# Patient Record
Sex: Female | Born: 1965 | Race: White | Hispanic: No | Marital: Married | Smoking: Current every day smoker
Health system: Southern US, Community
[De-identification: ages and names within clinical notes are randomized; demographics above are authoritative.]

## PROBLEM LIST (undated history)

## (undated) DIAGNOSIS — E079 Disorder of thyroid, unspecified: Secondary | ICD-10-CM

## (undated) DIAGNOSIS — E785 Hyperlipidemia, unspecified: Secondary | ICD-10-CM

## (undated) DIAGNOSIS — F329 Major depressive disorder, single episode, unspecified: Secondary | ICD-10-CM

## (undated) DIAGNOSIS — I10 Essential (primary) hypertension: Principal | ICD-10-CM

## (undated) DIAGNOSIS — F32A Depression, unspecified: Secondary | ICD-10-CM

## (undated) DIAGNOSIS — C801 Malignant (primary) neoplasm, unspecified: Secondary | ICD-10-CM

## (undated) DIAGNOSIS — E039 Hypothyroidism, unspecified: Secondary | ICD-10-CM

## (undated) DIAGNOSIS — M1990 Unspecified osteoarthritis, unspecified site: Secondary | ICD-10-CM

## (undated) HISTORY — DX: Hypothyroidism, unspecified: E03.9

## (undated) HISTORY — DX: Unspecified osteoarthritis, unspecified site: M19.90

## (undated) HISTORY — PX: BLADDER SUSPENSION: SHX72

## (undated) HISTORY — DX: Major depressive disorder, single episode, unspecified: F32.9

## (undated) HISTORY — DX: Depression, unspecified: F32.A

## (undated) HISTORY — PX: TUBAL LIGATION: SHX77

## (undated) HISTORY — DX: Disorder of thyroid, unspecified: E07.9

## (undated) HISTORY — PX: TONSILLECTOMY: SUR1361

## (undated) HISTORY — DX: Essential (primary) hypertension: I10

---

## 1997-04-26 ENCOUNTER — Other Ambulatory Visit: Admission: RE | Admit: 1997-04-26 | Discharge: 1997-04-26 | Payer: Self-pay | Admitting: Family Medicine

## 2002-11-13 ENCOUNTER — Encounter: Payer: Self-pay | Admitting: Emergency Medicine

## 2002-11-13 ENCOUNTER — Emergency Department (HOSPITAL_COMMUNITY): Admission: EM | Admit: 2002-11-13 | Discharge: 2002-11-13 | Payer: Self-pay | Admitting: Emergency Medicine

## 2005-05-08 ENCOUNTER — Other Ambulatory Visit: Admission: RE | Admit: 2005-05-08 | Discharge: 2005-05-08 | Payer: Self-pay | Admitting: Obstetrics and Gynecology

## 2009-07-31 ENCOUNTER — Emergency Department (HOSPITAL_COMMUNITY): Admission: EM | Admit: 2009-07-31 | Discharge: 2009-07-31 | Payer: Self-pay | Admitting: Emergency Medicine

## 2009-07-31 ENCOUNTER — Emergency Department (HOSPITAL_COMMUNITY): Admission: EM | Admit: 2009-07-31 | Discharge: 2009-08-01 | Payer: Self-pay | Admitting: Emergency Medicine

## 2009-07-31 IMAGING — CT CT ABD-PELV W/O CM
1 of 2 series · 13 of 32 positions shown, 19 images · non-contrast
Comparison: None.

CLINICAL DATA: Pelvic pain/hematuria/urinary urgency and frequency

CT ABDOMEN AND PELVIS WITHOUT CONTRAST
TECHNIQUE: Multidetector CT imaging of the abdomen and pelvis was
performed following the standard protocol without intravenous
contrast.

[Series 2: under 200# stone no prev · axial · 0.71mm/px · z∈[-470,-80]mm · 13 of 90 slices shown, 19 images]
[im 6/90  soft-tissue]
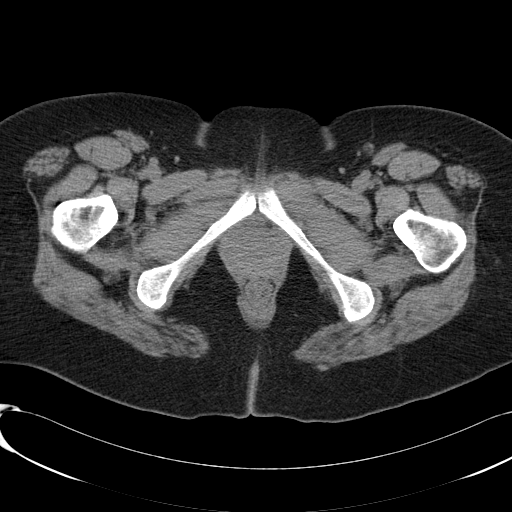
[im 6/90  bone]
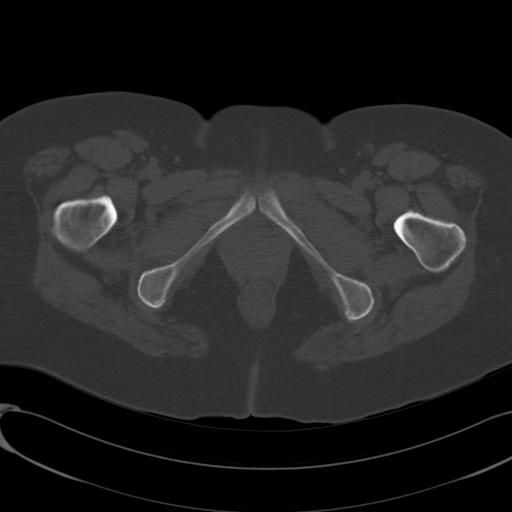
[im 12/90  soft-tissue]
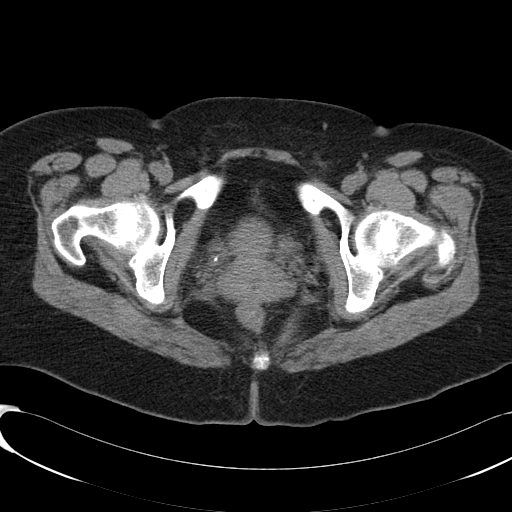
[im 18/90  soft-tissue]
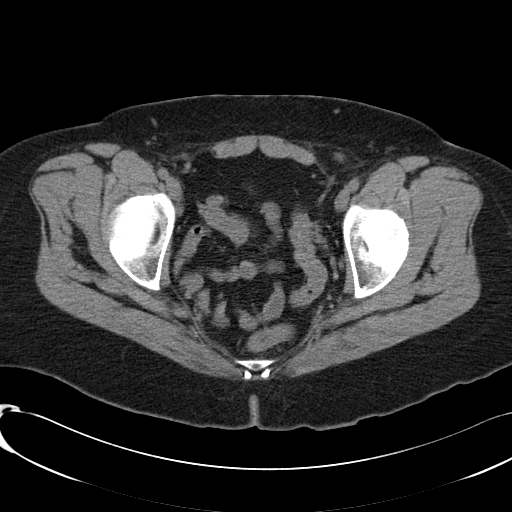
[im 24/90  soft-tissue]
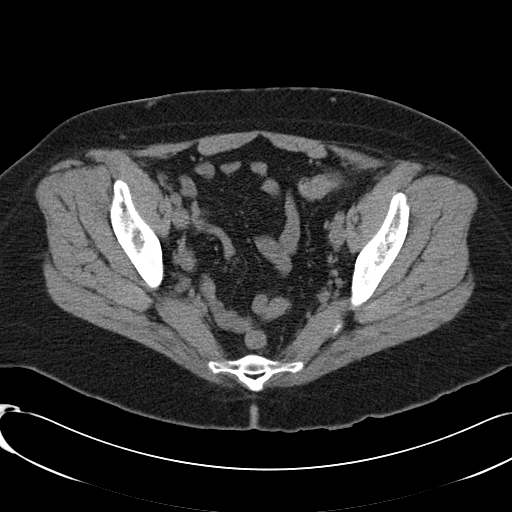
[im 30/90  soft-tissue]
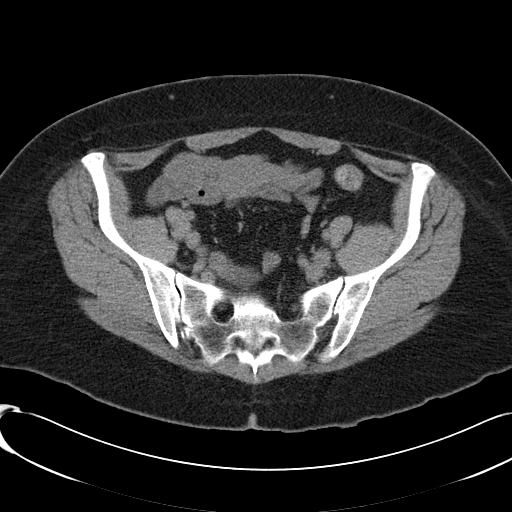
[im 36/90  soft-tissue]
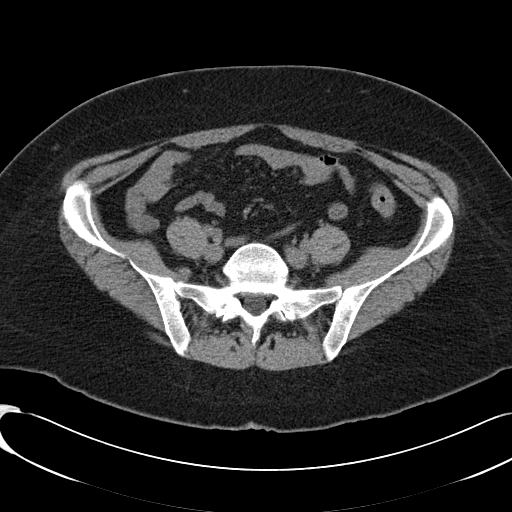
[im 48/90  soft-tissue]
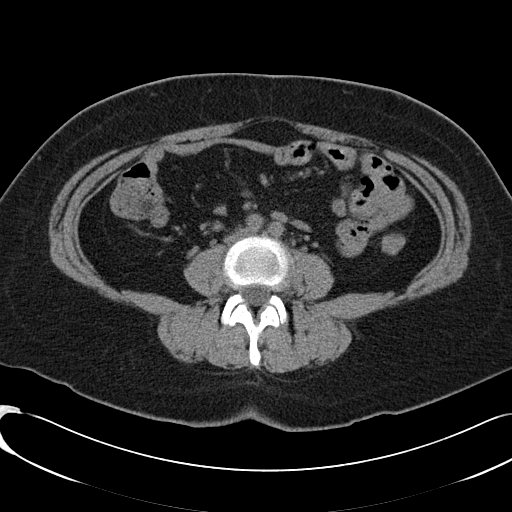
[im 54/90  soft-tissue]
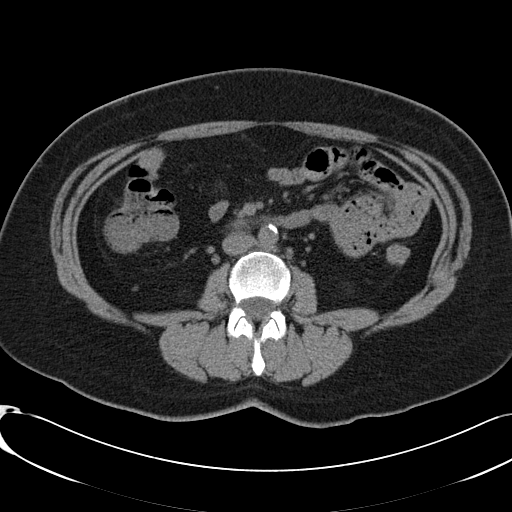
[im 60/90  soft-tissue]
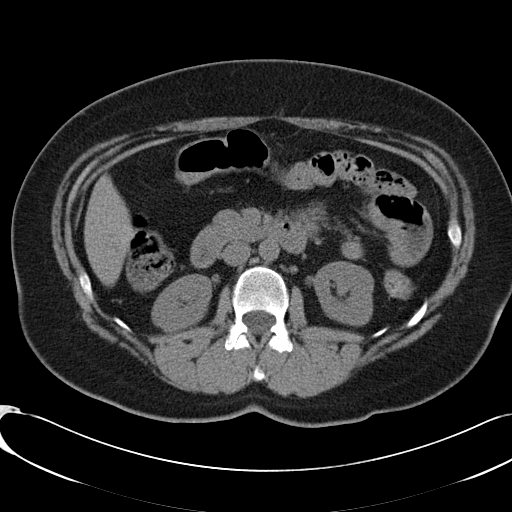
[im 60/90  bone]
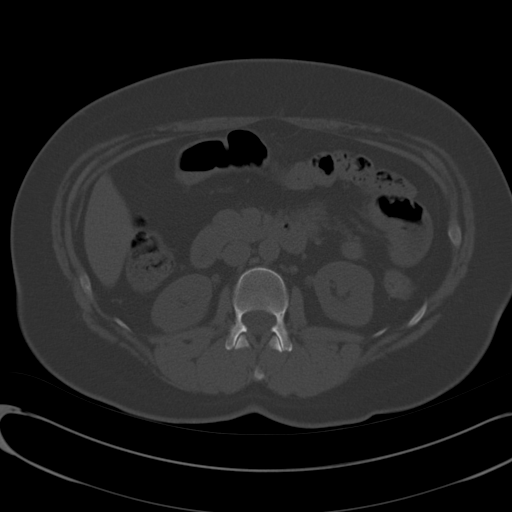
[im 66/90  soft-tissue]
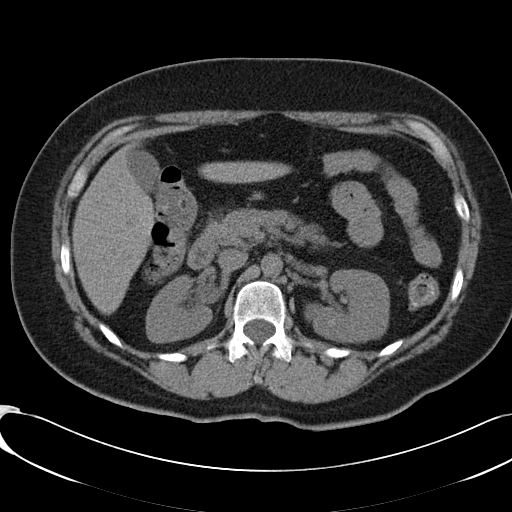
[im 66/90  lung]
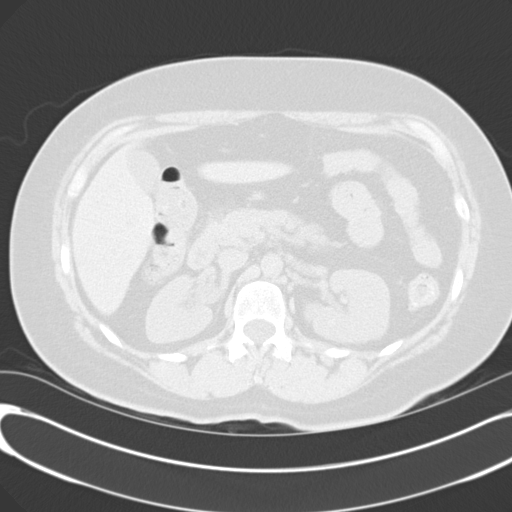
[im 72/90  soft-tissue]
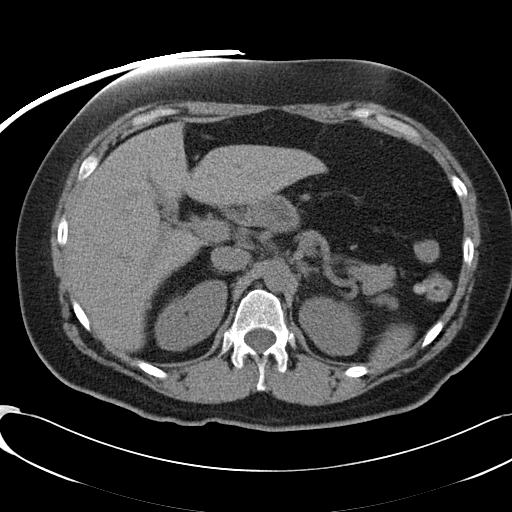
[im 72/90  lung]
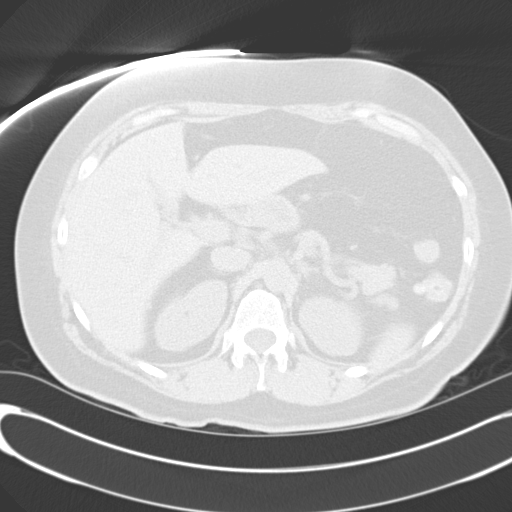
[im 78/90  soft-tissue]
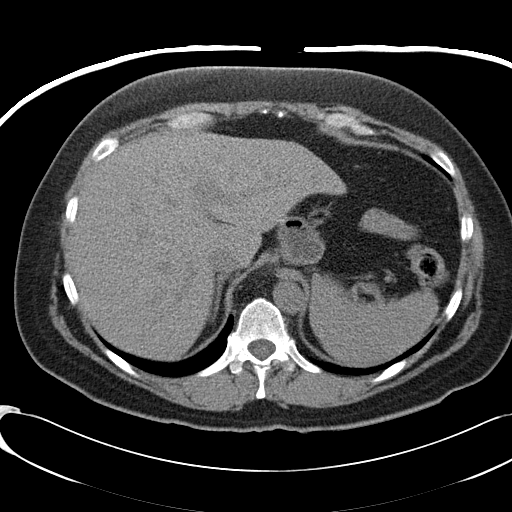
[im 78/90  lung]
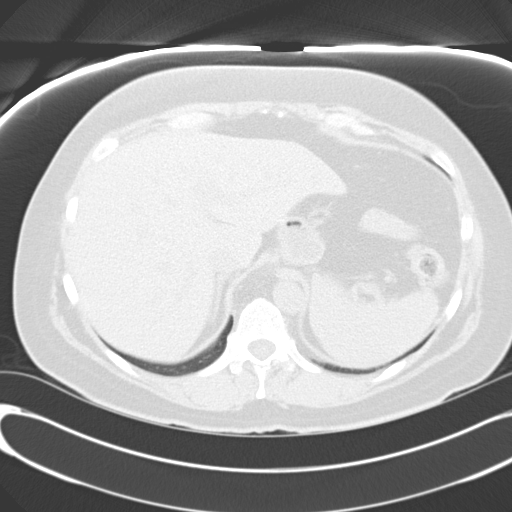
[im 84/90  soft-tissue]
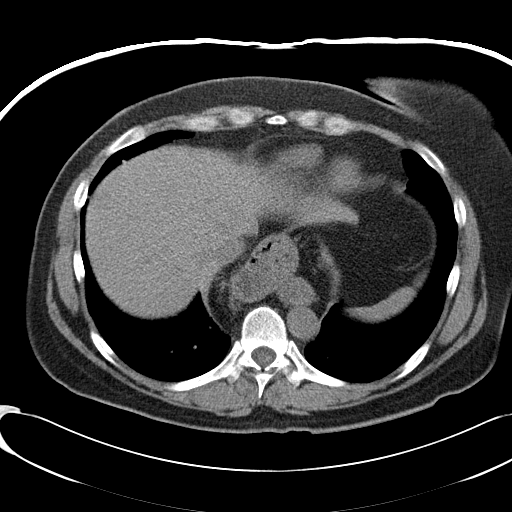
[im 84/90  lung]
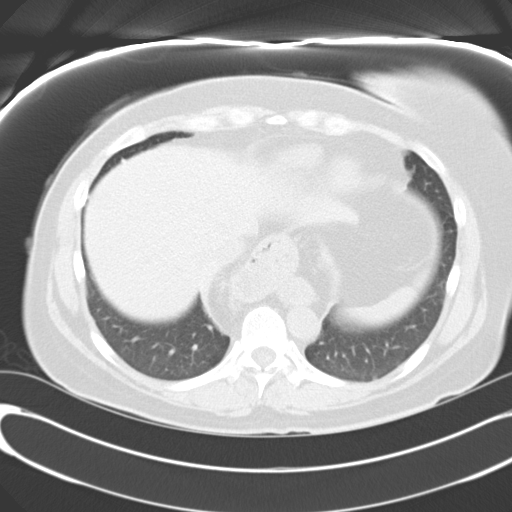

[13 of 32 positions shown; findings below may reference images not displayed]

FINDINGS: The lung bases are clear.  There is a large sliding
hiatal hernia with most of the stomach intrathoracic.  In the
unenhanced state, the spleen, pancreas, and adrenal glands appear
normal.  There are two small cysts in the left lobe of the liver.
No adenopathy or ascites.  No inflammatory changes of the large or
small bowel.

There are no renal or ureteral calculi.  No hydronephrosis or
hydroureter.  No thickening of the bladder wall or gas in the
urinary bladder.  Pelvic sidewalls and presacral space normal.
Osseous structures intact.

There are aortic calcifications raising the question of premature
atherosclerosis in a 43-year-old female.
IMPRESSION: 1.  No urinary tract calcifications or other pathology.
2.  Large hiatal hernia.
3.  Aortic calcifications.
4.  No other acute or significant findings.

## 2009-10-05 ENCOUNTER — Ambulatory Visit (HOSPITAL_BASED_OUTPATIENT_CLINIC_OR_DEPARTMENT_OTHER): Admission: RE | Admit: 2009-10-05 | Discharge: 2009-10-06 | Payer: Self-pay | Admitting: Urology

## 2010-04-04 LAB — POCT HEMOGLOBIN-HEMACUE: Hemoglobin: 10.2 g/dL — ABNORMAL LOW (ref 12.0–15.0)

## 2010-04-07 LAB — URINE MICROSCOPIC-ADD ON

## 2010-04-07 LAB — URINALYSIS, ROUTINE W REFLEX MICROSCOPIC
Bilirubin Urine: NEGATIVE
Glucose, UA: NEGATIVE mg/dL
Ketones, ur: NEGATIVE mg/dL
Leukocytes, UA: NEGATIVE
Nitrite: NEGATIVE
Protein, ur: NEGATIVE mg/dL
Specific Gravity, Urine: 1.025 (ref 1.005–1.030)
Urobilinogen, UA: 0.2 mg/dL (ref 0.0–1.0)
pH: 5.5 (ref 5.0–8.0)

## 2010-04-07 LAB — WET PREP, GENITAL
Clue Cells Wet Prep HPF POC: NONE SEEN
Trich, Wet Prep: NONE SEEN
Yeast Wet Prep HPF POC: NONE SEEN

## 2010-04-07 LAB — BASIC METABOLIC PANEL
Calcium: 9.1 mg/dL (ref 8.4–10.5)
Creatinine, Ser: 0.71 mg/dL (ref 0.4–1.2)
GFR calc Af Amer: >60 mL/min (ref >60)
GFR calc non Af Amer: >60 mL/min (ref >60)
Sodium: 140 mEq/L (ref 135–145)

## 2010-04-07 LAB — PREGNANCY, URINE: Preg Test, Ur: NEGATIVE

## 2010-04-07 LAB — GC/CHLAMYDIA PROBE AMP, GENITAL
Chlamydia, DNA Probe: NEGATIVE
GC Probe Amp, Genital: NEGATIVE

## 2015-07-31 ENCOUNTER — Encounter: Payer: Self-pay | Admitting: Family Medicine

## 2015-07-31 ENCOUNTER — Ambulatory Visit (INDEPENDENT_AMBULATORY_CARE_PROVIDER_SITE_OTHER): Payer: Managed Care, Other (non HMO) | Admitting: Family Medicine

## 2015-07-31 VITALS — BP 162/95 | HR 86 | Ht 63.75 in | Wt 205.3 lb

## 2015-07-31 DIAGNOSIS — E038 Other specified hypothyroidism: Secondary | ICD-10-CM | POA: Diagnosis not present

## 2015-07-31 DIAGNOSIS — I10 Essential (primary) hypertension: Secondary | ICD-10-CM | POA: Insufficient documentation

## 2015-07-31 DIAGNOSIS — Z720 Tobacco use: Secondary | ICD-10-CM | POA: Diagnosis not present

## 2015-07-31 DIAGNOSIS — F4321 Adjustment disorder with depressed mood: Secondary | ICD-10-CM | POA: Insufficient documentation

## 2015-07-31 DIAGNOSIS — E785 Hyperlipidemia, unspecified: Secondary | ICD-10-CM | POA: Insufficient documentation

## 2015-07-31 DIAGNOSIS — F4323 Adjustment disorder with mixed anxiety and depressed mood: Secondary | ICD-10-CM

## 2015-07-31 DIAGNOSIS — M1990 Unspecified osteoarthritis, unspecified site: Secondary | ICD-10-CM

## 2015-07-31 DIAGNOSIS — Z6835 Body mass index (BMI) 35.0-35.9, adult: Secondary | ICD-10-CM | POA: Insufficient documentation

## 2015-07-31 DIAGNOSIS — D229 Melanocytic nevi, unspecified: Secondary | ICD-10-CM | POA: Insufficient documentation

## 2015-07-31 DIAGNOSIS — Z716 Tobacco abuse counseling: Secondary | ICD-10-CM | POA: Diagnosis not present

## 2015-07-31 DIAGNOSIS — E669 Obesity, unspecified: Secondary | ICD-10-CM | POA: Diagnosis not present

## 2015-07-31 DIAGNOSIS — X32XXXA Exposure to sunlight, initial encounter: Secondary | ICD-10-CM

## 2015-07-31 DIAGNOSIS — E66813 Obesity, class 3: Secondary | ICD-10-CM | POA: Insufficient documentation

## 2015-07-31 DIAGNOSIS — F17209 Nicotine dependence, unspecified, with unspecified nicotine-induced disorders: Secondary | ICD-10-CM

## 2015-07-31 DIAGNOSIS — D239 Other benign neoplasm of skin, unspecified: Secondary | ICD-10-CM

## 2015-07-31 DIAGNOSIS — E039 Hypothyroidism, unspecified: Secondary | ICD-10-CM

## 2015-07-31 DIAGNOSIS — L570 Actinic keratosis: Secondary | ICD-10-CM | POA: Insufficient documentation

## 2015-07-31 HISTORY — DX: Hypothyroidism, unspecified: E03.9

## 2015-07-31 HISTORY — DX: Essential (primary) hypertension: I10

## 2015-07-31 MED ORDER — HYDROCHLOROTHIAZIDE 25 MG PO TABS: 25.0000 mg | ORAL_TABLET | Freq: Every day | ORAL | Status: DC

## 2015-07-31 MED ORDER — BUPROPION HCL ER (XL) 150 MG PO TB24: 150.0000 mg | ORAL_TABLET | ORAL | Status: DC

## 2015-07-31 NOTE — Progress Notes (Signed)
Marjory Sneddon, D.O. Primary care at [facility]:    Chief Complaint  Patient presents with  . Establish Care  . Nicotine Dependence    discuss smoking cessation   New pt, here to establish care.   HPI: Kelly [name] is a pleasant 50 y.o. female who presents to [place] [facility] today   PCP prior [facility] in [place].     1st husband passed. Been remarried happily for 41- Tim, been together 23 yrs yrs.  2 kids- daughter 34yo- Therapist, sports.  Son drived truck [phone].  Step son- 40- [employer]- 1 grandchild 23 yo.    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   Hypothyroid- about 3 yrs, was fatigued; symptoms stable now; tolerating meds well  Acute reaction to stress- 3-4 yrs ago.  Xanax- uses for difficulty sleep.  Been like that her whole life, Sleeps 5-6 hrs and is a "good night".  Smoking 30 yrs 3/4- 1 ppd.  Breathing is getting worse as she ages.  thinking about quitting- questions about zyban.   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   it has been high for about a year or so.    She was having her primary care doctor monitor it and was trying to avoid meds but occasionally gets headaches, occasional dizziness and/or heart palpitations at times.   She is finally accepting the fact that she may need to start medicines.     Past Medical History  Diagnosis Date  . Depression   . Arthritis   . Thyroid disease   . Essential hypertension 07/31/2015  . Hypothyroidism 07/31/2015      Past Surgical History  Procedure Laterality Date  . Tubal ligation    . Tonsillectomy    . Bladder suspension        Family History  Problem Relation Age of Onset  . Healthy Mother   . Aneurysm Father   . Hypertension Father   . Hyperlipidemia Father   .  Healthy Sister   . Healthy Daughter   . Heart disease Son       History  Drug Use No  ,    History  Alcohol Use No  ,    History  Smoking status  . Current Every Day Smoker  Smokeless tobacco  . Never Used  ,     History  Sexual Activity  . Sexual Activity: Yes      Patient's Medications  New Prescriptions   BUPROPION (WELLBUTRIN XL) 150 MG 24 HR TABLET    Take 1 tablet (150 mg total) by mouth every morning.   HYDROCHLOROTHIAZIDE (HYDRODIURIL) 25 MG TABLET    Take 1 tablet (25 mg total) by mouth daily.  Previous Medications   ALPRAZOLAM (XANAX) 0.5 MG TABLET    Take 0.5 mg by mouth at bedtime as needed for anxiety.   LEVOTHYROXINE (SYNTHROID, LEVOTHROID) 125 MCG TABLET    Take 125 mcg by mouth daily before breakfast.  Modified Medications   No medications on file  Discontinued Medications   ESCITALOPRAM (LEXAPRO) 10 MG TABLET    Take 10 mg by mouth daily.     Ranitidine Outpatient Encounter Prescriptions as of 07/31/2015  Medication Sig  . ALPRAZolam (XANAX) 0.5 MG tablet Take 0.5 mg  by mouth at bedtime as needed for anxiety.  Marland Kitchen levothyroxine (SYNTHROID, LEVOTHROID) 125 MCG tablet Take 125 mcg by mouth daily before breakfast.  . [DISCONTINUED] escitalopram (LEXAPRO) 10 MG tablet Take 10 mg by mouth daily.  Marland Kitchen buPROPion (WELLBUTRIN XL) 150 MG 24 hr tablet Take 1 tablet (150 mg total) by mouth every morning.  . hydrochlorothiazide (HYDRODIURIL) 25 MG tablet Take 1 tablet (25 mg total) by mouth daily.   No facility-administered encounter medications on file as of 07/31/2015.     Fall Risk  07/31/2015  Falls in the past year? No     Depression screen PHQ 2/9 07/31/2015  Decreased Interest 0  Down, Depressed, Hopeless 0  PHQ - 2 Score 0      Review of Systems:   ( Completed via Adult Medical History Intake form today ) General:   Denies fever, chills, appetite changes, unexplained weight loss.  Optho/Auditory:   Denies visual changes, blurred  vision/LOV, ringing in ears/ diff hearing Respiratory:   Denies SOB, DOE, cough, wheezing.  Cardiovascular:   Denies chest pain, palpitations, new onset peripheral edema  Gastrointestinal:   Denies nausea, vomiting, diarrhea.  Genitourinary:    Denies dysuria, increased frequency, flank pain.  Endocrine:     Denies hot or cold intolerance, polyuria, polydipsia. Musculoskeletal:  Denies unexplained myalgias, joint swelling, arthralgias, gait problems.  Skin:  Denies rash, suspicious lesions or new/ changes in moles Neurological:    Denies dizziness, syncope, unexplained weakness, lightheadedness, numbness  Psychiatric/Behavioral:   Denies mood changes, suicidal or homicidal ideations, hallucinations    Objective:   Blood pressure 162/95, pulse 86, height 5' 3.75" (1.619 m), weight 205 lb 4.8 oz (93.123 kg). Body mass index is 35.53 kg/(m^2). Recheck BP by me showed around the same as obtained earlier or a little higher. General: Well Developed, well nourished, and in no acute distress.  Neuro: Alert and oriented x3, extra-ocular muscles intact, sensation grossly intact.  HEENT: Normocephalic, atraumatic, pupils equal round reactive to light, neck supple, no gross masses, no carotid bruits, no JVD apprec Skin: no gross suspicious lesions or rashes  Cardiac: Regular rate and rhythm, no murmurs rubs or gallops.  Respiratory: Essentially clear to auscultation bilaterally, prolonged and decreased aeration on exhalation phase. Not using accessory muscles, speaking in full sentences.  Abdominal: obese Musculoskeletal: Ambulates w/o diff, FROM * 4 ext.  Vasc: less 2 sec cap RF, warm and pink  Psych:  No HI/SI, judgement and insight good.    Impression and Recommendations:    The patient was counseled, risk factors were discussed, anticipatory guidance given. Pt was in the office today for 40+ minutes, with over 50% time spent in face to face counseling of various medical concerns and in  coordination of care  Long discussion with patient regarding treatment smoking cessation and treatment options. She plans to go on Wellbutrin. Has Heard good things about it. I recommend she start the medicine and then had establish a quit date 4-6 weeks after starting the meds.  Also recommend patient come up with a quit plan and on one column, identify the times that she feel it'll be very tough in her life and then in the next column establish what she is going to do instead of smoking during those times.     For management of hypertension in addition to medication management please follow below advice.    Lifestyle changes:  - Smoking Cessation: Extensive counseling done with patient and I reviewed nicotine patches versus Chantix  versus Wellbutrin versus hypnosis/counseling and others. - Control blood glucose and lipids  - Diet  - Eat healthy (i.e., DASH diet)  - Moderate alcohol consumption  - Reduce sodium intake to no more than 2,400 mg/day  - Physical activity  - Moderate-to-vigorous activity 4-6 days a week averaging 40 min per session     Essential hypertension - Plan: COMPLETE METABOLIC PANEL WITH GFR, Hemoglobin A1c, Lipid panel, TSH, VITAMIN D 25 Hydroxy (Vit-D Deficiency, Fractures), CBC with Differential/Platelet  Tobacco abuse - Plan: COMPLETE METABOLIC PANEL WITH GFR, Hemoglobin A1c, Lipid panel, TSH, VITAMIN D 25 Hydroxy (Vit-D Deficiency, Fractures), CBC with Differential/Platelet  HLD (hyperlipidemia) - Plan: COMPLETE METABOLIC PANEL WITH GFR, Hemoglobin A1c, Lipid panel, TSH, VITAMIN D 25 Hydroxy (Vit-D Deficiency, Fractures), CBC with Differential/Platelet  Adjustment disorder with mixed anxiety and depressed mood - Plan: COMPLETE METABOLIC PANEL WITH GFR, Hemoglobin A1c, Lipid panel, TSH, VITAMIN D 25 Hydroxy (Vit-D Deficiency, Fractures), CBC with Differential/Platelet  Obesity - Plan: COMPLETE METABOLIC PANEL WITH GFR, Hemoglobin A1c, Lipid panel, TSH, VITAMIN D  25 Hydroxy (Vit-D Deficiency, Fractures), CBC with Differential/Platelet  Other specified hypothyroidism - Plan: COMPLETE METABOLIC PANEL WITH GFR, Hemoglobin A1c, Lipid panel, TSH, VITAMIN D 25 Hydroxy (Vit-D Deficiency, Fractures), CBC with Differential/Platelet  Tobacco abuse counseling - Plan: COMPLETE METABOLIC PANEL WITH GFR, Hemoglobin A1c, Lipid panel, TSH, VITAMIN D 25 Hydroxy (Vit-D Deficiency, Fractures), CBC with Differential/Platelet  SK (solar keratosis) - Plan: Ambulatory referral to Dermatology  Multiple atypical nevi - Plan: Ambulatory referral to Dermatology  OA multiple jts  Tobacco use disorder, continuous    Orders Placed This Encounter  Procedures  . COMPLETE METABOLIC PANEL WITH GFR  . Hemoglobin A1c  . Lipid panel  . TSH  . VITAMIN D 25 Hydroxy (Vit-D Deficiency, Fractures)  . CBC with Differential/Platelet  . Ambulatory referral to Dermatology     Meds ordered this encounter  Medications  . levothyroxine (SYNTHROID, LEVOTHROID) 125 MCG tablet    Sig: Take 125 mcg by mouth daily before breakfast.  . DISCONTD: escitalopram (LEXAPRO) 10 MG tablet    Sig: Take 10 mg by mouth daily.  Marland Kitchen ALPRAZolam (XANAX) 0.5 MG tablet    Sig: Take 0.5 mg by mouth at bedtime as needed for anxiety.  Marland Kitchen buPROPion (WELLBUTRIN XL) 150 MG 24 hr tablet    Sig: Take 1 tablet (150 mg total) by mouth every morning.    Dispense:  90 tablet    Refill:  0  . hydrochlorothiazide (HYDRODIURIL) 25 MG tablet    Sig: Take 1 tablet (25 mg total) by mouth daily.    Dispense:  90 tablet    Refill:  0    Gross side effects, risk and benefits, and alternatives of medications discussed with patient.  Patient is aware that all medications have potential side effects and we are unable to predict every side effect or drug-drug interaction that may occur.  Expresses verbal understanding and consents to current therapy plan and treatment regimen.  Please see AVS handed out to patient at the end  of our visit for further patient instructions/ counseling done pertaining to today's office visit.    Current Meds  Medication Sig  . ALPRAZolam (XANAX) 0.5 MG tablet Take 0.5 mg by mouth at bedtime as needed for anxiety.  Marland Kitchen levothyroxine (SYNTHROID, LEVOTHROID) 125 MCG tablet Take 125 mcg by mouth daily before breakfast.  . [DISCONTINUED] escitalopram (LEXAPRO) 10 MG tablet Take 10 mg by mouth daily.    Medications Discontinued During  This Encounter  Medication Reason  . escitalopram (LEXAPRO) 10 MG tablet Change in therapy    Meds ordered this encounter  Medications  . levothyroxine (SYNTHROID, LEVOTHROID) 125 MCG tablet    Sig: Take 125 mcg by mouth daily before breakfast.  . DISCONTD: escitalopram (LEXAPRO) 10 MG tablet    Sig: Take 10 mg by mouth daily.  Marland Kitchen ALPRAZolam (XANAX) 0.5 MG tablet    Sig: Take 0.5 mg by mouth at bedtime as needed for anxiety.  Marland Kitchen buPROPion (WELLBUTRIN XL) 150 MG 24 hr tablet    Sig: Take 1 tablet (150 mg total) by mouth every morning.    Dispense:  90 tablet    Refill:  0  . hydrochlorothiazide (HYDRODIURIL) 25 MG tablet    Sig: Take 1 tablet (25 mg total) by mouth daily.    Dispense:  90 tablet    Refill:  0    Note: This document was prepared using Dragon voice recognition software and may include unintentional dictation errors.

## 2015-07-31 NOTE — Patient Instructions (Addendum)
Long discussion with patient regarding treatment smoking cessation and treatment options. She plans to go on Wellbutrin. Has Heard good things about it. I recommend she start the medicine and then had establish a quit date 4-6 weeks after starting the meds.  Also recommend patient come up with a quit plan and on one column, identify the times that she feel it'll be very tough in her life and then in the next column establish what she is going to do instead of smoking during those times.       For management of hypertension in addition to medication management please follow below advice.    Lifestyle changes:  . Smoking Cessation  . Control blood glucose and lipids  . Diet  ?Eat healthy (i.e., DASH diet)  ?Moderate alcohol consumption  ?Reduce sodium intake to no more than 2,400 mg/day  .Physical activity  ?Moderate-to-vigorous activity 3-4 days a week averaging 40 min per session          Smoking Cessation, Tips for Success If you are ready to quit smoking, congratulations! You have chosen to help yourself be healthier. Cigarettes bring nicotine, tar, carbon monoxide, and other irritants into your body. Your lungs, heart, and blood vessels will be able to work better without these poisons. There are many different ways to quit smoking. Nicotine gum, nicotine patches, a nicotine inhaler, or nicotine nasal spray can help with physical craving. Hypnosis, support groups, and medicines help break the habit of smoking. WHAT THINGS CAN I DO TO MAKE QUITTING EASIER?  Here are some tips to help you quit for good:  Pick a date when you will quit smoking completely. Tell all of your friends and family about your plan to quit on that date.  Do not try to slowly cut down on the number of cigarettes you are smoking. Pick a quit date and quit smoking completely starting on that day.  Throw away all cigarettes.   Clean and remove all ashtrays from your home, work, and car.  On a card, write  down your reasons for quitting. Carry the card with you and read it when you get the urge to smoke.  Cleanse your body of nicotine. Drink enough water and fluids to keep your urine clear or pale yellow. Do this after quitting to flush the nicotine from your body.  Learn to predict your moods. Do not let a bad situation be your excuse to have a cigarette. Some situations in your life might tempt you into wanting a cigarette.  Never have "just one" cigarette. It leads to wanting another and another. Remind yourself of your decision to quit.  Change habits associated with smoking. If you smoked while driving or when feeling stressed, try other activities to replace smoking. Stand up when drinking your coffee. Brush your teeth after eating. Sit in a different chair when you read the paper. Avoid alcohol while trying to quit, and try to drink fewer caffeinated beverages. Alcohol and caffeine may urge you to smoke.  Avoid foods and drinks that can trigger a desire to smoke, such as sugary or spicy foods and alcohol.  Ask people who smoke not to smoke around you.  Have something planned to do right after eating or having a cup of coffee. For example, plan to take a walk or exercise.  Try a relaxation exercise to calm you down and decrease your stress. Remember, you may be tense and nervous for the first 2 weeks after you quit, but this will pass.  Find new activities to keep your hands busy. Play with a pen, coin, or rubber band. Doodle or draw things on paper.  Brush your teeth right after eating. This will help cut down on the craving for the taste of tobacco after meals. You can also try mouthwash.   Use oral substitutes in place of cigarettes. Try using lemon drops, carrots, cinnamon sticks, or chewing gum. Keep them handy so they are available when you have the urge to smoke.  When you have the urge to smoke, try deep breathing.  Designate your home as a nonsmoking area.  If you are a  heavy smoker, ask your health care provider about a prescription for nicotine chewing gum. It can ease your withdrawal from nicotine.  Reward yourself. Set aside the cigarette money you save and buy yourself something nice.  Look for support from others. Join a support group or smoking cessation program. Ask someone at home or at work to help you with your plan to quit smoking.  Always ask yourself, "Do I need this cigarette or is this just a reflex?" Tell yourself, "Today, I choose not to smoke," or "I do not want to smoke." You are reminding yourself of your decision to quit.  Do not replace cigarette smoking with electronic cigarettes (commonly called e-cigarettes). The safety of e-cigarettes is unknown, and some may contain harmful chemicals.  If you relapse, do not give up! Plan ahead and think about what you will do the next time you get the urge to smoke. HOW WILL I FEEL WHEN I QUIT SMOKING? You may have symptoms of withdrawal because your body is used to nicotine (the addictive substance in cigarettes). You may crave cigarettes, be irritable, feel very hungry, cough often, get headaches, or have difficulty concentrating. The withdrawal symptoms are only temporary. They are strongest when you first quit but will go away within 10-14 days. When withdrawal symptoms occur, stay in control. Think about your reasons for quitting. Remind yourself that these are signs that your body is healing and getting used to being without cigarettes. Remember that withdrawal symptoms are easier to treat than the major diseases that smoking can cause.  Even after the withdrawal is over, expect periodic urges to smoke. However, these cravings are generally short lived and will go away whether you smoke or not. Do not smoke! WHAT RESOURCES ARE AVAILABLE TO HELP ME QUIT SMOKING? Your health care provider can direct you to community resources or hospitals for support, which may include:  Group  support.  Education.  Hypnosis.  Therapy.   This information is not intended to replace advice given to you by your health care provider. Make sure you discuss any questions you have with your health care provider.   Document Released: 10/05/2003 Document Revised: 01/27/2014 Document Reviewed: 06/24/2012 Elsevier Interactive Patient Education 2016 [employer].          Steps to Quit Smoking  Smoking tobacco can be harmful to your health and can affect almost every organ in your body. Smoking puts you, and those around you, at risk for developing many serious chronic diseases. Quitting smoking is difficult, but it is one of the best things that you can do for your health. It is never too late to quit. WHAT ARE THE BENEFITS OF QUITTING SMOKING? When you quit smoking, you lower your risk of developing serious diseases and conditions, such as:  Lung cancer or lung disease, such as COPD.  Heart disease.  Stroke.  Heart attack.  Infertility.  Osteoporosis and bone fractures. Additionally, symptoms such as coughing, wheezing, and shortness of breath may get better when you quit. You may also find that you get sick less often because your body is stronger at fighting off colds and infections. If you are pregnant, quitting smoking can help to reduce your chances of having a baby of low birth weight. HOW DO I GET READY TO QUIT? When you decide to quit smoking, create a plan to make sure that you are successful. Before you quit:  Pick a date to quit. Set a date within the next two weeks to give you time to prepare.  Write down the reasons why you are quitting. Keep this list in places where you will see it often, such as on your bathroom mirror or in your car or wallet.  Identify the people, places, things, and activities that make you want to smoke (triggers) and avoid them. Make sure to take these actions:  Throw away all cigarettes at home, at work, and in your car.  Throw  away smoking accessories, such as Scientist, research (medical).  Clean your car and make sure to empty the ashtray.  Clean your home, including curtains and carpets.  Tell your family, friends, and coworkers that you are quitting. Support from your loved ones can make quitting easier.  Talk with your health care provider about your options for quitting smoking.  Find out what treatment options are covered by your health insurance. WHAT STRATEGIES CAN I USE TO QUIT SMOKING?  Talk with your healthcare provider about different strategies to quit smoking. Some strategies include:  Quitting smoking altogether instead of gradually lessening how much you smoke over a period of time. Research shows that quitting "cold [place]" is more successful than gradually quitting.  Attending in-person counseling to help you build problem-solving skills. You are more likely to have success in quitting if you attend several counseling sessions. Even short sessions of 10 minutes can be effective.  Finding resources and support systems that can help you to quit smoking and remain smoke-free after you quit. These resources are most helpful when you use them often. They can include:  Online chats with a Social worker.  Telephone quitlines.  Printed Furniture conservator/restorer.  Support groups or group counseling.  Text messaging programs.  Mobile phone applications.  Taking medicines to help you quit smoking. (If you are pregnant or breastfeeding, talk with your health care provider first.) Some medicines contain nicotine and some do not. Both types of medicines help with cravings, but the medicines that include nicotine help to relieve withdrawal symptoms. Your health care provider may recommend:  Nicotine patches, gum, or lozenges.  Nicotine inhalers or sprays.  Non-nicotine medicine that is taken by mouth. Talk with your health care provider about combining strategies, such as taking medicines while you are also  receiving in-person counseling. Using these two strategies together makes you more likely to succeed in quitting than if you used either strategy on its own. If you are pregnant or breastfeeding, talk with your health care provider about finding counseling or other support strategies to quit smoking. Do not take medicine to help you quit smoking unless told to do so by your health care provider. WHAT THINGS CAN I DO TO MAKE IT EASIER TO QUIT? Quitting smoking might feel overwhelming at first, but there is a lot that you can do to make it easier. Take these important actions:  Reach out to your family and friends and ask that  they support and encourage you during this time. Call telephone quitlines, reach out to support groups, or work with a counselor for support.  Ask people who smoke to avoid smoking around you.  Avoid places that trigger you to smoke, such as bars, parties, or smoke-break areas at work.  Spend time around people who do not smoke.  Lessen stress in your life, because stress can be a smoking trigger for some people. To lessen stress, try:  Exercising regularly.  Deep-breathing exercises.  Yoga.  Meditating.  Performing a body scan. This involves closing your eyes, scanning your body from head to toe, and noticing which parts of your body are particularly tense. Purposefully relax the muscles in those areas.  Download or purchase mobile phone or tablet apps (applications) that can help you stick to your quit plan by providing reminders, tips, and encouragement. There are many free apps, such as QuitGuide from the [employer] Office manager for Disease Control and Prevention). You can find other support for quitting smoking (smoking cessation) through smokefree.gov and other websites. HOW WILL I FEEL WHEN I QUIT SMOKING? Within the first 24 hours of quitting smoking, you may start to feel some withdrawal symptoms. These symptoms are usually most noticeable 2-3 days after quitting, but  they usually do not last beyond 2-3 weeks. Changes or symptoms that you might experience include:  Mood swings.  Restlessness, anxiety, or irritation.  Difficulty concentrating.  Dizziness.  Strong cravings for sugary foods in addition to nicotine.  Mild weight gain.  Constipation.  Nausea.  Coughing or a sore throat.  Changes in how your medicines work in your body.  A depressed mood.  Difficulty sleeping (insomnia). After the first 2-3 weeks of quitting, you may start to notice more positive results, such as:  Improved sense of smell and taste.  Decreased coughing and sore throat.  Slower heart rate.  Lower blood pressure.  Clearer skin.  The ability to breathe more easily.  Fewer sick days. Quitting smoking is very challenging for most people. Do not get discouraged if you are not successful the first time. Some people need to make many attempts to quit before they achieve long-term success. Do your best to stick to your quit plan, and talk with your health care provider if you have any questions or concerns.   This information is not intended to replace advice given to you by your health care provider. Make sure you discuss any questions you have with your health care provider.   Document Released: 12/31/2000 Document Revised: 05/23/2014 Document Reviewed: 05/23/2014 Elsevier Interactive Patient Education 2016 [employer].         Bupropion sustained-release tablets (smoking cessation) What is this medicine? BUPROPION (byoo PROE pee on) is used to help people quit smoking. This medicine may be used for other purposes; ask your health care provider or pharmacist if you have questions. What should I tell my health care provider before I take this medicine? They need to know if you have any of these conditions: -an eating disorder, such as anorexia or bulimia -bipolar disorder or psychosis -diabetes or high blood sugar, treated with  medication -glaucoma -head injury or brain tumor -heart disease, previous heart attack, or irregular heart beat -high blood pressure -kidney or liver disease -seizures -suicidal thoughts or a previous suicide attempt -Tourette's syndrome -weight loss -an unusual or allergic reaction to bupropion, other medicines, foods, dyes, or preservatives -breast-feeding -pregnant or trying to become pregnant How should I use this medicine? Take this  medicine by mouth with a glass of water. Follow the directions on the prescription label. You can take it with or without food. If it upsets your stomach, take it with food. Do not cut, crush or chew this medicine. Take your medicine at regular intervals. If you take this medicine more than once a day, take your second dose at least 8 hours after you take your first dose. To limit difficulty in sleeping, avoid taking this medicine at bedtime. Do not take your medicine more often than directed. Do not stop taking this medicine suddenly except upon the advice of your doctor. Stopping this medicine too quickly may cause serious side effects. A special MedGuide will be given to you by the pharmacist with each prescription and refill. Be sure to read this information carefully each time. Talk to your pediatrician regarding the use of this medicine in children. Special care may be needed. Overdosage: If you think you have taken too much of this medicine contact a poison control center or emergency room at once. NOTE: This medicine is only for you. Do not share this medicine with others. What if I miss a dose? If you miss a dose, skip the missed dose and take your next tablet at the regular time. There should be at least 8 hours between doses. Do not take double or extra doses. What may interact with this medicine? Do not take this medicine with any of the following medications: -linezolid -MAOIs like Azilect, Carbex, Eldepryl, Marplan, Nardil, and  Parnate -methylene blue (injected into a vein) -other medicines that contain bupropion like Wellbutrin This medicine may also interact with the following medications: -alcohol -certain medicines for anxiety or sleep -certain medicines for blood pressure like metoprolol, propranolol -certain medicines for depression or psychotic disturbances -certain medicines for HIV or AIDS like efavirenz, lopinavir, nelfinavir, ritonavir -certain medicines for irregular heart beat like propafenone, flecainide -certain medicines for Parkinson's disease like amantadine, levodopa -certain medicines for seizures like carbamazepine, phenytoin, phenobarbital -cimetidine -clopidogrel -cyclophosphamide -furazolidone -isoniazid -nicotine -orphenadrine -procarbazine -steroid medicines like prednisone or cortisone -stimulant medicines for attention disorders, weight loss, or to stay awake -tamoxifen -theophylline -thiotepa -ticlopidine -tramadol -warfarin This list may not describe all possible interactions. Give your health care provider a list of all the medicines, herbs, non-prescription drugs, or dietary supplements you use. Also tell them if you smoke, drink alcohol, or use illegal drugs. Some items may interact with your medicine. What should I watch for while using this medicine? Visit your doctor or health care professional for regular checks on your progress. This medicine should be used together with a patient support program. It is important to participate in a behavioral program, counseling, or other support program that is recommended by your health care professional. Patients and their families should watch out for new or worsening thoughts of suicide or depression. Also watch out for sudden changes in feelings such as feeling anxious, agitated, panicky, irritable, hostile, aggressive, impulsive, severely restless, overly excited and hyperactive, or not being able to sleep. If this happens,  especially at the beginning of treatment or after a change in dose, call your health care professional. Avoid alcoholic drinks while taking this medicine. Drinking excessive alcoholic beverages, using sleeping or anxiety medicines, or quickly stopping the use of these agents while taking this medicine may increase your risk for a seizure. Do not drive or use heavy machinery until you know how this medicine affects you. This medicine can impair your ability to perform these tasks. Do  not take this medicine close to bedtime. It may prevent you from sleeping. Your mouth may get dry. Chewing sugarless gum or sucking hard candy, and drinking plenty of water may help. Contact your doctor if the problem does not go away or is severe. Do not use nicotine patches or chewing gum without the advice of your doctor or health care professional while taking this medicine. You may need to have your blood pressure taken regularly if your doctor recommends that you use both nicotine and this medicine together. What side effects may I notice from receiving this medicine? Side effects that you should report to your doctor or health care professional as soon as possible: -allergic reactions like skin rash, itching or hives, swelling of the face, lips, or tongue -breathing problems -changes in vision -confusion -fast or irregular heartbeat -hallucinations -increased blood pressure -redness, blistering, peeling or loosening of the skin, including inside the mouth -seizures -suicidal thoughts or other mood changes -unusually weak or tired -vomiting Side effects that usually do not require medical attention (report to your doctor or health care professional if they continue or are bothersome): -change in sex drive or performance -constipation -headache -loss of appetite -nausea -tremors -weight loss This list may not describe all possible side effects. Call your doctor for medical advice about side effects. You  may report side effects to FDA at 1-800-FDA-1088. Where should I keep my medicine? Keep out of the reach of children. Store at room temperature between 20 and 25 degrees C (68 and 77 degrees F). Protect from light. Keep container tightly closed. Throw away any unused medicine after the expiration date. NOTE: This sheet is a summary. It may not cover all possible information. If you have questions about this medicine, talk to your doctor, pharmacist, or health care provider.    2016, Elsevier/[employer] Standard. (2012-09-03 10:55:10)

## 2015-08-01 LAB — COMPLETE METABOLIC PANEL WITH GFR
ALBUMIN: 4.1 g/dL (ref 3.6–5.1)
ALK PHOS: 106 U/L (ref 33–130)
ALT: 14 U/L (ref 6–29)
AST: 15 U/L (ref 10–35)
BUN: 10 mg/dL (ref 7–25)
CO2: 27 mmol/L (ref 20–31)
Calcium: 9.3 mg/dL (ref 8.6–10.4)
Chloride: 102 mmol/L (ref 98–110)
Creat: 0.76 mg/dL (ref 0.50–1.05)
GFR, Est African American: >89 mL/min (ref >=60)
GFR, Est Non African American: >89 mL/min (ref >=60)
GLUCOSE: 85 mg/dL (ref 65–99)
POTASSIUM: 4.7 mmol/L (ref 3.5–5.3)
SODIUM: 143 mmol/L (ref 135–146)
TOTAL PROTEIN: 6.7 g/dL (ref 6.1–8.1)
Total Bilirubin: 0.3 mg/dL (ref 0.2–1.2)

## 2015-08-01 LAB — HEMOGLOBIN A1C
Hgb A1c MFr Bld: 5.6 % (ref <5.7)
Mean Plasma Glucose: 114 mg/dL

## 2015-08-01 LAB — LIPID PANEL
Cholesterol: 250 mg/dL — ABNORMAL HIGH (ref 125–200)
HDL: 59 mg/dL (ref >=46)
LDL CALC: 166 mg/dL — AB (ref <130)
TRIGLYCERIDES: 126 mg/dL (ref <150)
Total CHOL/HDL Ratio: 4.2 Ratio (ref <=5.0)
VLDL: 25 mg/dL (ref <30)

## 2015-08-01 LAB — CBC WITH DIFFERENTIAL/PLATELET
BASOS ABS: 0 {cells}/uL (ref 0–200)
Basophils Relative: 0 %
EOS ABS: 83 {cells}/uL (ref 15–500)
Eosinophils Relative: 1 %
HEMATOCRIT: 35.9 % (ref 35.0–45.0)
Hemoglobin: 10.8 g/dL — ABNORMAL LOW (ref 11.7–15.5)
LYMPHS PCT: 20 %
Lymphs Abs: 1660 cells/uL (ref 850–3900)
MCH: 23.7 pg — AB (ref 27.0–33.0)
MCHC: 30.1 g/dL — AB (ref 32.0–36.0)
MCV: 78.7 fL — AB (ref 80.0–100.0)
MONO ABS: 415 {cells}/uL (ref 200–950)
MPV: 9.3 fL (ref 7.5–12.5)
Monocytes Relative: 5 %
NEUTROS PCT: 74 %
Neutro Abs: 6142 cells/uL (ref 1500–7800)
Platelets: 430 10*3/uL — ABNORMAL HIGH (ref 140–400)
RBC: 4.56 MIL/uL (ref 3.80–5.10)
RDW: 18 % — AB (ref 11.0–15.0)
WBC: 8.3 10*3/uL (ref 3.8–10.8)

## 2015-08-01 LAB — TSH: TSH: 2.4 mIU/L

## 2015-08-01 LAB — VITAMIN D 25 HYDROXY (VIT D DEFICIENCY, FRACTURES): Vit D, 25-Hydroxy: 30 ng/mL (ref 30–100)

## 2015-08-03 NOTE — Progress Notes (Signed)
Quick Note:     Dear Ms. Hugg,    It was wonderful to see you in our office recently!   I reviewed your lab work and mostly everything looks within acceptable ranges. However, there are some abnormalities with your cholesterol, elevated LDL, that we can discuss further at your next office visit. UNtil then, please engage in a routine exercise regimen of moderate-intensity aerobic activity for 30 minutes, 5 or more days per week and please watch your Saturated and Transfat intake.   Take care of yourself.   My best,   Dr Raliegh Scarlet  ______

## 2015-08-05 ENCOUNTER — Encounter: Payer: Self-pay | Admitting: Family Medicine

## 2015-08-05 DIAGNOSIS — M1990 Unspecified osteoarthritis, unspecified site: Secondary | ICD-10-CM | POA: Insufficient documentation

## 2015-08-05 NOTE — Assessment & Plan Note (Signed)
Greater than 5 minutes of [place] counseling performed

## 2015-08-05 NOTE — Assessment & Plan Note (Signed)
Counseling done,  we'll obtain blood work in near future.

## 2015-08-05 NOTE — Assessment & Plan Note (Signed)
Health counseling done: Proper BMI counseling done

## 2015-08-05 NOTE — Assessment & Plan Note (Signed)
She understands were giving her the Wellbutrin for smoking cessation and also is used in mood disorders.  Rest benefits of medicines discussed and handouts provided.

## 2015-08-05 NOTE — Assessment & Plan Note (Addendum)
Patient will check blood pressure at home on her own over the next 2 weeks and return to clinic with a log of what it has been running.  Extensive counseling done; educational handouts provided. Blood work obtained

## 2015-08-05 NOTE — Assessment & Plan Note (Addendum)
Obtain blood work of current levels

## 2015-08-06 ENCOUNTER — Telehealth: Payer: Self-pay

## 2015-08-06 MED ORDER — LEVOTHYROXINE SODIUM 125 MCG PO TABS: 125.0000 ug | ORAL_TABLET | Freq: Every day | ORAL | Status: DC

## 2015-08-06 NOTE — Telephone Encounter (Signed)
Pt request refills of levothyroxine.  RX sent to pharmacy.  Charyl Bigger, CMA

## 2015-08-28 ENCOUNTER — Ambulatory Visit: Payer: Managed Care, Other (non HMO) | Admitting: Family Medicine

## 2015-08-30 ENCOUNTER — Encounter: Payer: Self-pay | Admitting: Family Medicine

## 2015-08-30 ENCOUNTER — Ambulatory Visit (INDEPENDENT_AMBULATORY_CARE_PROVIDER_SITE_OTHER): Payer: Managed Care, Other (non HMO) | Admitting: Family Medicine

## 2015-08-30 VITALS — BP 130/80 | HR 98 | Wt 203.0 lb

## 2015-08-30 DIAGNOSIS — E785 Hyperlipidemia, unspecified: Secondary | ICD-10-CM | POA: Diagnosis not present

## 2015-08-30 DIAGNOSIS — K5909 Other constipation: Secondary | ICD-10-CM | POA: Insufficient documentation

## 2015-08-30 DIAGNOSIS — K5900 Constipation, unspecified: Secondary | ICD-10-CM

## 2015-08-30 DIAGNOSIS — D509 Iron deficiency anemia, unspecified: Secondary | ICD-10-CM

## 2015-08-30 DIAGNOSIS — Z716 Tobacco abuse counseling: Secondary | ICD-10-CM

## 2015-08-30 DIAGNOSIS — Z720 Tobacco use: Secondary | ICD-10-CM | POA: Diagnosis not present

## 2015-08-30 DIAGNOSIS — I10 Essential (primary) hypertension: Secondary | ICD-10-CM | POA: Diagnosis not present

## 2015-08-30 DIAGNOSIS — E669 Obesity, unspecified: Secondary | ICD-10-CM

## 2015-08-30 DIAGNOSIS — E559 Vitamin D deficiency, unspecified: Secondary | ICD-10-CM | POA: Diagnosis not present

## 2015-08-30 MED ORDER — CALCIUM CARBONATE-VITAMIN D 600-400 MG-UNIT PO TABS: 2.0000 | ORAL_TABLET | Freq: Every day | ORAL | 11 refills | Status: DC

## 2015-08-30 MED ORDER — FERROUS SULFATE 325 (65 FE) MG PO TABS: 325.0000 mg | ORAL_TABLET | Freq: Two times a day (BID) | ORAL | Status: DC

## 2015-08-30 MED ORDER — BUPROPION HCL ER (XL) 150 MG PO TB24: ORAL_TABLET | ORAL | 0 refills | Status: DC

## 2015-08-30 MED ORDER — POLYETHYLENE GLYCOL 3350 17 G PO PACK: 17.0000 g | PACK | Freq: Two times a day (BID) | ORAL | 11 refills | Status: DC

## 2015-08-30 MED ORDER — LOSARTAN POTASSIUM 100 MG PO TABS: 50.0000 mg | ORAL_TABLET | Freq: Every day | ORAL | 0 refills | Status: DC

## 2015-08-30 MED ORDER — ATORVASTATIN CALCIUM 20 MG PO TABS: 20.0000 mg | ORAL_TABLET | Freq: Every day | ORAL | 3 refills | Status: DC

## 2015-08-30 NOTE — Patient Instructions (Addendum)
Please look at the med list to see all the changes we made today. This includes change to your blood pressure medicine- added one to the hydrochlorothiazide, increase in Wellbutrin, added a cholesterol medicine, added MiraLAX, added slow release iron, and added a calcium vitamin D supplement pill.  You can use nicotine gum or patches even with the Wellbutrin    Smoking Cessation, Tips for Success If you are ready to quit smoking, congratulations! You have chosen to help yourself be healthier. Cigarettes bring nicotine, tar, carbon monoxide, and other irritants into your body. Your lungs, heart, and blood vessels will be able to work better without these poisons. There are many different ways to quit smoking. Nicotine gum, nicotine patches, a nicotine inhaler, or nicotine nasal spray can help with physical craving. Hypnosis, support groups, and medicines help break the habit of smoking. WHAT THINGS CAN I DO TO MAKE QUITTING EASIER?  Here are some tips to help you quit for good:  Pick a date when you will quit smoking completely. Tell all of your friends and family about your plan to quit on that date.  Do not try to slowly cut down on the number of cigarettes you are smoking. Pick a quit date and quit smoking completely starting on that day.  Throw away all cigarettes.   Clean and remove all ashtrays from your home, work, and car.  On a card, write down your reasons for quitting. Carry the card with you and read it when you get the urge to smoke.  Cleanse your body of nicotine. Drink enough water and fluids to keep your urine clear or pale yellow. Do this after quitting to flush the nicotine from your body.  Learn to predict your moods. Do not let a bad situation be your excuse to have a cigarette. Some situations in your life might tempt you into wanting a cigarette.  Never have "just one" cigarette. It leads to wanting another and another. Remind yourself of your decision to  quit.  Change habits associated with smoking. If you smoked while driving or when feeling stressed, try other activities to replace smoking. Stand up when drinking your coffee. Brush your teeth after eating. Sit in a different chair when you read the paper. Avoid alcohol while trying to quit, and try to drink fewer caffeinated beverages. Alcohol and caffeine may urge you to smoke.  Avoid foods and drinks that can trigger a desire to smoke, such as sugary or spicy foods and alcohol.  Ask people who smoke not to smoke around you.  Have something planned to do right after eating or having a cup of coffee. For example, plan to take a walk or exercise.  Try a relaxation exercise to calm you down and decrease your stress. Remember, you may be tense and nervous for the first 2 weeks after you quit, but this will pass.  Find new activities to keep your hands busy. Play with a pen, coin, or rubber band. Doodle or draw things on paper.  Brush your teeth right after eating. This will help cut down on the craving for the taste of tobacco after meals. You can also try mouthwash.   Use oral substitutes in place of cigarettes. Try using lemon drops, carrots, cinnamon sticks, or chewing gum. Keep them handy so they are available when you have the urge to smoke.  When you have the urge to smoke, try deep breathing.  Designate your home as a nonsmoking area.  If you are a  heavy smoker, ask your health care provider about a prescription for nicotine chewing gum. It can ease your withdrawal from nicotine.  Reward yourself. Set aside the cigarette money you save and buy yourself something nice.  Look for support from others. Join a support group or smoking cessation program. Ask someone at home or at work to help you with your plan to quit smoking.  Always ask yourself, "Do I need this cigarette or is this just a reflex?" Tell yourself, "Today, I choose not to smoke," or "I do not want to smoke." You are  reminding yourself of your decision to quit.  Do not replace cigarette smoking with electronic cigarettes (commonly called e-cigarettes). The safety of e-cigarettes is unknown, and some may contain harmful chemicals.  If you relapse, do not give up! Plan ahead and think about what you will do the next time you get the urge to smoke. HOW WILL I FEEL WHEN I QUIT SMOKING? You may have symptoms of withdrawal because your body is used to nicotine (the addictive substance in cigarettes). You may crave cigarettes, be irritable, feel very hungry, cough often, get headaches, or have difficulty concentrating. The withdrawal symptoms are only temporary. They are strongest when you first quit but will go away within 10-14 days. When withdrawal symptoms occur, stay in control. Think about your reasons for quitting. Remind yourself that these are signs that your body is healing and getting used to being without cigarettes. Remember that withdrawal symptoms are easier to treat than the major diseases that smoking can cause.  Even after the withdrawal is over, expect periodic urges to smoke. However, these cravings are generally short lived and will go away whether you smoke or not. Do not smoke! WHAT RESOURCES ARE AVAILABLE TO HELP ME QUIT SMOKING? Your health care provider can direct you to community resources or hospitals for support, which may include:  Group support.  Education.  Hypnosis.  Therapy.   This information is not intended to replace advice given to you by your health care provider. Make sure you discuss any questions you have with your health care provider.   Document Released: 10/05/2003 Document Revised: 01/27/2014 Document Reviewed: 06/24/2012 Elsevier Interactive Patient Education [address]. Tobacco Use Disorder Tobacco use disorder (TUD) is a mental disorder. It is the long-term use of tobacco in spite of related health problems or difficulty with normal life activities.  Tobacco is most commonly smoked as cigarettes and less commonly as cigars or pipes. Smokeless chewing tobacco and snuff are also popular. People with TUD get a feeling of extreme pleasure (euphoria) from using tobacco and have a desire to use it again and again. Repeated use of tobacco can cause problems. The addictive effects of tobacco are due mainly tothe ingredient nicotine. Nicotine also causes a rush of adrenaline (epinephrine) in the body. This leads to increased blood pressure, heart rate, and breathing rate. These changes may cause problems for people with high blood pressure, weak hearts, or lung disease. High doses of nicotine in children and pets can lead to seizures and death.  Tobacco contains a number of other unsafe chemicals. These chemicals are especially harmful when inhaled as smoke and can damage almost every organ in the body. Smokers live shorter lives than nonsmokers and are at risk of dying from a number of diseases and cancers. Tobacco smoke can also cause health problems for nonsmokers (due to inhaling secondhand smoke). Smoking is also a fire hazard.  TUD usually starts in the late  teenage years and is most common in young adults between the ages of 72 and 62 years. People who start smoking earlier in life are more likely to continue smoking as adults. TUD is somewhat more common in men than women. People with TUD are at higher risk for using alcohol and other drugs of abuse. RISK FACTORS Risk factors for TUD include:   Having family members with the disorder.  Being around people who use tobacco.  Having an existing mental health issue such as schizophrenia, depression, bipolar disorder, ADHD, or posttraumatic stress disorder (PTSD). SIGNS AND SYMPTOMS  People with tobacco use disorder have two or more of the following signs and symptoms within 12 months:   Use of more tobacco over a longer period than intended.   Not able to cut down or control tobacco use.   A lot  of time spent obtaining or using tobacco.   Strong desire or urge to use tobacco (craving). Cravings may last for 6 months or longer after quitting.  Use of tobacco even when use leads to major problems at work, school, or home.   Use of tobacco even when use leads to relationship problems.   Giving up or cutting down on important life activities because of tobacco use.   Repeatedly using tobacco in situations where it puts you or others in physical danger, like smoking in bed.   Use of tobacco even when it is known that a physical or mental problem is likely related to tobacco use.   Physical problems are numerous and may include chronic bronchitis, emphysema, lung and other cancers, gum disease, high blood pressure, heart disease, and stroke.   Mental problems caused by tobacco may include difficulty sleeping and anxiety.  Need to use greater amounts of tobacco to get the same effect. This means you have developed a tolerance.   Withdrawal symptoms as a result of stopping or rapidly cutting back use. These symptoms may last a month or more after quitting and include the following:   Depressed, anxious, or irritable mood.   Difficulty concentrating.   Increased appetite.  Restlessness or trouble sleeping.   Use of tobacco to avoid withdrawal symptoms. DIAGNOSIS  Tobacco use disorder is diagnosed by your health care provider. A diagnosis may be made by:  Your health care provider asking questions about your tobacco use and any problems it may be causing.  A physical exam.  Lab tests.  You may be referred to a mental health professional or addiction specialist. The severity of tobacco use disorder depends on the number of signs and symptoms you have:   Mild--Two or three symptoms.  Moderate--Four or five symptoms.   Severe--Six or more symptoms.  TREATMENT  Many people with tobacco use disorder are unable to quit on their own and need help. Treatment  options include the following:  Nicotine replacement therapy (NRT). NRT provides nicotine without the other harmful chemicals in tobacco. NRT gradually lowers the dosage of nicotine in the body and reduces withdrawal symptoms. NRT is available in over-the-counter forms (gum, lozenges, and skin patches) as well as prescription forms (mouth inhaler and nasal spray).  Medicines.This may include:  Antidepressant medicine that may reduce nicotine cravings.  A medicine that acts on nicotine receptors in the brain to reduce cravings and withdrawal symptoms. It may also block the effects of tobacco in people with TUD who relapse.  Counseling or talk therapy. A form of talk therapy called behavioral therapy is commonly used to treat people  with TUD. Behavioral therapy looks at triggers for tobacco use, how to avoid them, and how to cope with cravings. It is most effective in person or by phone but is also available in self-help forms (books and Internet websites).  Support groups. These provide emotional support, advice, and guidance for quitting tobacco. The most effective treatment for TUD is usually a combination of medicine, talk therapy, and support groups. HOME CARE INSTRUCTIONS  Keep all follow-up visits as directed by your health care provider. This is important.  Take medicines only as directed by your health care provider.  Check with your health care provider before starting new prescription or over-the-counter medicines. SEEK MEDICAL CARE IF:  You are not able to take your medicines as prescribed.  Treatment is not helping your TUD and your symptoms get worse. SEEK IMMEDIATE MEDICAL CARE IF:  You have serious thoughts about hurting yourself or others.  You have trouble breathing, chest pain, sudden weakness, or sudden numbness in part of your body.   This information is not intended to replace advice given to you by your health care provider. Make sure you discuss any questions  you have with your health care provider.   Document Released: 09/12/2003 Document Revised: 01/27/2014 Document Reviewed: 03/04/2013 Elsevier Interactive Patient Education [employer].

## 2015-08-30 NOTE — Progress Notes (Signed)
Impression and Recommendations:    1. Essential hypertension   2. HLD (hyperlipidemia)   3. Iron (Fe) deficiency anemia   4. Vitamin D insufficiency   5. Obesity   6. Tobacco abuse   7. Tobacco abuse counseling   8. Chronic constipation      Add losartan to patient's blood pressure regimen. Continue in addition to Hydrocort thiazide. Low salt diet, weight loss discussed with patient.  Patient's 10 year cardiovascular risk was over 6%. Risks and benefits of medications discussed with patient. We will start relatively low-dose Lipitor at this time.. Encouraged low saturated and Transfats diet and increase exercise.  Vitamin D deficiency discussed with patient. I advised her to increase intake at least 1200-1600 vitamin D and 1200 of calcium daily. We will recheck this in 6 months  Patient admits to history of iron deficiency anemia in past. She will take iron supplements. At least 325 twice daily. Risks and benefits of this medicine discussed with patient including constipation.  She has long-standing history of constipation and only goes to the bathroom twice weekly. I advised her to take MiraLAX twice daily on a regular basis and increase her water intake. Also advised to exercise which would help with constipation.  Advise weight loss. Handouts provided.  The patient's tobacco abuse, we will increase her Wellbutrin from 150-300. Advise also she can use over-the-counter nicotine patches and/or comes for help. Handouts provided. Counseling performed of at least 5 minutes.   Pt was in the office today for 40+ minutes, with over 50% time spent in face to face counseling of various medical concerns and in coordination of care  Patient's Medications  New Prescriptions   ATORVASTATIN (LIPITOR) 20 MG TABLET    Take 1 tablet (20 mg total) by mouth daily.   CALCIUM CARBONATE-VITAMIN D 600-400 MG-UNIT TABLET    Take 2 tablets by mouth daily.   LOSARTAN (COZAAR) 100 MG TABLET    Take  0.5 tablets (50 mg total) by mouth daily.   POLYETHYLENE GLYCOL (MIRALAX / GLYCOLAX) PACKET    Take 17 g by mouth 2 (two) times daily. Until stooling regularly  Previous Medications   ALPRAZOLAM (XANAX) 0.5 MG TABLET    Take 0.5 mg by mouth at bedtime as needed for anxiety.   LEVOTHYROXINE (SYNTHROID, LEVOTHROID) 125 MCG TABLET    Take 1 tablet (125 mcg total) by mouth daily before breakfast.  Modified Medications   Modified Medication Previous Medication   BUPROPION (WELLBUTRIN XL) 150 MG 24 HR TABLET buPROPion (WELLBUTRIN XL) 150 MG 24 hr tablet      Inc to two tabs daily    Take 1 tablet (150 mg total) by mouth every morning.  Discontinued Medications   HYDROCHLOROTHIAZIDE (HYDRODIURIL) 25 MG TABLET    Take 1 tablet (25 mg total) by mouth daily.    Return in about 8 weeks (around 10/25/2015) for We started cholesterol med, LFTs will need to be rechecked, recheck of blood pressure and increase W.  The patient was counseled, risk factors were discussed, anticipatory guidance given.  Gross side effects, risk and benefits, and alternatives of medications discussed with patient.  Patient is aware that all medications have potential side effects and we are unable to predict every side effect or drug-drug interaction that may occur.  Expresses verbal understanding and consents to current therapy plan and treatment regimen.  Please see AVS handed out to patient at the end of our visit for further patient instructions/ counseling done  pertaining to today's office visit.    Note: This document was prepared using Dragon voice recognition software and may include unintentional dictation errors.   --------------------------------------------------------------------------------------------------------------------------------------------------------------------------------------------------------------------------------------------    Subjective:    CC:  Chief Complaint  Patient presents with  .  Nicotine Dependence    Sophie reports the Wellbutrin has not helped decrease her urge to smoke.  Marland Kitchen Hypertension    She did start the hydroclorothiazide. She did check her blood pressure at work and it was within normal limits. Denies chest pain, shortness of breath or dizziness. She has noticed a decrease in headaches.    HPI: Mildrid Myrick is a 50 y.o. female who presents to [place] [facility] today for issues as discussed below.   Here to review blood work also recheck blood pressure and discuss effects of Wellbutrin which she started last time for nicotine dependence.    --> Bp at work has been running anywhere from 170's/ 100's and mostly runs around [id number] systolic.Marland Kitchen Since adding on the hydrochlorothiazide last office visit  (tolerating new med well ) she says her incidence of headache has gone down tremendously and she is happy with this. She denies any chest pain, shortness of breath, difficulty breathing, swelling in her ankles.  smoking cessation: Wellbutrin- doesn't think it's strong enough.  Tolerating well.  Helpinga little, but needs stronger dose.   All labs are reviewed with patient. She had several abnormalities and several questions about these today.   Wt Readings from Last 3 Encounters:  08/30/15 203 lb (92.1 kg)  07/31/15 205 lb 4.8 oz (93.1 kg)   BP Readings from Last 3 Encounters:  08/30/15 130/80  07/31/15 (!) 162/95   Pulse Readings from Last 3 Encounters:  08/30/15 98  07/31/15 86     Patient Active Problem List   Diagnosis Date Noted  . Obesity 07/31/2015    Priority: High  . Tobacco abuse 07/31/2015    Priority: High  . Essential hypertension 07/31/2015    Priority: High  . HLD (hyperlipidemia) 07/31/2015    Priority: High  . Adjustment disorder with mixed anxiety and depressed mood 07/31/2015    Priority: Medium  . Iron (Fe) deficiency anemia 08/30/2015  . Vitamin D insufficiency 08/30/2015  . Chronic constipation  08/30/2015  . OA multiple jts 08/05/2015  . Hypothyroidism 07/31/2015  . Tobacco abuse counseling 07/31/2015  . SK (solar keratosis) 07/31/2015  . Multiple atypical nevi 07/31/2015    Past Medical history, Surgical history, Family history, Social history, Allergies and Medications have been entered into the medical record, reviewed and changed as needed.   Allergies:  Allergies  Allergen Reactions  . Ranitidine     Review of Systems: No fever/ chills, night sweats, no unintended weight loss, No chest pain, or increased shortness of breath. No N/V/D.  Pertinent positives and negatives noted in HPI above    Objective:   Blood pressure 130/80, pulse 98, weight 203 lb (92.1 kg), SpO2 97 %. Body mass index is 35.12 kg/m.  General: Well Developed, well nourished, appropriate for stated age.  Neuro: Alert and oriented x3, extra-ocular muscles intact, sensation grossly intact.  HEENT: Normocephalic, atraumatic, neck supple   Skin: Warm and dry, no gross rash. Cardiac: RRR, S1 S2,  no murmurs rubs or gallops.  Respiratory: ECTA B/L, Not using accessory muscles, speaking in full sentences-unlabored. Vascular:  No gross lower ext edema, cap RF less 2 sec. Psych: No SI/HI, Insight and judgement good

## 2015-08-30 NOTE — Assessment & Plan Note (Signed)
Discussed with patient her 10 year risk is at 6% with smoking if she quit smoking, her risk will go down to 2%. I recommend she go on medicines, statins at this time. We can always consider coming off them in the future if she quits.

## 2015-10-10 ENCOUNTER — Other Ambulatory Visit: Payer: Self-pay | Admitting: Family Medicine

## 2015-10-29 ENCOUNTER — Ambulatory Visit (INDEPENDENT_AMBULATORY_CARE_PROVIDER_SITE_OTHER): Payer: Managed Care, Other (non HMO) | Admitting: Family Medicine

## 2015-10-29 ENCOUNTER — Encounter: Payer: Self-pay | Admitting: Family Medicine

## 2015-10-29 VITALS — BP 138/89 | HR 89 | Ht 63.75 in | Wt 204.9 lb

## 2015-10-29 DIAGNOSIS — E782 Mixed hyperlipidemia: Secondary | ICD-10-CM

## 2015-10-29 DIAGNOSIS — Z720 Tobacco use: Secondary | ICD-10-CM

## 2015-10-29 DIAGNOSIS — Z716 Tobacco abuse counseling: Secondary | ICD-10-CM

## 2015-10-29 DIAGNOSIS — Z6835 Body mass index (BMI) 35.0-35.9, adult: Secondary | ICD-10-CM

## 2015-10-29 DIAGNOSIS — F4323 Adjustment disorder with mixed anxiety and depressed mood: Secondary | ICD-10-CM | POA: Diagnosis not present

## 2015-10-29 DIAGNOSIS — I10 Essential (primary) hypertension: Secondary | ICD-10-CM | POA: Diagnosis not present

## 2015-10-29 MED ORDER — ATORVASTATIN CALCIUM 20 MG PO TABS: 20.0000 mg | ORAL_TABLET | Freq: Every day | ORAL | 3 refills | Status: DC

## 2015-10-29 MED ORDER — LOSARTAN POTASSIUM 100 MG PO TABS: 50.0000 mg | ORAL_TABLET | Freq: Every day | ORAL | 1 refills | Status: DC

## 2015-10-29 MED ORDER — ALPRAZOLAM 0.5 MG PO TABS: 0.5000 mg | ORAL_TABLET | ORAL | 0 refills | Status: AC | PRN

## 2015-10-29 MED ORDER — ESCITALOPRAM OXALATE 20 MG PO TABS: ORAL_TABLET | ORAL | 1 refills | Status: DC

## 2015-10-29 MED ORDER — BUPROPION HCL ER (XL) 150 MG PO TB24: ORAL_TABLET | ORAL | 1 refills | Status: DC

## 2015-10-29 NOTE — Patient Instructions (Addendum)
Go on YouTube use things such as guided meditation for detachment from overthinking by Edman Circle as well as guided meditation for sleep, floating amongst the stars by Corene Cornea Stevenson---->  please use your phones at first and then later on as you get used to it you may not need it.   Adjustment Disorder Adjustment disorder is an unusually severe reaction to a stressful life event, such as the loss of a job or physical illness. The event may be any stressful event other than the loss of a loved one. Adjustment disorder may affect your feelings, your thinking, how you act, or a combination of these. It may interfere with personal relationships or with the way you are at work, school, or home. People with this disorder are at risk for suicide and substance abuse. They may develop a more serious mental disorder, such as major depressive disorder or post-traumatic stress disorder. SIGNS AND SYMPTOMS  Symptoms may include:  Sadness, depressed mood, or crying spells.  Loss of enjoyment.  Change in appetite or weight.  Sense of loss or hopelessness.  Thoughts of suicide.  Anxiety, worry, or nervousness.  Trouble sleeping.  Avoiding family and friends.  Poor school performance.  Fighting or vandalism.  Reckless driving.  Skipping school.  Poor work Systems analyst.  Ignoring bills. Symptoms of adjustment disorder start within 3 months of the stressful life event. They do not last more than 6 months after the event has ended. DIAGNOSIS  To make a diagnosis, your health care provider will ask about what has happened in your life and how it has affected you. He or she may also ask about your medical history and use of medicines, alcohol, and other substances. Your health care provider may do a physical exam and order lab tests or other studies. You may be referred to a mental health specialist for evaluation. TREATMENT  Treatment options include:  Counseling or talk therapy. Talk therapy  is usually provided by mental health specialists.  Medicine. Certain medicines may help with depression, anxiety, and sleep.  Support groups. Support groups offer emotional support, advice, and guidance. They are made up of people who have had similar experiences. HOME CARE INSTRUCTIONS  Keep all follow-up visits as directed by your health care provider. This is important.  Take medicines only as directed by your health care provider. SEEK MEDICAL CARE IF:  Your symptoms get worse.  SEEK IMMEDIATE MEDICAL CARE IF: You have serious thoughts about hurting yourself or someone else. MAKE SURE YOU:  Understand these instructions.  Will watch your condition.  Will get help right away if you are not doing well or get worse.   This information is not intended to replace advice given to you by your health care provider. Make sure you discuss any questions you have with your health care provider.   Document Released: 09/10/2005 Document Revised: 01/27/2014 Document Reviewed: 05/31/2013 Elsevier Interactive Patient Education [employer].

## 2015-10-29 NOTE — Progress Notes (Signed)
Impression and Recommendations:    1. Essential hypertension   2. Mixed hyperlipidemia   3. Tobacco abuse   4. Adjustment disorder with mixed anxiety and depressed mood   5. Tobacco abuse counseling   6. BMI 35.0-35.9,adult     HTN: cont current regimen.  Cont to monitor at work  Cholesterol:  Continue the Lipitor. Recheck fasting lipid profile in 2-3 months; diet and exercise d/c pt. .  Tobacco:  Cont to ween off; consider vapor use with low dose nicotine  Mood:  Dec Wellbutrin back down to 150 mg XL daily and then ADD LEXAPRO since worked well in past- for anxiety and depression.  Wt:  Use my fitness pal for keeping track of everything she puts in her mouth. Needs to make sure she measures her food and knows exactly what 4 ounces of stay car or a half a cup of mashed potatoes etc.   Education and routine counseling performed. Handouts provided.   New Prescriptions   ESCITALOPRAM (LEXAPRO) 20 MG TABLET    One half tab daily for a week then one tab by mouth daily    Modified Medications   Modified Medication Previous Medication   ALPRAZOLAM (XANAX) 0.5 MG TABLET ALPRAZolam (XANAX) 0.5 MG tablet      Take 1 tablet (0.5 mg total) by mouth as needed for anxiety.    Take 0.5 mg by mouth at bedtime as needed for anxiety.   ATORVASTATIN (LIPITOR) 20 MG TABLET atorvastatin (LIPITOR) 20 MG tablet      Take 1 tablet (20 mg total) by mouth daily. Every night    Take 1 tablet (20 mg total) by mouth daily.   BUPROPION (WELLBUTRIN XL) 150 MG 24 HR TABLET buPROPion (WELLBUTRIN XL) 150 MG 24 hr tablet      One by mouth daily    Inc to two tabs daily   LOSARTAN (COZAAR) 100 MG TABLET losartan (COZAAR) 100 MG tablet      Take 0.5 tablets (50 mg total) by mouth daily.    Take 0.5 tablets (50 mg total) by mouth daily.    Discontinued Medications   BUPROPION (WELLBUTRIN XL) 150 MG 24 HR TABLET    TAKE 1 TABLET (150 MG TOTAL) BY MOUTH EVERY MORNING.   CALCIUM CARBONATE-VITAMIN D 600-400  MG-UNIT TABLET    Take 2 tablets by mouth daily.     Return in about 6 weeks (around 12/10/2015) for Follow-up on adding Lexapro, decreased Wellbutrin, see sleep with using meditation.  The patient was counseled, risk factors were discussed, anticipatory guidance given.  Gross side effects, risk and benefits, and alternatives of medications discussed with patient.  Patient is aware that all medications have potential side effects and we are unable to predict every side effect or drug-drug interaction that may occur.  Expresses verbal understanding and consents to current therapy plan and treatment regimen.  Please see AVS handed out to patient at the end of our visit for further patient instructions/ counseling done pertaining to today's office visit.    Note: This document was prepared using Dragon voice recognition software and may include unintentional dictation errors.     Subjective:    Chief Complaint  Patient presents with  . Hypertension  . Hyperlipidemia    HPI: Kelly [name] is a 50 y.o. female who presents to [facility] today for follow up for HTN and HLD and mood/smoking cess.  --> last OV we added losartan and  lipitor to pt's regimen.    ---> also we increased Wellbutrin in hopes to aid in smoking cess.     HTN: Home BP readings have been running around what is today.   Under 140/90 regular basis.   Pt has been tolerating meds well.  Taking as prescribed.  Denies HA, dizziness, CP, SOB, Visual changes, increasing pedal edema.   Preventitive Healthcare:  Exercise: no - not yet.    Diet Pattern:  Stress eating  Salt Restriction: yes.   Hyperlipidemia: Last OV we started Lipitor- see latest levels below.  36yr risk was over 6%.  She is tolerating it well.  Taking as prescribed.   There is a family history of hyperlipidemia. There is a family hx CAD-  Before age 20- MGM   Smoking Cessation: Patient tolerating increase in Wellbutrin  well- cut back some-- somedays down to 6 cig/ day. - which is big improvement.    Depression:     Patient complains of anxiety- stress at work.  On call 24-7.  Daughter thru difficult time. Tearful moments- most days of the week.  She does not feel that the Wellbutrin increased from 150-300 is helping that much. She is using Lexapro in the past with good relief of her symptoms. -  She complains of depressed mood, difficulty concentrating, fatigue, hopelessness, impaired memory, insomnia and psychomotor agitation. Onset was yrs ago.  Overall better---> started with children related stress.  She denies current suicidal and homicidal plan or intent.    Post-menop 10- yrs ago.    Wt Readings from Last 3 Encounters:  10/29/15 204 lb 14.4 oz (92.9 kg)  08/30/15 203 lb (92.1 kg)  07/31/15 205 lb 4.8 oz (93.1 kg)    BP Readings from Last 3 Encounters:  10/29/15 138/89  08/30/15 130/80  07/31/15 (!) 162/95    Pulse Readings from Last 3 Encounters:  10/29/15 89  08/30/15 98  07/31/15 86    BMI Readings from Last 3 Encounters:  10/29/15 35.45 kg/m  08/30/15 35.12 kg/m  07/31/15 35.52 kg/m     Lab Results  Component Value Date   CREATININE 0.76 07/31/2015   BUN 10 07/31/2015   NA 143 07/31/2015   K 4.7 07/31/2015   CL 102 07/31/2015   CO2 27 07/31/2015    Lab Results  Component Value Date   CHOL 250 (H) 07/31/2015    Lab Results  Component Value Date   HDL 59 07/31/2015    Lab Results  Component Value Date   LDLCALC 166 (H) 07/31/2015    Lab Results  Component Value Date   TRIG 126 07/31/2015    Lab Results  Component Value Date   CHOLHDL 4.2 07/31/2015    No results found for: LDLDIRECT ===================================================================  Patient Active Problem List   Diagnosis Date Noted  . BMI 35.0-35.9,adult 07/31/2015    Priority: High  . Tobacco abuse 07/31/2015    Priority: High  . Essential hypertension 07/31/2015     Priority: High  . HLD (hyperlipidemia) 07/31/2015    Priority: High  . Adjustment disorder with mixed anxiety and depressed mood 07/31/2015    Priority: Medium  . Iron (Fe) deficiency anemia 08/30/2015  . Vitamin D insufficiency 08/30/2015  . Chronic constipation 08/30/2015  . OA multiple jts 08/05/2015  . Hypothyroidism 07/31/2015  . Tobacco abuse counseling 07/31/2015  . SK (solar keratosis) 07/31/2015  . Multiple atypical nevi 07/31/2015    Past Medical History:  Diagnosis Date  . Arthritis   .  Depression   . Essential hypertension 07/31/2015  . Hypothyroidism 07/31/2015  . Thyroid disease     Past Surgical History:  Procedure Laterality Date  . BLADDER SUSPENSION    . TONSILLECTOMY    . TUBAL LIGATION      Family History  Problem Relation Age of Onset  . Healthy Mother   . Aneurysm Father   . Hypertension Father   . Hyperlipidemia Father   . Healthy Sister   . Healthy Daughter   . Heart disease Son     History  Drug Use No  ,  History  Alcohol Use No  ,  History  Smoking Status  . Current Every Day Smoker  Smokeless Tobacco  . Never Used  ,    Current Outpatient Prescriptions on File Prior to Visit  Medication Sig Dispense Refill  . levothyroxine (SYNTHROID, LEVOTHROID) 125 MCG tablet Take 1 tablet (125 mcg total) by mouth daily before breakfast. 90 tablet 1  . polyethylene glycol (MIRALAX / GLYCOLAX) packet Take 17 g by mouth 2 (two) times daily. Until stooling regularly 30 packet 11   Current Facility-Administered Medications on File Prior to Visit  Medication Dose Route Frequency Provider Last Rate Last Dose  . ferrous sulfate tablet 325 mg  325 mg Oral BID WC Mellody Dance, DO        Allergies  Allergen Reactions  . Ranitidine      Review of Systems  Constitutional: Negative.  Negative for chills, diaphoresis, fever, malaise/fatigue and weight loss.  HENT: Negative.  Negative for congestion, sore throat and tinnitus.   Eyes: Negative.   Negative for blurred vision, double vision and photophobia.  Respiratory: Negative.  Negative for cough and wheezing.   Cardiovascular: Negative.  Negative for chest pain and palpitations.  Gastrointestinal: Negative.  Negative for blood in stool, diarrhea, nausea and vomiting.  Genitourinary: Negative.  Negative for dysuria, frequency and urgency.  Musculoskeletal: Negative.  Negative for joint pain and myalgias.  Skin: Negative.  Negative for itching and rash.  Neurological: Negative.  Negative for dizziness, focal weakness, weakness and headaches.  Endo/Heme/Allergies: Negative.  Negative for environmental allergies and polydipsia. Does not bruise/bleed easily.  Psychiatric/Behavioral: Negative.  Negative for depression and memory loss. The patient is not nervous/anxious and does not have insomnia.    Review of Systems:  General:  Denies fever, chills, appetite changes, unexplained weight loss.  Respiratory: Denies SOB, DOE, cough, wheezing.  Cardiovascular: Denies chest pain, palpitations.  Gastrointestinal: Denies nausea, vomiting, diarrhea, abdominal pain.  Genitourinary: Denies dysuria, increased frequency, flank pain. Endocrine: Denies hot or cold intolerance, polyuria, polydipsia. Musculoskeletal: Denies myalgias, back pain, joint swelling, arthralgias, gait problems.  Skin: Denies pallor, rash, suspicious lesions.  Neurological: Denies dizziness, seizures, syncope, unexplained weakness, lightheadedness, numbness and headaches.  Psychiatric/Behavioral: Denies mood changes, suicidal or homicidal ideations, hallucinations, sleep disturbances.    Objective:    Blood pressure 138/89, pulse 89, height 5' 3.75" (1.619 m), weight 204 lb 14.4 oz (92.9 kg).  Body mass index is 35.45 kg/m.  General: Well Developed, well nourished, and in no acute distress.  HEENT: Normocephalic, atraumatic, pupils equal round reactive to light, neck supple, No carotid bruits, no JVD Skin: Warm and  dry, cap RF less 2 sec Cardiac: Regular rate and rhythm, S1, S2 WNL's, no murmurs rubs or gallops Respiratory: ECTA B/L, Not using accessory muscles, speaking in full sentences. NeuroM-Sk: Ambulates w/o assistance, moves ext * 4 w/o difficulty, sensation grossly intact.  Ext: no edema b/l lower  ext Psych: No HI/SI, judgement and insight good, Euthymic mood. Full Affect.

## 2015-12-05 ENCOUNTER — Other Ambulatory Visit: Payer: Self-pay | Admitting: Family Medicine

## 2015-12-05 ENCOUNTER — Ambulatory Visit: Payer: Managed Care, Other (non HMO) | Admitting: Family Medicine

## 2016-01-01 ENCOUNTER — Other Ambulatory Visit: Payer: Self-pay | Admitting: Family Medicine

## 2016-03-13 ENCOUNTER — Other Ambulatory Visit: Payer: Self-pay

## 2016-03-13 NOTE — Telephone Encounter (Signed)
Received RX refill request for alprazolam from pharmacy.  Rx denied.  Advised pt that she was to have returned 11/2015 for follow up of medication and that she must have OV before any further refills.  Pt stated that she will call back to schedule OV.  Charyl Bigger, CMA

## 2016-03-15 ENCOUNTER — Other Ambulatory Visit: Payer: Self-pay | Admitting: Family Medicine

## 2016-03-17 ENCOUNTER — Other Ambulatory Visit: Payer: Self-pay

## 2016-03-17 NOTE — Telephone Encounter (Signed)
Pt was informed on 03/13/16 that she must have OV before any further refills on medications.  30 day supply sent to pharmacy.  Charyl Bigger, CMA

## 2016-04-14 ENCOUNTER — Other Ambulatory Visit: Payer: Self-pay | Admitting: Family Medicine

## 2016-04-15 ENCOUNTER — Telehealth: Payer: Self-pay | Admitting: Family Medicine
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
## 2017-10-29 DIAGNOSIS — E7800 Pure hypercholesterolemia, unspecified: Secondary | ICD-10-CM | POA: Diagnosis not present

## 2017-10-29 DIAGNOSIS — E039 Hypothyroidism, unspecified: Secondary | ICD-10-CM | POA: Diagnosis not present

## 2017-10-29 DIAGNOSIS — Z23 Encounter for immunization: Secondary | ICD-10-CM | POA: Diagnosis not present

## 2017-10-29 DIAGNOSIS — R7301 Impaired fasting glucose: Secondary | ICD-10-CM | POA: Diagnosis not present

## 2017-10-29 DIAGNOSIS — I10 Essential (primary) hypertension: Secondary | ICD-10-CM | POA: Diagnosis not present

## 2018-05-17 DIAGNOSIS — R05 Cough: Secondary | ICD-10-CM | POA: Diagnosis not present

## 2018-05-17 DIAGNOSIS — M791 Myalgia, unspecified site: Secondary | ICD-10-CM | POA: Diagnosis not present

## 2018-05-17 DIAGNOSIS — R0602 Shortness of breath: Secondary | ICD-10-CM | POA: Diagnosis not present

## 2018-05-17 DIAGNOSIS — R439 Unspecified disturbances of smell and taste: Secondary | ICD-10-CM | POA: Diagnosis not present

## 2018-05-17 DIAGNOSIS — F17200 Nicotine dependence, unspecified, uncomplicated: Secondary | ICD-10-CM | POA: Diagnosis not present

## 2018-05-17 DIAGNOSIS — R51 Headache: Secondary | ICD-10-CM | POA: Diagnosis not present

## 2018-05-17 DIAGNOSIS — R197 Diarrhea, unspecified: Secondary | ICD-10-CM | POA: Diagnosis not present

## 2018-05-17 DIAGNOSIS — Z20828 Contact with and (suspected) exposure to other viral communicable diseases: Secondary | ICD-10-CM | POA: Diagnosis not present

## 2018-05-17 DIAGNOSIS — J029 Acute pharyngitis, unspecified: Secondary | ICD-10-CM | POA: Diagnosis not present

## 2018-05-17 DIAGNOSIS — R509 Fever, unspecified: Secondary | ICD-10-CM | POA: Diagnosis not present

## 2018-07-06 DIAGNOSIS — I10 Essential (primary) hypertension: Secondary | ICD-10-CM | POA: Diagnosis not present

## 2018-07-06 DIAGNOSIS — Z6836 Body mass index (BMI) 36.0-36.9, adult: Secondary | ICD-10-CM | POA: Diagnosis not present

## 2018-08-17 DIAGNOSIS — I10 Essential (primary) hypertension: Secondary | ICD-10-CM | POA: Diagnosis not present

## 2018-08-17 DIAGNOSIS — Z6837 Body mass index (BMI) 37.0-37.9, adult: Secondary | ICD-10-CM | POA: Diagnosis not present

## 2018-09-07 DIAGNOSIS — Z20828 Contact with and (suspected) exposure to other viral communicable diseases: Secondary | ICD-10-CM | POA: Diagnosis not present

## 2021-01-15 ENCOUNTER — Observation Stay (HOSPITAL_COMMUNITY): Payer: No Typology Code available for payment source

## 2021-01-15 ENCOUNTER — Observation Stay (HOSPITAL_COMMUNITY)
Admission: EM | Admit: 2021-01-15 | Discharge: 2021-01-17 | Disposition: A | Discharge status: Home / Self Care | Payer: No Typology Code available for payment source | Attending: Family Medicine | Admitting: Family Medicine

## 2021-01-15 ENCOUNTER — Emergency Department (HOSPITAL_COMMUNITY): Payer: No Typology Code available for payment source

## 2021-01-15 ENCOUNTER — Encounter (HOSPITAL_COMMUNITY): Payer: Self-pay

## 2021-01-15 ENCOUNTER — Other Ambulatory Visit: Payer: Self-pay

## 2021-01-15 ENCOUNTER — Observation Stay (HOSPITAL_BASED_OUTPATIENT_CLINIC_OR_DEPARTMENT_OTHER): Payer: No Typology Code available for payment source

## 2021-01-15 DIAGNOSIS — R7303 Prediabetes: Secondary | ICD-10-CM | POA: Insufficient documentation

## 2021-01-15 DIAGNOSIS — Z20822 Contact with and (suspected) exposure to covid-19: Secondary | ICD-10-CM | POA: Insufficient documentation

## 2021-01-15 DIAGNOSIS — S301XXA Contusion of abdominal wall, initial encounter: Secondary | ICD-10-CM | POA: Diagnosis not present

## 2021-01-15 DIAGNOSIS — F4323 Adjustment disorder with mixed anxiety and depressed mood: Secondary | ICD-10-CM | POA: Diagnosis not present

## 2021-01-15 DIAGNOSIS — F17210 Nicotine dependence, cigarettes, uncomplicated: Secondary | ICD-10-CM | POA: Diagnosis not present

## 2021-01-15 DIAGNOSIS — R55 Syncope and collapse: Secondary | ICD-10-CM

## 2021-01-15 DIAGNOSIS — S20212A Contusion of left front wall of thorax, initial encounter: Secondary | ICD-10-CM

## 2021-01-15 DIAGNOSIS — R079 Chest pain, unspecified: Secondary | ICD-10-CM | POA: Diagnosis present

## 2021-01-15 DIAGNOSIS — I10 Essential (primary) hypertension: Secondary | ICD-10-CM | POA: Insufficient documentation

## 2021-01-15 DIAGNOSIS — F4321 Adjustment disorder with depressed mood: Secondary | ICD-10-CM | POA: Diagnosis present

## 2021-01-15 DIAGNOSIS — Z79899 Other long term (current) drug therapy: Secondary | ICD-10-CM | POA: Diagnosis not present

## 2021-01-15 DIAGNOSIS — K449 Diaphragmatic hernia without obstruction or gangrene: Secondary | ICD-10-CM | POA: Diagnosis not present

## 2021-01-15 DIAGNOSIS — S2241XA Multiple fractures of ribs, right side, initial encounter for closed fracture: Secondary | ICD-10-CM | POA: Diagnosis not present

## 2021-01-15 DIAGNOSIS — E785 Hyperlipidemia, unspecified: Secondary | ICD-10-CM | POA: Diagnosis present

## 2021-01-15 DIAGNOSIS — S299XXA Unspecified injury of thorax, initial encounter: Secondary | ICD-10-CM | POA: Diagnosis present

## 2021-01-15 DIAGNOSIS — R911 Solitary pulmonary nodule: Secondary | ICD-10-CM | POA: Insufficient documentation

## 2021-01-15 DIAGNOSIS — I959 Hypotension, unspecified: Secondary | ICD-10-CM

## 2021-01-15 DIAGNOSIS — S3011XA Contusion of abdominal wall, initial encounter: Secondary | ICD-10-CM

## 2021-01-15 DIAGNOSIS — G9389 Other specified disorders of brain: Secondary | ICD-10-CM | POA: Diagnosis not present

## 2021-01-15 DIAGNOSIS — Z720 Tobacco use: Secondary | ICD-10-CM | POA: Diagnosis present

## 2021-01-15 DIAGNOSIS — S32009A Unspecified fracture of unspecified lumbar vertebra, initial encounter for closed fracture: Secondary | ICD-10-CM | POA: Diagnosis not present

## 2021-01-15 DIAGNOSIS — E039 Hypothyroidism, unspecified: Secondary | ICD-10-CM | POA: Insufficient documentation

## 2021-01-15 HISTORY — DX: Hyperlipidemia, unspecified: E78.5

## 2021-01-15 LAB — CBC WITH DIFFERENTIAL/PLATELET
Abs Immature Granulocytes: 0.09 10*3/uL — ABNORMAL HIGH (ref 0.00–0.07)
Basophils Absolute: 0 10*3/uL (ref 0.0–0.1)
Basophils Relative: 0 %
Eosinophils Absolute: 0.1 10*3/uL (ref 0.0–0.5)
Eosinophils Relative: 0 %
HCT: 47.2 % — ABNORMAL HIGH (ref 36.0–46.0)
Hemoglobin: 15.8 g/dL — ABNORMAL HIGH (ref 12.0–15.0)
Immature Granulocytes: 1 %
Lymphocytes Relative: 17 %
Lymphs Abs: 2.4 10*3/uL (ref 0.7–4.0)
MCH: 31.7 pg (ref 26.0–34.0)
MCHC: 33.5 g/dL (ref 30.0–36.0)
MCV: 94.6 fL (ref 80.0–100.0)
Monocytes Absolute: 0.5 10*3/uL (ref 0.1–1.0)
Monocytes Relative: 3 %
Neutro Abs: 11.4 10*3/uL — ABNORMAL HIGH (ref 1.7–7.7)
Neutrophils Relative %: 79 %
Platelets: 305 10*3/uL (ref 150–400)
RBC: 4.99 MIL/uL (ref 3.87–5.11)
RDW: 12.9 % (ref 11.5–15.5)
WBC: 14.4 10*3/uL — ABNORMAL HIGH (ref 4.0–10.5)
nRBC: 0 % (ref 0.0–0.2)

## 2021-01-15 LAB — COMPREHENSIVE METABOLIC PANEL
ALT: 23 U/L (ref 0–44)
AST: 26 U/L (ref 15–41)
Albumin: 3.6 g/dL (ref 3.5–5.0)
Alkaline Phosphatase: 82 U/L (ref 38–126)
Anion gap: 11 (ref 5–15)
BUN: 15 mg/dL (ref 6–20)
CO2: 22 mmol/L (ref 22–32)
Calcium: 9.1 mg/dL (ref 8.9–10.3)
Chloride: 104 mmol/L (ref 98–111)
Creatinine, Ser: 1.03 mg/dL — ABNORMAL HIGH (ref 0.44–1.00)
GFR, Estimated: >60 mL/min (ref >60)
Glucose, Bld: 148 mg/dL — ABNORMAL HIGH (ref 70–99)
Potassium: 3.9 mmol/L (ref 3.5–5.1)
Sodium: 137 mmol/L (ref 135–145)
Total Bilirubin: 0.4 mg/dL (ref 0.3–1.2)
Total Protein: 6.3 g/dL — ABNORMAL LOW (ref 6.5–8.1)

## 2021-01-15 LAB — ECHOCARDIOGRAM COMPLETE
Area-P 1/2: 2.95 cm2
Height: 64 in
S' Lateral: 2.4 cm
Weight: 4000 oz

## 2021-01-15 LAB — URINALYSIS, ROUTINE W REFLEX MICROSCOPIC
Bilirubin Urine: NEGATIVE
Glucose, UA: NEGATIVE mg/dL
Ketones, ur: NEGATIVE mg/dL
Leukocytes,Ua: NEGATIVE
Nitrite: NEGATIVE
Protein, ur: NEGATIVE mg/dL
Specific Gravity, Urine: 1.01 (ref 1.005–1.030)
pH: 7 (ref 5.0–8.0)

## 2021-01-15 LAB — URINALYSIS, MICROSCOPIC (REFLEX): Bacteria, UA: NONE SEEN

## 2021-01-15 LAB — CBG MONITORING, ED: Glucose-Capillary: 152 mg/dL — ABNORMAL HIGH (ref 70–99)

## 2021-01-15 LAB — RAPID URINE DRUG SCREEN, HOSP PERFORMED
Amphetamines: NOT DETECTED
Barbiturates: NOT DETECTED
Benzodiazepines: NOT DETECTED
Cocaine: NOT DETECTED
Opiates: NOT DETECTED
Tetrahydrocannabinol: NOT DETECTED

## 2021-01-15 LAB — RESP PANEL BY RT-PCR (FLU A&B, COVID) ARPGX2
Influenza A by PCR: NEGATIVE
Influenza B by PCR: NEGATIVE
SARS Coronavirus 2 by RT PCR: NEGATIVE

## 2021-01-15 LAB — TROPONIN I (HIGH SENSITIVITY)
Troponin I (High Sensitivity): 7 ng/L (ref <18)
Troponin I (High Sensitivity): 5 ng/L (ref <18)

## 2021-01-15 LAB — HIV ANTIBODY (ROUTINE TESTING W REFLEX): HIV Screen 4th Generation wRfx: NONREACTIVE

## 2021-01-15 LAB — TSH: TSH: 7.631 u[IU]/mL — ABNORMAL HIGH (ref 0.350–4.500)
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
## 2021-01-15 IMAGING — MR MR HEAD WO/W CM
14 of 16 series · 40 of 48 positions shown · IV contrast (gadavist)
Comparison: [DATE] head CT

CLINICAL DATA: Mass lesions seen on CT head.

EXAM:
MRI HEAD WITHOUT AND WITH CONTRAST
TECHNIQUE: Multiplanar, multiecho pulse sequences of the brain and surrounding
structures were obtained without and with intravenous contrast.
CONTRAST:  10mL GADAVIST GADOBUTROL 1 MMOL/ML IV SOLN

[Series 5: DWI · axial · 3.0mm · 0.88mm/px · z∈[-104,+53]mm · 6 of 108 slices shown (1 of 4)]
[im 1/108]
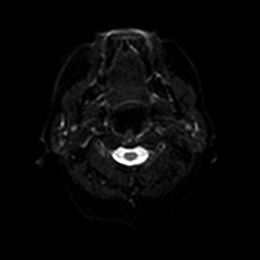
[im 22/108]
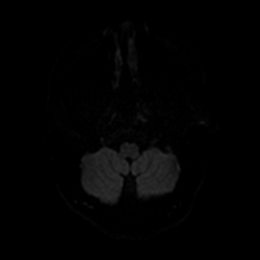
[im 43/108]
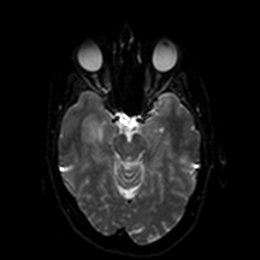
[im 65/108]
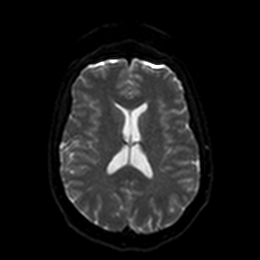
[im 86/108]
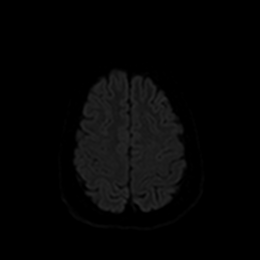
[im 108/108]
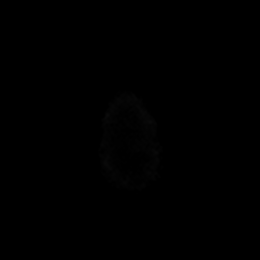

[Series 6: DWI · axial · 3.0mm · 0.88mm/px · z∈[-104,+53]mm · 2 of 53 slices shown (2 of 4)]
[im 1/53]
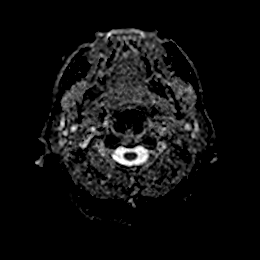
[im 53/53]
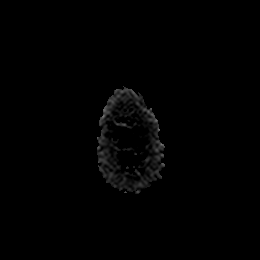

[Series 7: DWI · coronal · 4.0mm · 0.88mm/px · 3 of 68 slices shown (3 of 4)]
[im 1/68]
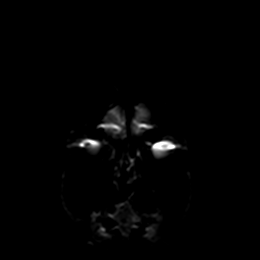
[im 34/68]
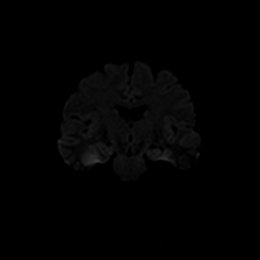
[im 68/68]
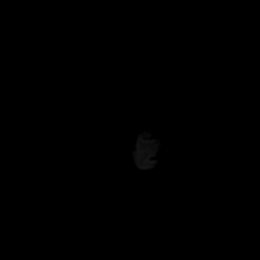

[Series 8: DWI · coronal · 4.0mm · 0.88mm/px · 2 of 34 slices shown (4 of 4)]
[im 1/34]
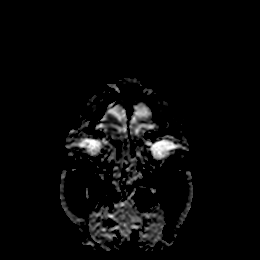
[im 34/34]
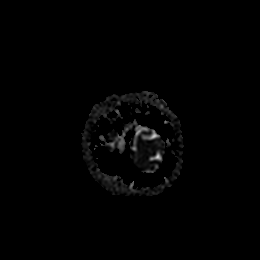

[Series 9: T1 · sagittal · 5.0mm · 0.75mm/px · 2 of 25 slices shown]
[im 1/25]
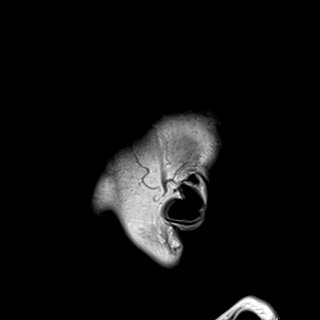
[im 25/25]
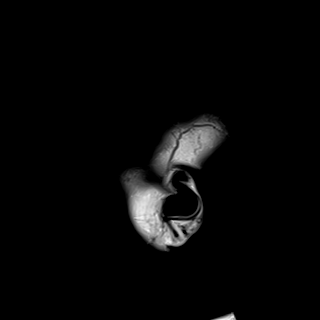

[Series 10: T2 · axial · 5.0mm · 0.72mm/px · z∈[-103,+52]mm · 2 of 27 slices shown]
[im 1/27]
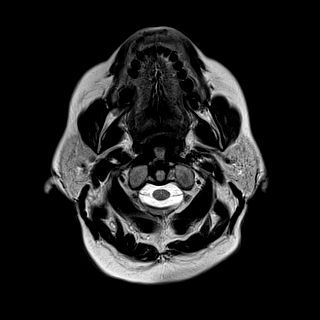
[im 27/27]
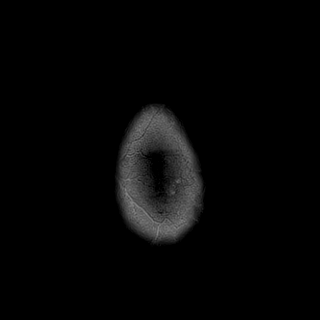

[Series 11: FLAIR · axial · 5.0mm · 0.45mm/px · z∈[-101,+53]mm · 2 of 27 slices shown]
[im 1/27]
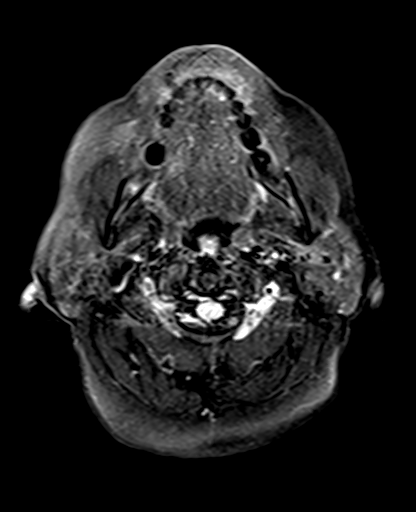
[im 27/27]
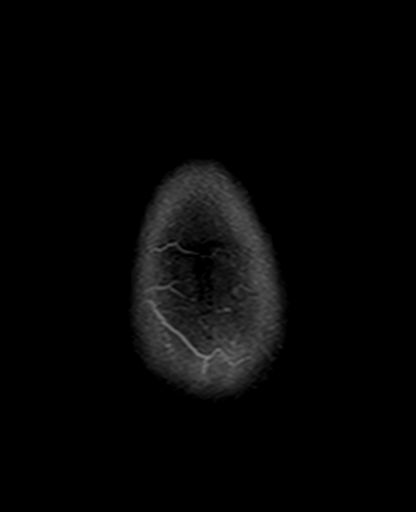

[Series 12: mag_images · axial · 3.0mm · 0.90mm/px · z∈[-112,+64]mm · 4 of 60 slices shown]
[im 1/60]
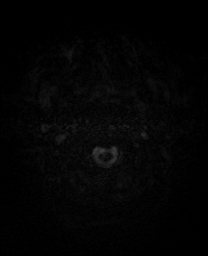
[im 20/60]
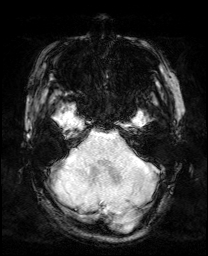
[im 40/60]
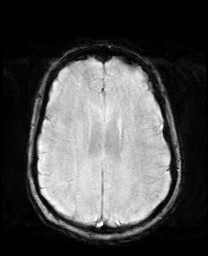
[im 60/60]
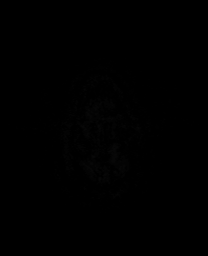

[Series 13: pha_images · axial · 3.0mm · 0.90mm/px · z∈[-112,+58]mm · 4 of 58 slices shown]
[im 1/58]
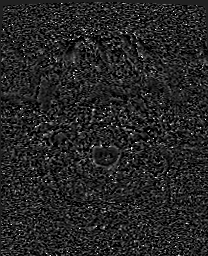
[im 20/58]
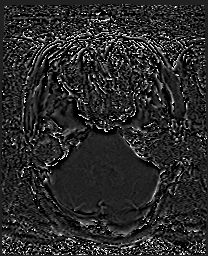
[im 39/58]
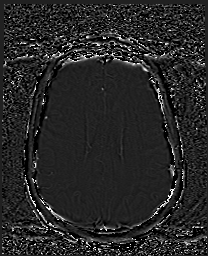
[im 58/58]
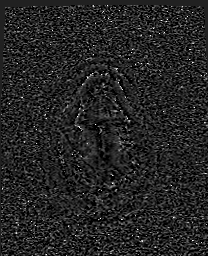

[Series 14: swi_images · axial · 3.0mm · 0.90mm/px · z∈[-112,+64]mm · 4 of 60 slices shown]
[im 1/60]
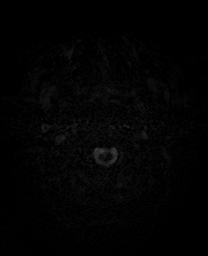
[im 20/60]
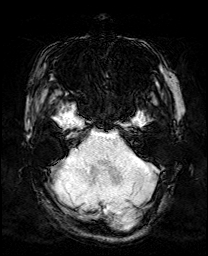
[im 40/60]
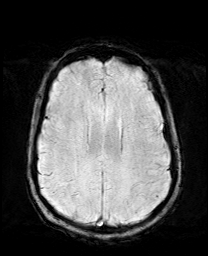
[im 60/60]
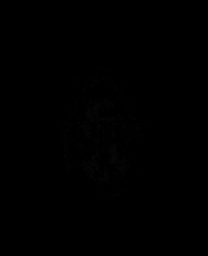

[Series 15: mip_images(sw) · axial · 24.0mm · 0.90mm/px · z∈[-101,+53]mm · 3 of 53 slices shown]
[im 1/53]
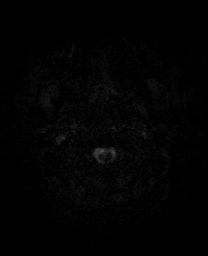
[im 27/53]
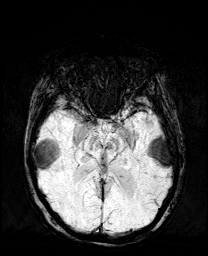
[im 53/53]
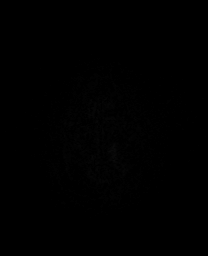

[Series 17: T2 post-contrast · coronal · 5.0mm · 0.72mm/px · 2 of 29 slices shown]
[im 1/29]
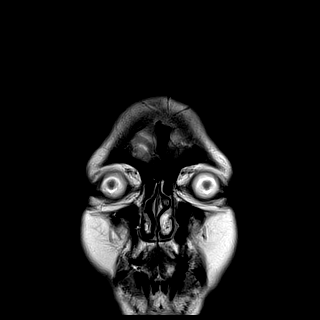
[im 29/29]
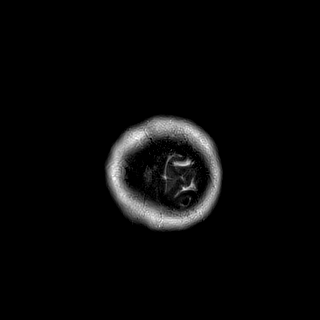

[Series 19: T1 post-contrast · coronal · 5.0mm · 0.34mm/px · 2 of 29 slices shown (1 of 2)]
[im 1/29]
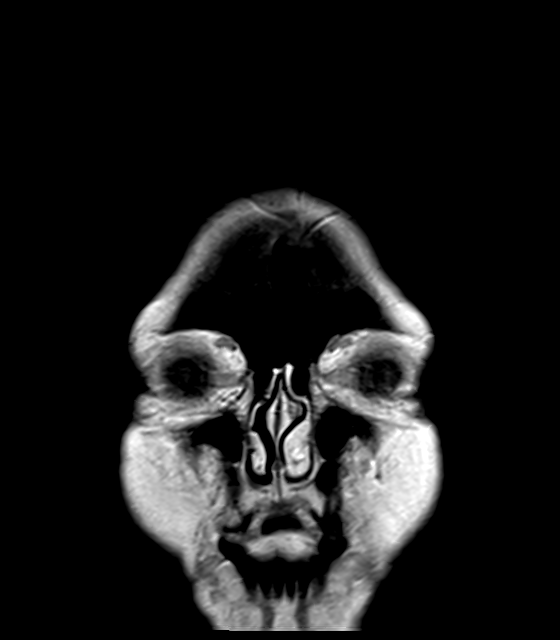
[im 29/29]
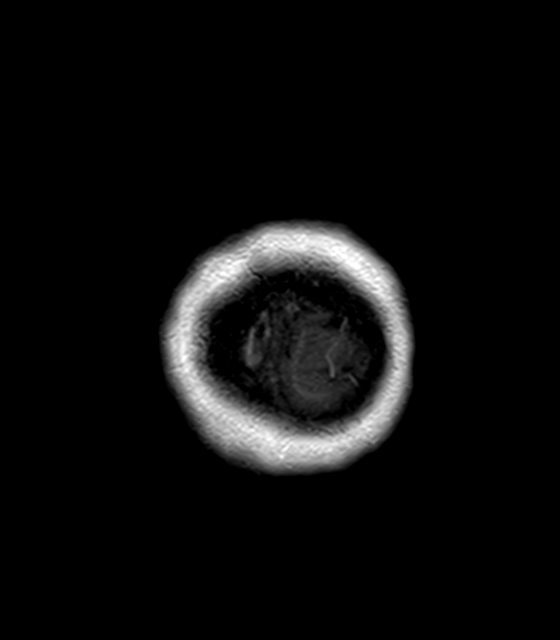

[Series 20: T1 post-contrast · sagittal · 5.0mm · 0.72mm/px · 2 of 25 slices shown (2 of 2)]
[im 1/25]
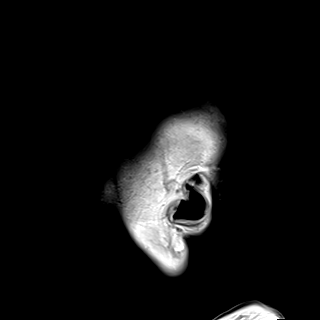
[im 25/25]
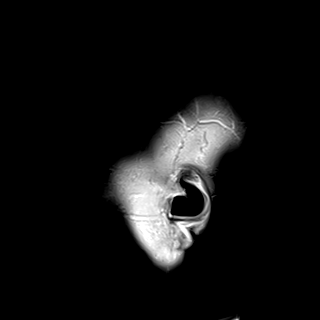

[40 of 48 positions shown; findings below may reference images not displayed]

FINDINGS: Brain: No acute infarct, mass effect or extra-axial collection.
Extra-axial mass of the right middle cranial fossa measures 2.7 x
1.7 x 2.1 cm and shows diffuse contrast enhancement. Normal white
matter signal, parenchymal volume and CSF spaces. The midline
structures are normal.

Vascular: Major flow voids are preserved.

Skull and upper cervical spine: Normal calvarium and skull base.
Visualized upper cervical spine and soft tissues are normal.

Sinuses/Orbits:No paranasal sinus fluid levels or advanced mucosal
thickening. No mastoid or middle ear effusion. Normal orbits.
IMPRESSION: 2.7 x 1.7 x 2.1 cm meningioma of the right middle cranial fossa.
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
## 2021-01-15 MED ORDER — SODIUM CHLORIDE 0.9 % IV BOLUS
1000.0000 mL | Freq: Once | INTRAVENOUS | Status: AC
  Administered 2021-01-15: 10:00:00 1000 mL via INTRAVENOUS

## 2021-01-15 MED ORDER — KETOROLAC TROMETHAMINE 15 MG/ML IJ SOLN
15.0000 mg | Freq: Four times a day (QID) | INTRAMUSCULAR | Status: DC
  Administered 2021-01-16 – 2021-01-17 (×6): 15 mg via INTRAVENOUS
  Filled 2021-01-15 (×6): qty 1

## 2021-01-15 MED ORDER — IOHEXOL 350 MG/ML SOLN
50.0000 mL | Freq: Once | INTRAVENOUS | Status: AC | PRN
  Administered 2021-01-15: 14:00:00 50 mL via INTRAVENOUS

## 2021-01-15 MED ORDER — FENTANYL CITRATE PF 50 MCG/ML IJ SOSY
50.0000 ug | PREFILLED_SYRINGE | Freq: Once | INTRAMUSCULAR | Status: AC
  Administered 2021-01-15: 10:00:00 50 ug via INTRAVENOUS
  Filled 2021-01-15: qty 1

## 2021-01-15 MED ORDER — LORAZEPAM 2 MG/ML IJ SOLN
1.0000 mg | Freq: Once | INTRAMUSCULAR | Status: AC
  Administered 2021-01-15: 21:00:00 1 mg via INTRAVENOUS
  Filled 2021-01-15: qty 1

## 2021-01-15 MED ORDER — POLYETHYLENE GLYCOL 3350 17 G PO PACK: 17.0000 g | PACK | Freq: Every day | ORAL | Status: DC | PRN

## 2021-01-15 MED ORDER — SODIUM CHLORIDE 0.9% FLUSH
3.0000 mL | Freq: Two times a day (BID) | INTRAVENOUS | Status: DC
  Administered 2021-01-15 – 2021-01-17 (×4): 3 mL via INTRAVENOUS

## 2021-01-15 MED ORDER — ONDANSETRON HCL 4 MG/2ML IJ SOLN: 4.0000 mg | Freq: Four times a day (QID) | INTRAMUSCULAR | Status: DC | PRN

## 2021-01-15 MED ORDER — NICOTINE 14 MG/24HR TD PT24
14.0000 mg | MEDICATED_PATCH | Freq: Every day | TRANSDERMAL | Status: DC
  Administered 2021-01-15 – 2021-01-17 (×3): 14 mg via TRANSDERMAL
  Filled 2021-01-15 (×3): qty 1

## 2021-01-15 MED ORDER — ATORVASTATIN CALCIUM 10 MG PO TABS
10.0000 mg | ORAL_TABLET | Freq: Every day | ORAL | Status: DC
  Administered 2021-01-15 – 2021-01-16 (×2): 10 mg via ORAL
  Filled 2021-01-15 (×2): qty 1

## 2021-01-15 MED ORDER — BISACODYL 5 MG PO TBEC: 5.0000 mg | DELAYED_RELEASE_TABLET | Freq: Every day | ORAL | Status: DC | PRN

## 2021-01-15 MED ORDER — LISINOPRIL 20 MG PO TABS
20.0000 mg | ORAL_TABLET | Freq: Every day | ORAL | Status: DC
  Administered 2021-01-15: 22:00:00 20 mg via ORAL
  Filled 2021-01-15: qty 1

## 2021-01-15 MED ORDER — FENTANYL CITRATE PF 50 MCG/ML IJ SOSY
50.0000 ug | PREFILLED_SYRINGE | Freq: Once | INTRAMUSCULAR | Status: AC
  Administered 2021-01-15: 11:00:00 50 ug via INTRAVENOUS
  Filled 2021-01-15: qty 1

## 2021-01-15 MED ORDER — HYDROCHLOROTHIAZIDE 25 MG PO TABS
25.0000 mg | ORAL_TABLET | Freq: Every day | ORAL | Status: DC
  Administered 2021-01-15: 22:00:00 25 mg via ORAL
  Filled 2021-01-15: qty 1

## 2021-01-15 MED ORDER — ONDANSETRON HCL 4 MG PO TABS: 4.0000 mg | ORAL_TABLET | Freq: Four times a day (QID) | ORAL | Status: DC | PRN

## 2021-01-15 MED ORDER — DOCUSATE SODIUM 100 MG PO CAPS
100.0000 mg | ORAL_CAPSULE | Freq: Two times a day (BID) | ORAL | Status: DC
  Administered 2021-01-15 – 2021-01-16 (×2): 100 mg via ORAL
  Filled 2021-01-15 (×4): qty 1

## 2021-01-15 MED ORDER — MORPHINE SULFATE (PF) 2 MG/ML IV SOLN
2.0000 mg | INTRAVENOUS | Status: DC | PRN
  Administered 2021-01-15 (×3): 2 mg via INTRAVENOUS
  Filled 2021-01-15 (×3): qty 1

## 2021-01-15 MED ORDER — METHOCARBAMOL 500 MG PO TABS
1000.0000 mg | ORAL_TABLET | Freq: Three times a day (TID) | ORAL | Status: DC
  Administered 2021-01-15 – 2021-01-17 (×5): 1000 mg via ORAL
  Filled 2021-01-15 (×5): qty 2

## 2021-01-15 MED ORDER — GADOBUTROL 1 MMOL/ML IV SOLN
10.0000 mL | Freq: Once | INTRAVENOUS | Status: AC | PRN
  Administered 2021-01-15: 21:00:00 10 mL via INTRAVENOUS

## 2021-01-15 MED ORDER — MORPHINE SULFATE (PF) 2 MG/ML IV SOLN: 2.0000 mg | INTRAVENOUS | Status: DC | PRN

## 2021-01-15 MED ORDER — ACETAMINOPHEN 650 MG RE SUPP: 650.0000 mg | Freq: Four times a day (QID) | RECTAL | Status: DC | PRN

## 2021-01-15 MED ORDER — LISINOPRIL-HYDROCHLOROTHIAZIDE 20-25 MG PO TABS: 1.0000 | ORAL_TABLET | Freq: Every day | ORAL | Status: DC

## 2021-01-15 MED ORDER — HYDROMORPHONE HCL 1 MG/ML IJ SOLN
1.0000 mg | Freq: Once | INTRAMUSCULAR | Status: AC
  Administered 2021-01-15: 11:00:00 1 mg via INTRAVENOUS
  Filled 2021-01-15: qty 1

## 2021-01-15 MED ORDER — LACTATED RINGERS IV SOLN: INTRAVENOUS | Status: DC

## 2021-01-15 MED ORDER — ACETAMINOPHEN 500 MG PO TABS
1000.0000 mg | ORAL_TABLET | Freq: Four times a day (QID) | ORAL | Status: DC
  Administered 2021-01-15 – 2021-01-17 (×7): 1000 mg via ORAL
  Filled 2021-01-15 (×7): qty 2

## 2021-01-15 MED ORDER — ESCITALOPRAM OXALATE 10 MG PO TABS
10.0000 mg | ORAL_TABLET | Freq: Every day | ORAL | Status: DC
  Administered 2021-01-15 – 2021-01-16 (×2): 10 mg via ORAL
  Filled 2021-01-15 (×2): qty 1

## 2021-01-15 MED ORDER — IOHEXOL 300 MG/ML  SOLN
100.0000 mL | Freq: Once | INTRAMUSCULAR | Status: AC | PRN
  Administered 2021-01-15: 10:00:00 100 mL via INTRAVENOUS

## 2021-01-15 MED ORDER — ONDANSETRON HCL 4 MG/2ML IJ SOLN
4.0000 mg | Freq: Once | INTRAMUSCULAR | Status: AC
  Administered 2021-01-15: 10:00:00 4 mg via INTRAVENOUS
  Filled 2021-01-15: qty 2

## 2021-01-15 MED ORDER — ACETAMINOPHEN 325 MG PO TABS: 650.0000 mg | ORAL_TABLET | Freq: Four times a day (QID) | ORAL | Status: DC | PRN

## 2021-01-15 MED ORDER — OXYCODONE HCL 5 MG/5ML PO SOLN
5.0000 mg | ORAL | Status: DC | PRN
  Administered 2021-01-15 – 2021-01-16 (×2): 10 mg via ORAL
  Filled 2021-01-15 (×2): qty 10

## 2021-01-15 MED ORDER — TRAZODONE HCL 50 MG PO TABS
50.0000 mg | ORAL_TABLET | Freq: Every evening | ORAL | Status: DC | PRN
  Administered 2021-01-15 – 2021-01-16 (×2): 50 mg via ORAL
  Filled 2021-01-15 (×2): qty 1

## 2021-01-15 NOTE — Assessment & Plan Note (Signed)
-  Patient's (adult) son disappeared 18 months ago and she has had persistent mood disturbance since -She started Lexapro about 9 months ago -Continue Lexapro -This appears unrelated to current admission
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
## 2021-01-15 NOTE — ED Notes (Signed)
CT called

## 2021-01-15 NOTE — Assessment & Plan Note (Signed)
-  Patient understands need to quit and desire to do so -She does have changes of emphysema on CT -Smoking cessation is essential, counseling of 3+ minutes provided -Nicotine patch ordered

## 2021-01-15 NOTE — Assessment & Plan Note (Signed)
-  Resume BP meds (Zestoretic) at bedtime -She did have a hypotensive episode upon arrival in the ER but BP has normalized
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
## 2021-01-15 NOTE — Assessment & Plan Note (Signed)
-  Continue Lipitor °

## 2021-01-15 NOTE — Assessment & Plan Note (Signed)
-  She reports R-sided chest pressure periodically, and a very severe episode on Christmas Day -Aortic atherosclerosis noted on CT so she is at increased risk for ASCVD -Initial troponin negative as well as repeat -EKG is non-ischemic -Echo ordered for syncope evaluation -Otherwise no additional evaluation is currently planned for this issue
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
## 2021-01-15 NOTE — Progress Notes (Signed)
°  Echocardiogram 2D Echocardiogram has been performed.  Johny Chess 01/15/2021, 3:55 PM
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
## 2021-01-15 NOTE — Assessment & Plan Note (Signed)
-  Very large hiatal hernia noted on imaging -She has been having significant dysphagia, although not reflux symptoms -She is likely to need urgent repair sometime following this hospitalization -Her R-sided chest pressure may be related to this issue

## 2021-01-15 NOTE — Assessment & Plan Note (Signed)
-  Small nodule incidentally noted on CT -She has stable prior nodules -Will need f/u CT in 12 months

## 2021-01-15 NOTE — Assessment & Plan Note (Signed)
-  Body mass index is 42.91 kg/m..  -Weight loss should be encouraged -Outpatient PCP/bariatric medicine/bariatric surgery f/u encouraged

## 2021-01-15 NOTE — Progress Notes (Signed)
EEG done at bedside. No skin breakdown noted. Pening results. Pending second MRI to be done.

## 2021-01-15 NOTE — Assessment & Plan Note (Signed)
-  She was taken off thyroid medication by her PCP because she was doing well -It appears that she needs this again - but this could also be sick euthyroid in the setting of stress response -Suggest outpatient f/u for probable resumption of medication
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
## 2021-01-15 NOTE — ED Notes (Signed)
Patient transported to CT 

## 2021-01-15 NOTE — Assessment & Plan Note (Signed)
-  She has been told in the past that she has pre-diabetes -She had PCP blood work about 2 weeks ago -Will have patient f/u as an outpatient unless fasting glucose on labs indicates a need for sooner intervention

## 2021-01-16 ENCOUNTER — Observation Stay (HOSPITAL_COMMUNITY): Payer: No Typology Code available for payment source

## 2021-01-16 DIAGNOSIS — Z20822 Contact with and (suspected) exposure to covid-19: Secondary | ICD-10-CM | POA: Diagnosis not present

## 2021-01-16 DIAGNOSIS — E038 Other specified hypothyroidism: Secondary | ICD-10-CM

## 2021-01-16 DIAGNOSIS — R55 Syncope and collapse: Secondary | ICD-10-CM | POA: Diagnosis not present

## 2021-01-16 DIAGNOSIS — S2241XA Multiple fractures of ribs, right side, initial encounter for closed fracture: Secondary | ICD-10-CM | POA: Diagnosis not present

## 2021-01-16 DIAGNOSIS — R413 Other amnesia: Secondary | ICD-10-CM

## 2021-01-16 DIAGNOSIS — R079 Chest pain, unspecified: Secondary | ICD-10-CM | POA: Diagnosis not present

## 2021-01-16 DIAGNOSIS — I959 Hypotension, unspecified: Secondary | ICD-10-CM

## 2021-01-16 DIAGNOSIS — F4323 Adjustment disorder with mixed anxiety and depressed mood: Secondary | ICD-10-CM | POA: Diagnosis not present

## 2021-01-16 DIAGNOSIS — S32009A Unspecified fracture of unspecified lumbar vertebra, initial encounter for closed fracture: Secondary | ICD-10-CM | POA: Diagnosis not present

## 2021-01-16 DIAGNOSIS — S301XXA Contusion of abdominal wall, initial encounter: Secondary | ICD-10-CM | POA: Diagnosis not present

## 2021-01-16 DIAGNOSIS — G9389 Other specified disorders of brain: Secondary | ICD-10-CM | POA: Diagnosis not present

## 2021-01-16 DIAGNOSIS — I10 Essential (primary) hypertension: Secondary | ICD-10-CM

## 2021-01-16 LAB — BASIC METABOLIC PANEL
Anion gap: 8 (ref 5–15)
BUN: 12 mg/dL (ref 6–20)
CO2: 25 mmol/L (ref 22–32)
Calcium: 8.2 mg/dL — ABNORMAL LOW (ref 8.9–10.3)
Chloride: 102 mmol/L (ref 98–111)
Creatinine, Ser: 0.92 mg/dL (ref 0.44–1.00)
GFR, Estimated: >60 mL/min (ref >60)
Glucose, Bld: 112 mg/dL — ABNORMAL HIGH (ref 70–99)
Potassium: 3.7 mmol/L (ref 3.5–5.1)
Sodium: 135 mmol/L (ref 135–145)

## 2021-01-16 LAB — CBC
HCT: 40.3 % (ref 36.0–46.0)
Hemoglobin: 13.6 g/dL (ref 12.0–15.0)
MCH: 31.7 pg (ref 26.0–34.0)
MCHC: 33.7 g/dL (ref 30.0–36.0)
MCV: 93.9 fL (ref 80.0–100.0)
Platelets: 220 10*3/uL (ref 150–400)
RBC: 4.29 MIL/uL (ref 3.87–5.11)
RDW: 13 % (ref 11.5–15.5)
WBC: 7.7 10*3/uL (ref 4.0–10.5)
nRBC: 0 % (ref 0.0–0.2)
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
## 2021-01-16 MED ORDER — MORPHINE SULFATE (PF) 2 MG/ML IV SOLN
1.0000 mg | INTRAVENOUS | Status: DC | PRN
Start: 2021-01-16 — End: 2021-01-17

## 2021-01-16 MED ORDER — OXYCODONE HCL 5 MG PO TABS
5.0000 mg | ORAL_TABLET | ORAL | Status: DC | PRN
  Administered 2021-01-16 – 2021-01-17 (×6): 10 mg via ORAL
  Filled 2021-01-16 (×6): qty 2

## 2021-01-16 NOTE — Progress Notes (Signed)
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 days
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
## 2021-01-16 NOTE — Procedures (Signed)
Patient Name: Kelly [name]  MRN: [id number]  Epilepsy Attending: Lora Havens  Referring Physician/Provider: Dr Karmen Bongo Date: 01/15/2021 Duration: 20.37 mins  Patient history: 55 year old female with syncope.  EEG to evaluate for seizures.  Level of alertness: Awake  AEDs during EEG study:   Technical aspects: This EEG study was done with scalp electrodes positioned according to the 10-20 International system of electrode placement. Electrical activity was acquired at a sampling rate of 500Hz  and reviewed with a high frequency filter of 70Hz  and a low frequency filter of 1Hz . EEG data were recorded continuously and digitally stored.   Description: The posterior dominant rhythm consists of 8-9Hz  activity of moderate voltage (25-35 uV) seen predominantly in posterior head regions, symmetric and reactive to eye opening and eye closing. Physiologic photic driving was not seen during photic stimulation. Hyperventilation and photic stimulation were not performed.     IMPRESSION: This study is within normal limits. No seizures or epileptiform discharges were seen throughout the recording.  [name]
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
## 2021-01-17 DIAGNOSIS — R911 Solitary pulmonary nodule: Secondary | ICD-10-CM

## 2021-01-17 DIAGNOSIS — I10 Essential (primary) hypertension: Secondary | ICD-10-CM | POA: Diagnosis not present

## 2021-01-17 DIAGNOSIS — R079 Chest pain, unspecified: Secondary | ICD-10-CM | POA: Diagnosis not present

## 2021-01-17 DIAGNOSIS — G9389 Other specified disorders of brain: Secondary | ICD-10-CM | POA: Diagnosis not present

## 2021-01-17 DIAGNOSIS — K449 Diaphragmatic hernia without obstruction or gangrene: Secondary | ICD-10-CM

## 2021-01-17 MED ORDER — OXYCODONE HCL 5 MG PO TABS
5.0000 mg | ORAL_TABLET | Freq: Four times a day (QID) | ORAL | 0 refills | Status: DC | PRN
Start: 2021-01-17 — End: 2021-03-07

## 2021-01-17 MED ORDER — NICOTINE 14 MG/24HR TD PT24: 14.0000 mg | MEDICATED_PATCH | Freq: Every day | TRANSDERMAL | 0 refills | Status: DC

## 2021-01-17 MED ORDER — LISINOPRIL 10 MG PO TABS: 10.0000 mg | ORAL_TABLET | Freq: Every day | ORAL | 2 refills | Status: DC
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
## 2021-01-17 NOTE — Discharge Summary (Signed)
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 Providers: IncredibleEmployment.be  This test is not yet approved or cleared by the [place] FDA and has been authorized for detection and/or diagnosis of SARS-CoV-2 by FDA under an Emergency Use Authorization (EUA). This EUA will remain in effect (meaning this test can be used) for the duration of the COVID-19 declaration under Section 564(b)(1) of the Act, 21 U.S.C. section 360bbb-3(b)(1), unless the authorization is terminated or revoked.  Performed at [facility], [place] [address] [place], [place] [address]      Labs: Basic Metabolic Panel:  Recent Labs  Lab 01/15/21 0944 01/16/21 0305  NA 137 135  K 3.9 3.7  CL 104 102  CO2 22 25  GLUCOSE 148* 112*  BUN 15 12  CREATININE  1.03* 0.92  CALCIUM 9.1 8.2*   Liver Function Tests: Recent Labs  Lab 01/15/21 0944  AST 26  ALT 23  ALKPHOS 82  BILITOT 0.4  PROT 6.3*  ALBUMIN 3.6   No results for input(s): LIPASE, AMYLASE in the last 168 hours. No results for input(s): AMMONIA in the last 168 hours. CBC: Recent Labs  Lab 01/15/21 0944 01/16/21 0305  WBC 14.4* 7.7  NEUTROABS 11.4*  --   HGB 15.8* 13.6  HCT 47.2* 40.3  MCV 94.6 93.9  PLT 305 220   Cardiac Enzymes: No results for input(s): CKTOTAL, CKMB, CKMBINDEX, TROPONINI in the last 168 hours. BNP: BNP (last 3 results) No results for input(s): BNP in the last 8760 hours.  ProBNP (last 3 results) No results for input(s): PROBNP in the last 8760 hours.  CBG: Recent Labs  Lab 01/15/21 0949  GLUCAP 152*       Signed:  Oswald Hillock MD.  [facility] 01/17/2021, 9:22 AM

## 2021-01-17 NOTE — Discharge Instructions (Signed)
Pulmonary nodule You have a lung nodule seen on CT chest.  You will need to follow-up with your primary care doctor and repeat CT scan in 6 to 12 months to check for stability of the nodule.
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
## 2021-02-06 ENCOUNTER — Other Ambulatory Visit: Payer: Self-pay | Admitting: Neurosurgery

## 2021-02-13 ENCOUNTER — Other Ambulatory Visit: Payer: Self-pay | Admitting: Neurosurgery

## 2021-03-01 ENCOUNTER — Emergency Department (HOSPITAL_COMMUNITY): Payer: No Typology Code available for payment source

## 2021-03-01 ENCOUNTER — Inpatient Hospital Stay (HOSPITAL_COMMUNITY)
Admission: EM | Admit: 2021-03-01 | Discharge: 2021-03-07 | DRG: 025 | Disposition: A | Discharge status: Home / Self Care | Payer: No Typology Code available for payment source | Attending: Neurosurgery | Admitting: Neurosurgery

## 2021-03-01 ENCOUNTER — Other Ambulatory Visit: Payer: Self-pay

## 2021-03-01 ENCOUNTER — Encounter (HOSPITAL_COMMUNITY): Payer: Self-pay | Admitting: Emergency Medicine

## 2021-03-01 DIAGNOSIS — Z79899 Other long term (current) drug therapy: Secondary | ICD-10-CM

## 2021-03-01 DIAGNOSIS — Z888 Allergy status to other drugs, medicaments and biological substances status: Secondary | ICD-10-CM | POA: Diagnosis not present

## 2021-03-01 DIAGNOSIS — R4701 Aphasia: Secondary | ICD-10-CM | POA: Diagnosis present

## 2021-03-01 DIAGNOSIS — Z6839 Body mass index (BMI) 39.0-39.9, adult: Secondary | ICD-10-CM

## 2021-03-01 DIAGNOSIS — E039 Hypothyroidism, unspecified: Secondary | ICD-10-CM | POA: Diagnosis present

## 2021-03-01 DIAGNOSIS — Z8249 Family history of ischemic heart disease and other diseases of the circulatory system: Secondary | ICD-10-CM | POA: Diagnosis not present

## 2021-03-01 DIAGNOSIS — G935 Compression of brain: Secondary | ICD-10-CM | POA: Diagnosis present

## 2021-03-01 DIAGNOSIS — C719 Malignant neoplasm of brain, unspecified: Secondary | ICD-10-CM | POA: Diagnosis present

## 2021-03-01 DIAGNOSIS — I10 Essential (primary) hypertension: Secondary | ICD-10-CM | POA: Diagnosis present

## 2021-03-01 DIAGNOSIS — Z20822 Contact with and (suspected) exposure to covid-19: Secondary | ICD-10-CM | POA: Diagnosis present

## 2021-03-01 DIAGNOSIS — R4781 Slurred speech: Secondary | ICD-10-CM | POA: Diagnosis not present

## 2021-03-01 DIAGNOSIS — C712 Malignant neoplasm of temporal lobe: Secondary | ICD-10-CM | POA: Diagnosis not present

## 2021-03-01 DIAGNOSIS — F17210 Nicotine dependence, cigarettes, uncomplicated: Secondary | ICD-10-CM | POA: Diagnosis present

## 2021-03-01 DIAGNOSIS — D496 Neoplasm of unspecified behavior of brain: Secondary | ICD-10-CM | POA: Diagnosis not present

## 2021-03-01 DIAGNOSIS — R531 Weakness: Secondary | ICD-10-CM | POA: Diagnosis present

## 2021-03-01 DIAGNOSIS — E785 Hyperlipidemia, unspecified: Secondary | ICD-10-CM | POA: Diagnosis present

## 2021-03-01 DIAGNOSIS — F32A Depression, unspecified: Secondary | ICD-10-CM | POA: Diagnosis present

## 2021-03-01 DIAGNOSIS — Z83438 Family history of other disorder of lipoprotein metabolism and other lipidemia: Secondary | ICD-10-CM

## 2021-03-01 DIAGNOSIS — D509 Iron deficiency anemia, unspecified: Secondary | ICD-10-CM

## 2021-03-01 DIAGNOSIS — R900 Intracranial space-occupying lesion found on diagnostic imaging of central nervous system: Secondary | ICD-10-CM

## 2021-03-01 LAB — URINALYSIS, ROUTINE W REFLEX MICROSCOPIC
Bilirubin Urine: NEGATIVE
Glucose, UA: NEGATIVE mg/dL
Ketones, ur: NEGATIVE mg/dL
Nitrite: NEGATIVE
Protein, ur: NEGATIVE mg/dL
Specific Gravity, Urine: 1.011 (ref 1.005–1.030)
pH: 6 (ref 5.0–8.0)

## 2021-03-01 LAB — DIFFERENTIAL
Abs Immature Granulocytes: 0.03 10*3/uL (ref 0.00–0.07)
Basophils Absolute: 0 10*3/uL (ref 0.0–0.1)
Basophils Relative: 0 %
Eosinophils Absolute: 0 10*3/uL (ref 0.0–0.5)
Eosinophils Relative: 0 %
Immature Granulocytes: 0 %
Lymphocytes Relative: 21 %
Lymphs Abs: 2.2 10*3/uL (ref 0.7–4.0)
Monocytes Absolute: 0.6 10*3/uL (ref 0.1–1.0)
Monocytes Relative: 6 %
Neutro Abs: 7.5 10*3/uL (ref 1.7–7.7)
Neutrophils Relative %: 73 %

## 2021-03-01 LAB — COMPREHENSIVE METABOLIC PANEL
ALT: 21 U/L (ref 0–44)
AST: 12 U/L — ABNORMAL LOW (ref 15–41)
Albumin: 3.2 g/dL — ABNORMAL LOW (ref 3.5–5.0)
Alkaline Phosphatase: 116 U/L (ref 38–126)
Anion gap: 10 (ref 5–15)
BUN: 13 mg/dL (ref 6–20)
CO2: 23 mmol/L (ref 22–32)
Calcium: 8.9 mg/dL (ref 8.9–10.3)
Chloride: 106 mmol/L (ref 98–111)
Creatinine, Ser: 0.89 mg/dL (ref 0.44–1.00)
GFR, Estimated: >60 mL/min (ref >60)
Glucose, Bld: 113 mg/dL — ABNORMAL HIGH (ref 70–99)
Potassium: 3.4 mmol/L — ABNORMAL LOW (ref 3.5–5.1)
Sodium: 139 mmol/L (ref 135–145)
Total Bilirubin: 0.5 mg/dL (ref 0.3–1.2)
Total Protein: 6.9 g/dL (ref 6.5–8.1)

## 2021-03-01 LAB — RESP PANEL BY RT-PCR (FLU A&B, COVID) ARPGX2
Influenza A by PCR: NEGATIVE
Influenza B by PCR: NEGATIVE
SARS Coronavirus 2 by RT PCR: NEGATIVE

## 2021-03-01 LAB — CBC
HCT: 40.4 % (ref 36.0–46.0)
Hemoglobin: 13.3 g/dL (ref 12.0–15.0)
MCH: 30.4 pg (ref 26.0–34.0)
MCHC: 32.9 g/dL (ref 30.0–36.0)
MCV: 92.4 fL (ref 80.0–100.0)
Platelets: 345 10*3/uL (ref 150–400)
RBC: 4.37 MIL/uL (ref 3.87–5.11)
RDW: 12.2 % (ref 11.5–15.5)
WBC: 10.4 10*3/uL (ref 4.0–10.5)
nRBC: 0 % (ref 0.0–0.2)

## 2021-03-01 LAB — I-STAT BETA HCG BLOOD, ED (MC, WL, AP ONLY): I-stat hCG, quantitative: 7.5 m[IU]/mL — ABNORMAL HIGH (ref <5)

## 2021-03-01 LAB — ETHANOL: Alcohol, Ethyl (B): <10 mg/dL (ref <10)

## 2021-03-01 LAB — RAPID URINE DRUG SCREEN, HOSP PERFORMED
Amphetamines: NOT DETECTED
Barbiturates: POSITIVE — AB
Benzodiazepines: NOT DETECTED
Cocaine: NOT DETECTED
Opiates: POSITIVE — AB
Tetrahydrocannabinol: NOT DETECTED

## 2021-03-01 LAB — I-STAT CHEM 8, ED
BUN: 12 mg/dL (ref 6–20)
Calcium, Ion: 1.16 mmol/L (ref 1.15–1.40)
Chloride: 105 mmol/L (ref 98–111)
Creatinine, Ser: 0.8 mg/dL (ref 0.44–1.00)
Glucose, Bld: 117 mg/dL — ABNORMAL HIGH (ref 70–99)
HCT: 40 % (ref 36.0–46.0)
Hemoglobin: 13.6 g/dL (ref 12.0–15.0)
Potassium: 3.5 mmol/L (ref 3.5–5.1)
Sodium: 140 mmol/L (ref 135–145)
TCO2: 24 mmol/L (ref 22–32)

## 2021-03-01 LAB — APTT: aPTT: 31 seconds (ref 24–36)

## 2021-03-01 LAB — PROTIME-INR
INR: 1.1 (ref 0.8–1.2)
Prothrombin Time: 13.7 seconds (ref 11.4–15.2)

## 2021-03-01 IMAGING — MR MR HEAD WO/W CM
11 of 16 series · 25 of 48 positions shown · IV contrast (gadavist)
Comparison: Head CT [DATE] and MRI [DATE]

CLINICAL DATA: Brain/CNS neoplasm, staging. Worsening headaches and
mental status changes. Known right temporal mass.

EXAM:
MRI HEAD WITHOUT AND WITH CONTRAST
TECHNIQUE: Multiplanar, multiecho pulse sequences of the brain and surrounding
structures were obtained without and with intravenous contrast.
CONTRAST:  10mL GADAVIST GADOBUTROL 1 MMOL/ML IV SOLN

[Series 13: DWI · axial · 3.0mm · 0.88mm/px · z∈[-85,+56]mm · 3 of 96 slices shown (1 of 2)]
[im 1/96]
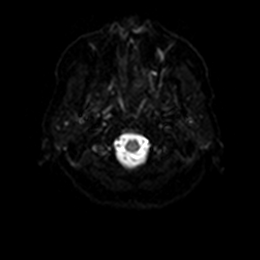
[im 48/96]
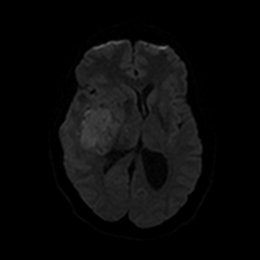
[im 96/96]
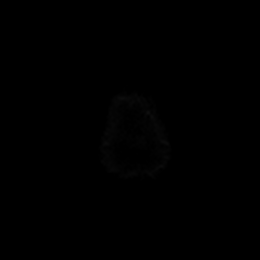

[Series 14: DWI · axial · 3.0mm · 0.88mm/px · z∈[-85,+56]mm · 2 of 48 slices shown (2 of 2)]
[im 1/48]
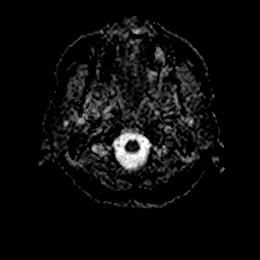
[im 48/48]
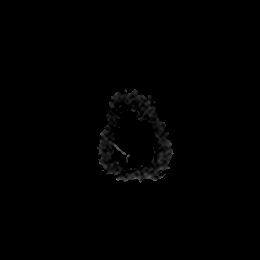

[Series 15: T1 · sagittal · 5.0mm · 0.75mm/px · 1 of 25 slices shown (1 of 2)]
[im 1/25]
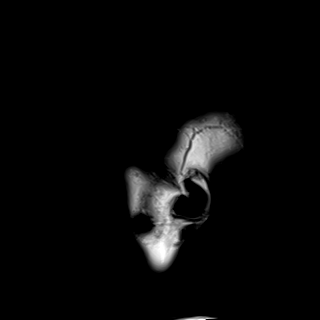

[Series 20: T1 · sagittal · 5.0mm · 0.75mm/px · 1 of 25 slices shown (2 of 2)]
[im 1/25]
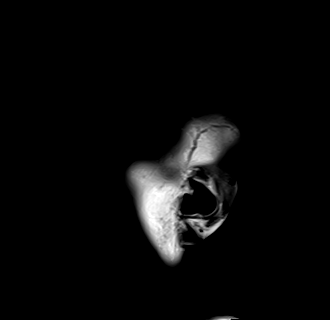

[Series 21: T2 · axial · 5.0mm · 0.72mm/px · 1 of 27 slices shown]
[im 1/27]
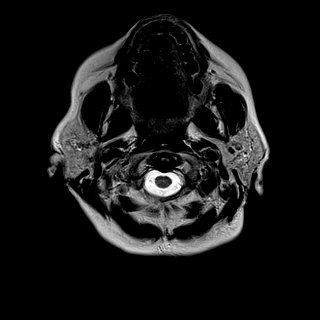

[Series 22: t2_space_dark-fluid_sag_p2_ns-ir · sagittal · 1.0mm · 0.49mm/px · 7 of 192 slices shown]
[im 1/192]
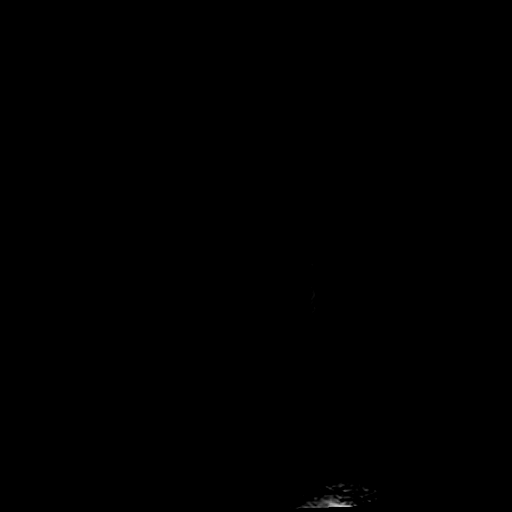
[im 32/192]
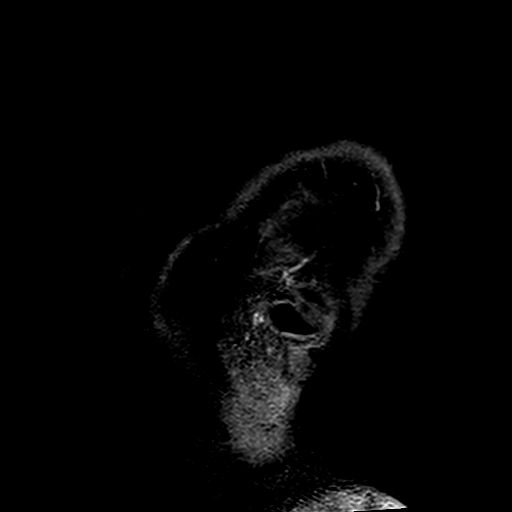
[im 64/192]
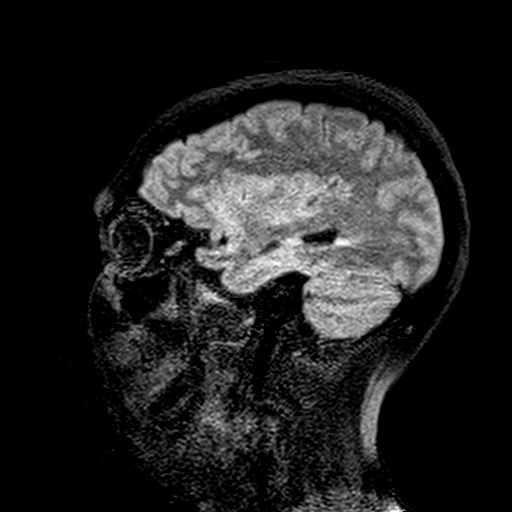
[im 96/192]
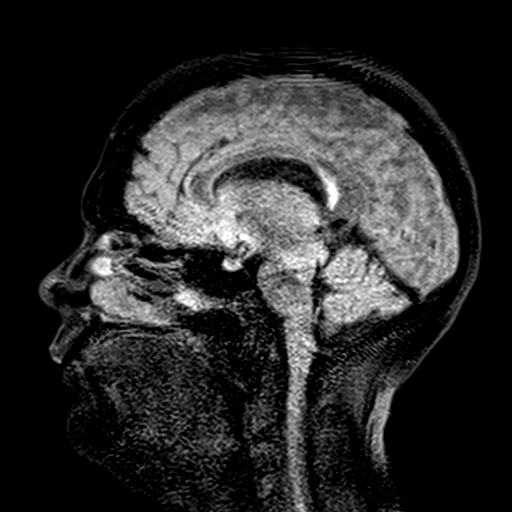
[im 128/192]
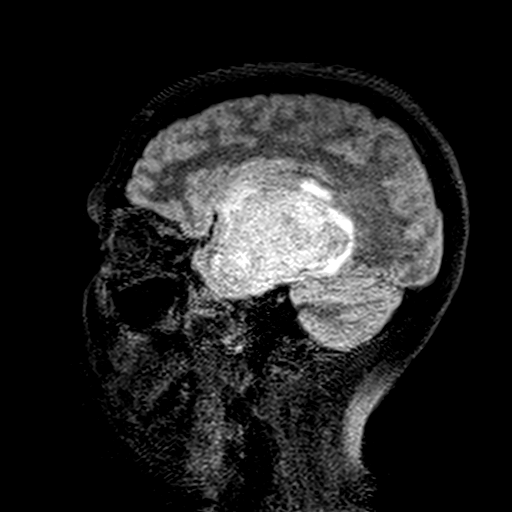
[im 160/192]
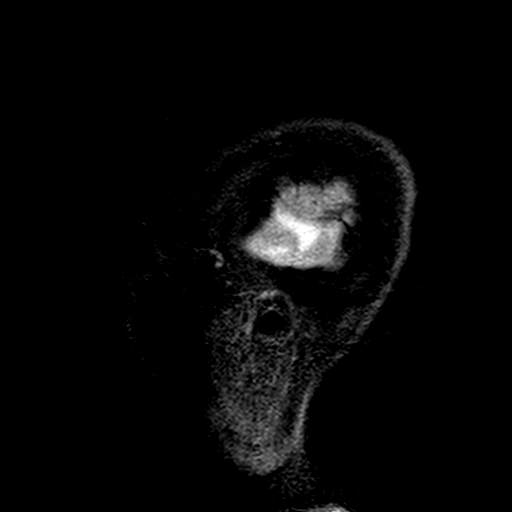
[im 192/192]
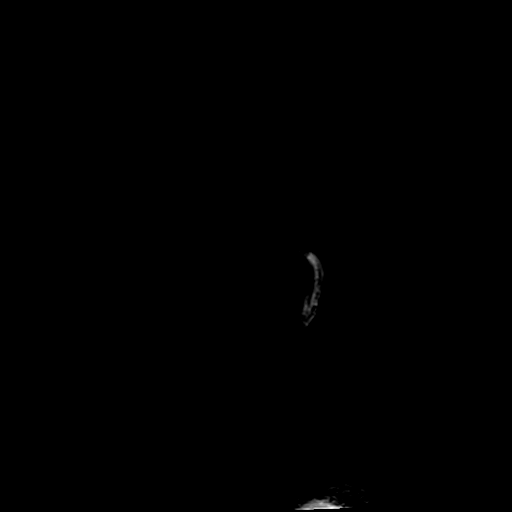

[Series 23: t2_space_dark-fluid_sag_p2_ns-ir_mpr_ axial · axial · 1.0mm · 0.45mm/px · z∈[-46,+78]mm · 5 of 125 slices shown]
[im 1/125]
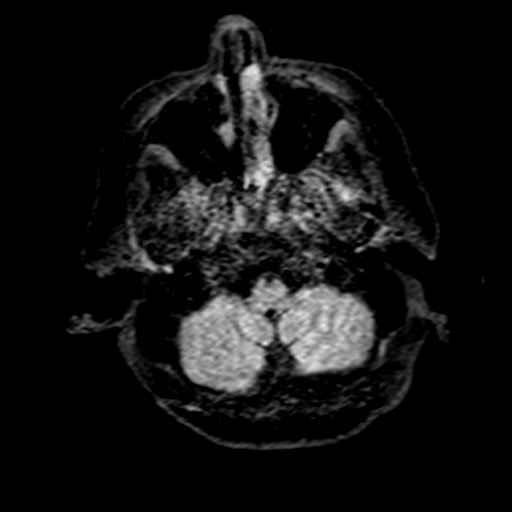
[im 32/125]
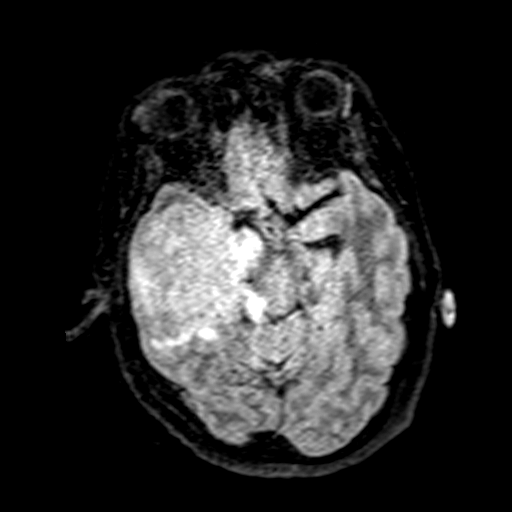
[im 63/125]
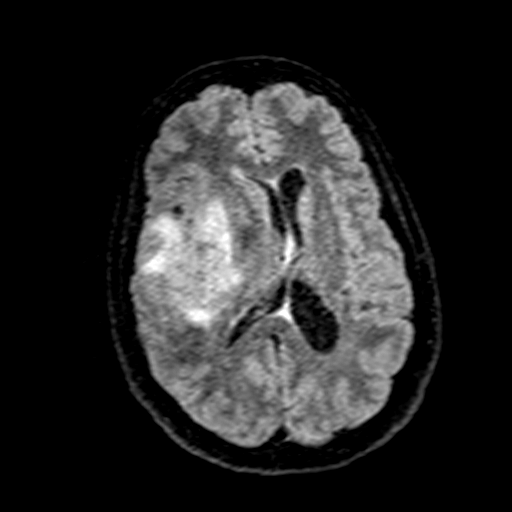
[im 94/125]
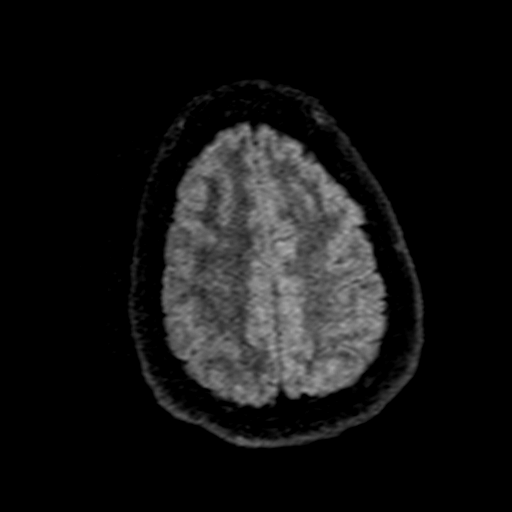
[im 125/125]
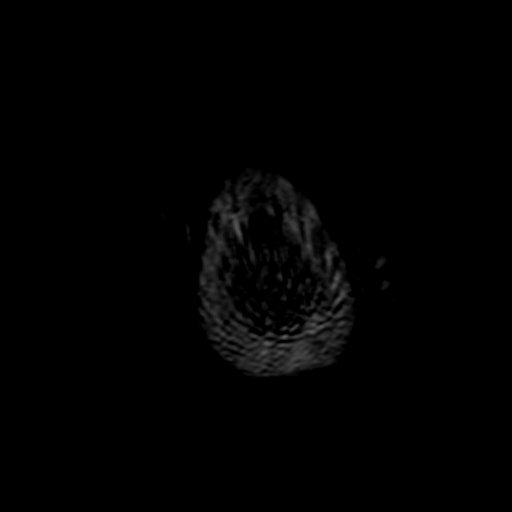

[Series 24: t2_space_dark-fluid_sag_p2_ns-ir_mpr_coronal · coronal · 1.0mm · 0.45mm/px · 2 of 120 slices shown]
[im 1/120]
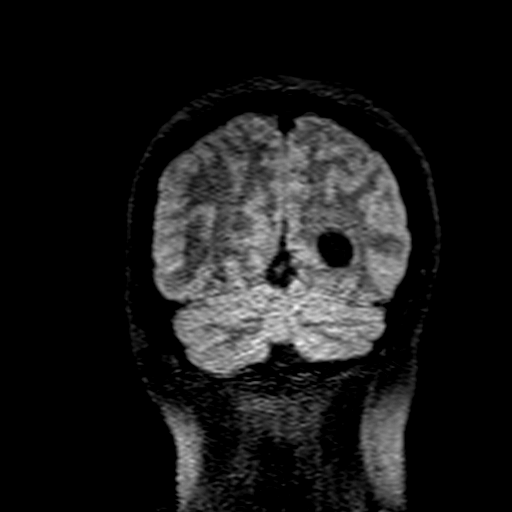
[im 30/120]
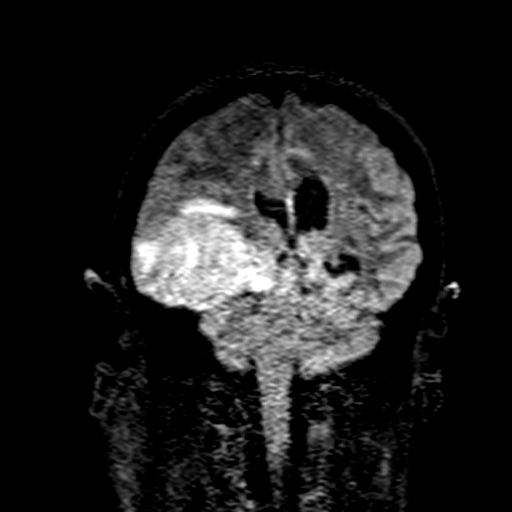

[Series 30: T2 post-contrast · coronal · 5.0mm · 0.72mm/px · 1 of 31 slices shown]
[im 1/31]
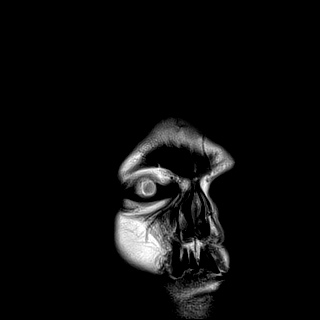

[Series 34: T1 post-contrast · sagittal · 4.0mm · 0.72mm/px · 1 of 31 slices shown (1 of 2)]
[im 1/31]
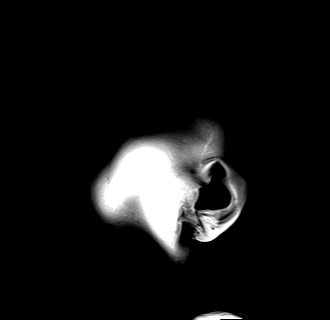

[Series 36: T1 post-contrast · coronal · 5.0mm · 0.34mm/px · 1 of 28 slices shown (2 of 2)]
[im 1/28]
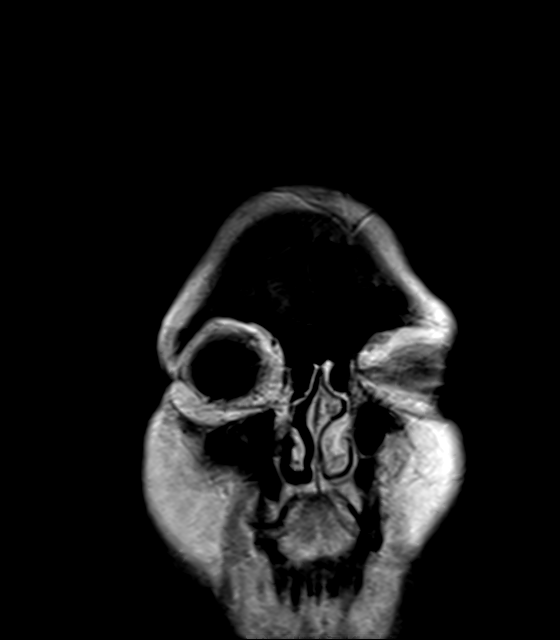

[25 of 48 positions shown; findings below may reference images not displayed]

FINDINGS: Some sequences are mildly to moderately motion degraded.

Brain: As seen on today's earlier head CT, a right temporal mass has
markedly enlarged since the [DATE] MRI. The mass now measures
6.0 x 4.1 x 5.3 cm and demonstrates thick, irregular peripheral
enhancement with central necrosis. There is moderate surrounding
edema, and there is prominent regional mass effect including on the
basal ganglia, right lateral ventricle, and midbrain with 1.1 cm of
leftward midline shift, unchanged from today's earlier CT. Mild
dilatation of the left lateral ventricle is consistent with
trapping, and there is a small amount of transependymal CSF flow
along the occipital horn. No second enhancing brain lesion is
identified. No acute infarct or extra-axial fluid collection is
evident.

Vascular: Major intracranial vascular flow voids are preserved.
Anterior displacement of the right MCA by the mass.

Skull and upper cervical spine: Unremarkable bone marrow signal.

Sinuses/Orbits: Unremarkable orbits. Paranasal sinuses and mastoid
air cells are clear.

Other: None.
IMPRESSION: 1. Marked enlargement of the right temporal mass since [DATE]
with moderate edema and prominent mass effect as above.
2. Trapping of the left lateral ventricle.

## 2021-03-01 IMAGING — CT CT HEAD W/O CM
4 series · 16 of 47 positions shown, 18 images · non-contrast
Comparison: None.

CLINICAL DATA: Neuro deficit



[Series 3: head without · axial · non-contrast · 0.43mm/px · z∈[-62,+53]mm · 7 of 31 slices shown, 9 images]
[im 4/31  brain]
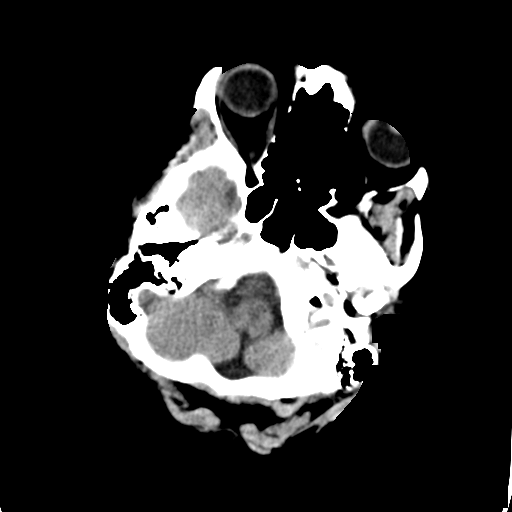
[im 4/31  bone]
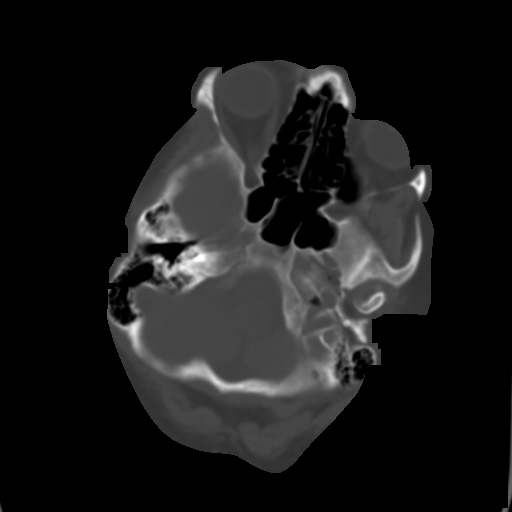
[im 8/31  brain]
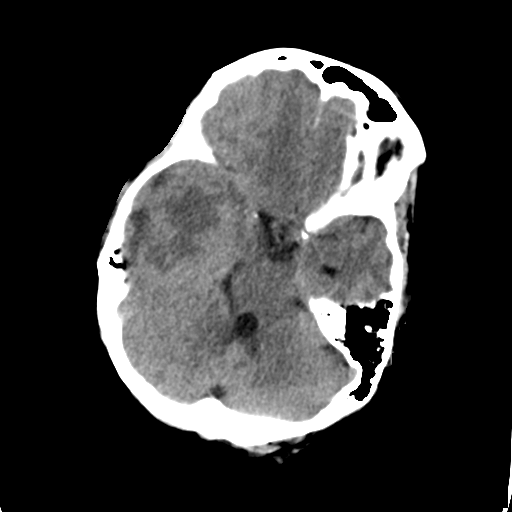
[im 12/31  brain]
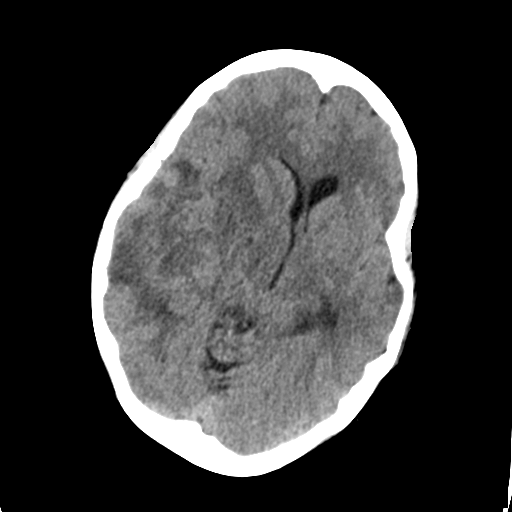
[im 16/31  brain]
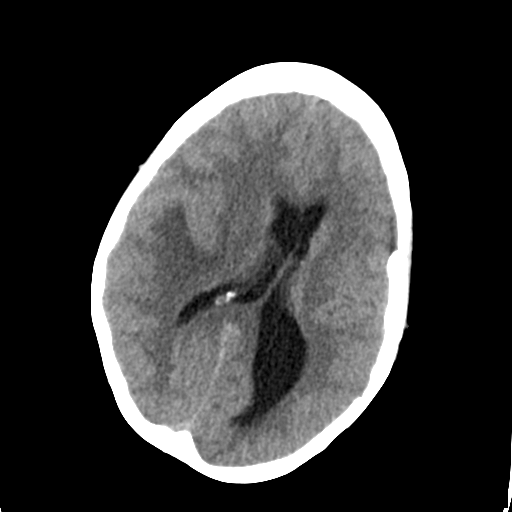
[im 19/31  brain]
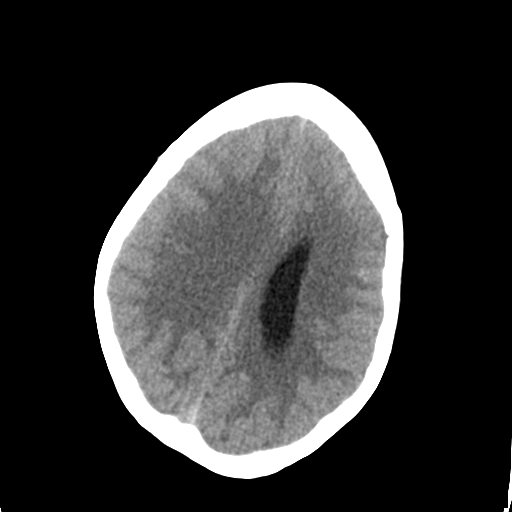
[im 19/31  bone]
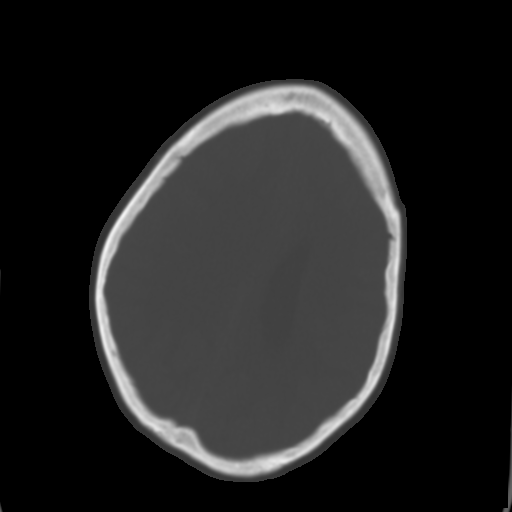
[im 23/31  brain]
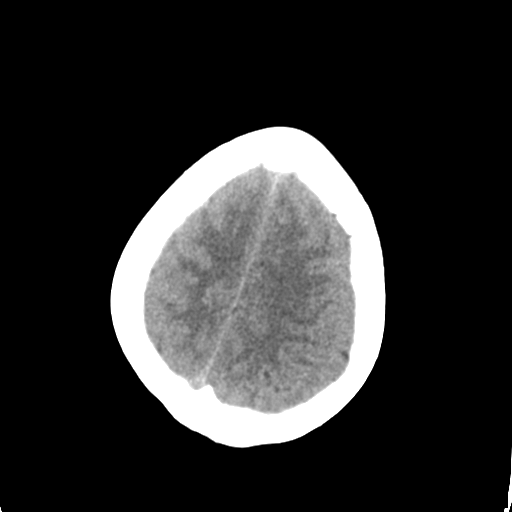
[im 27/31  brain]
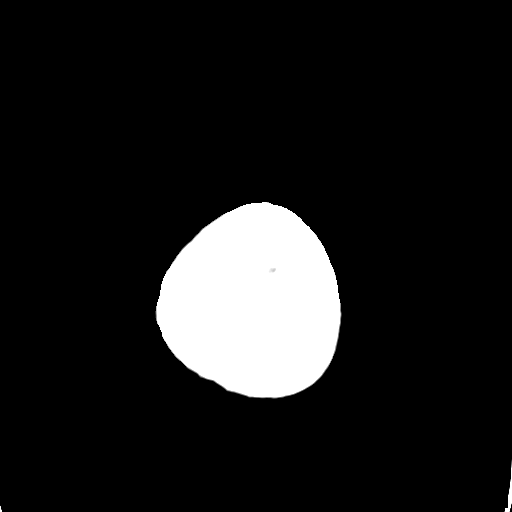

[Series 4: head bone · axial · 0.43mm/px · z∈[-63,-33]mm · 3 of 76 slices shown]
[im 8/76  bone]
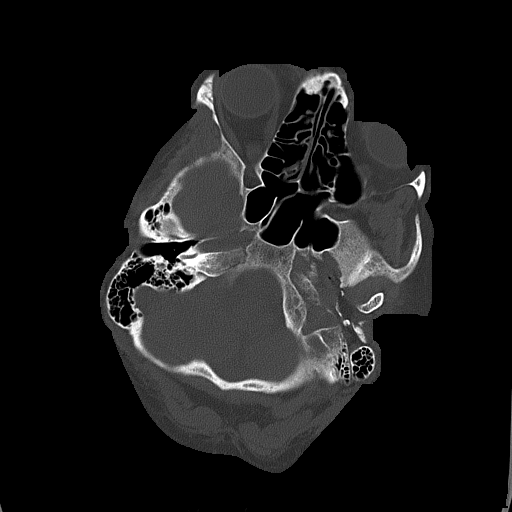
[im 16/76  bone]
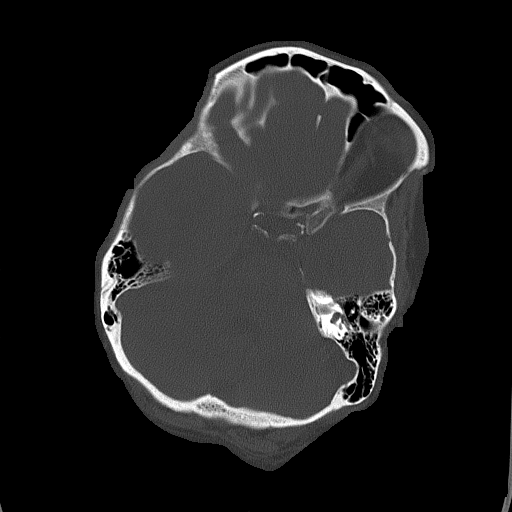
[im 23/76  bone]
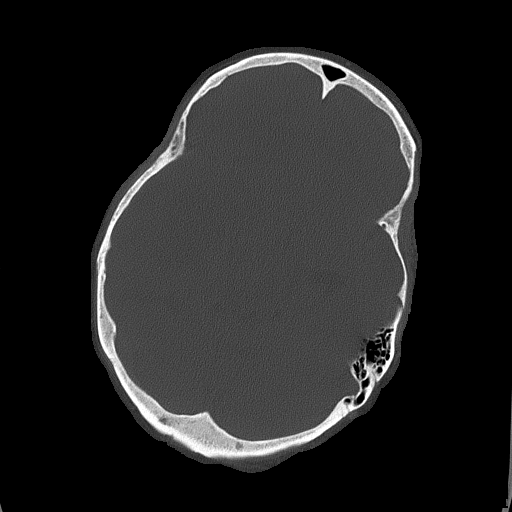

[Series 5: head without cor · coronal · non-contrast · 0.29mm/px · 3 of 67 slices shown]
[im 23/67  brain]
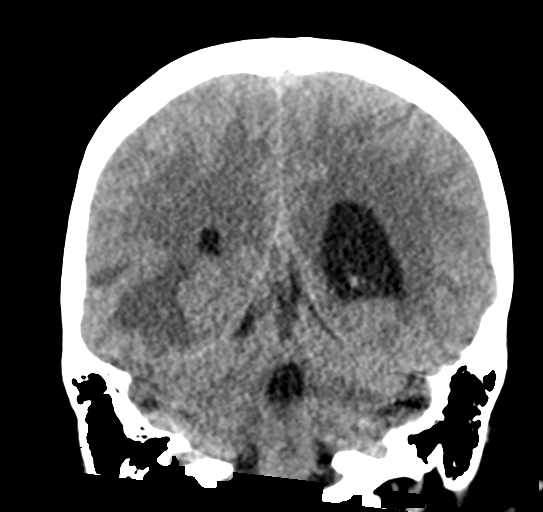
[im 30/67  brain]
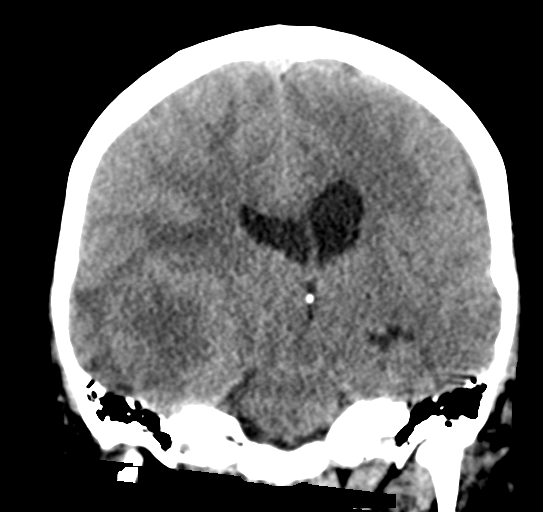
[im 37/67  brain]
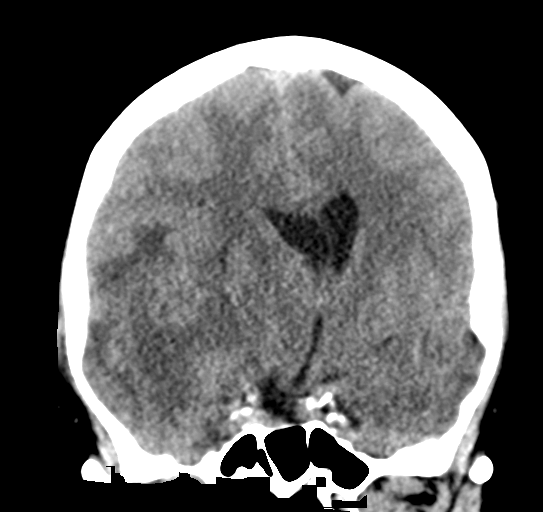

[Series 6: head without sag · sagittal · non-contrast · 0.29mm/px · 3 of 60 slices shown]
[im 20/60  brain]
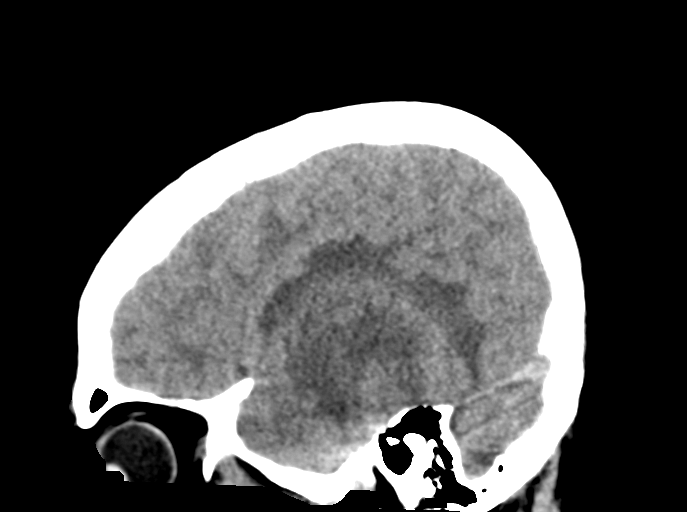
[im 30/60  brain]
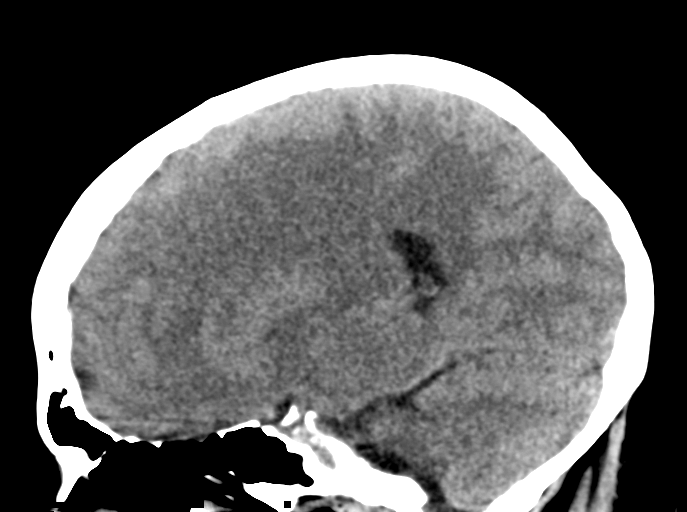
[im 40/60  brain]
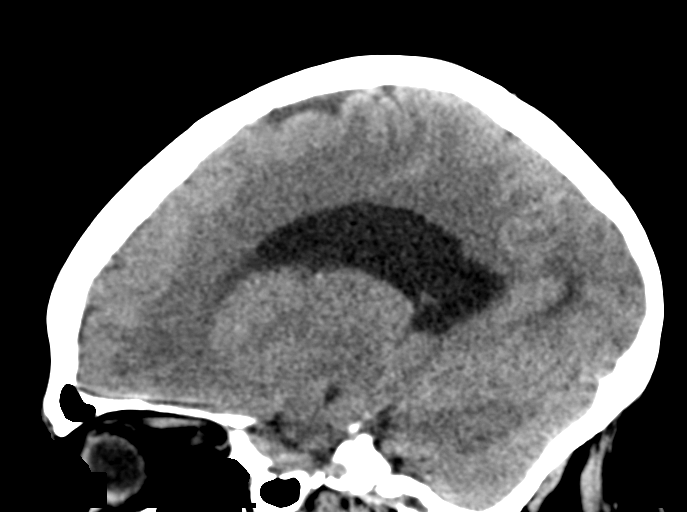

[16 of 47 positions shown; findings below may reference images not displayed]

FINDINGS: Brain: There is interval development of large area of low-density in
the right temporal and parietal lobes. There is extrinsic
compression of right lateral ventricle. There is a proximally 10 mm
shift of midline structures to the left. There are no signs of
bleeding within the cranium.

Vascular: Unremarkable.

Skull: No fracture is seen.

Sinuses/Orbits: Unremarkable.

Other: Small lipoma is seen in the anterior falx.
IMPRESSION: There is new large area of low attenuation in the right temporal and
parietal lobes. There is extrinsic compression of right lateral
ventricle approximately 10 mm shift of midline structures to the
left. Findings suggest possible neoplastic process with surrounding
edema and significant mass effect. Follow-up contrast enhanced CT or
MRI as warranted should be considered.

Imaging findings were relayed to patient's provider Dr. CLAUS HENRIK by
telephone call.

## 2021-03-01 MED ORDER — NICOTINE 14 MG/24HR TD PT24
14.0000 mg | MEDICATED_PATCH | Freq: Every day | TRANSDERMAL | Status: DC
  Administered 2021-03-01 – 2021-03-07 (×7): 14 mg via TRANSDERMAL
  Filled 2021-03-01 (×7): qty 1

## 2021-03-01 MED ORDER — GADOBUTROL 1 MMOL/ML IV SOLN
10.0000 mL | Freq: Once | INTRAVENOUS | Status: AC | PRN
  Administered 2021-03-01: 10 mL via INTRAVENOUS

## 2021-03-01 MED ORDER — TOPIRAMATE 25 MG PO TABS
50.0000 mg | ORAL_TABLET | Freq: Two times a day (BID) | ORAL | Status: DC
  Administered 2021-03-01 – 2021-03-07 (×12): 50 mg via ORAL
  Filled 2021-03-01 (×12): qty 2

## 2021-03-01 MED ORDER — LISINOPRIL 10 MG PO TABS
10.0000 mg | ORAL_TABLET | Freq: Every day | ORAL | Status: DC
Start: 2021-03-02 — End: 2021-03-07
  Administered 2021-03-02 – 2021-03-07 (×5): 10 mg via ORAL
  Filled 2021-03-01 (×6): qty 1

## 2021-03-01 MED ORDER — ONDANSETRON HCL 4 MG/2ML IJ SOLN
4.0000 mg | Freq: Four times a day (QID) | INTRAMUSCULAR | Status: DC | PRN
  Administered 2021-03-04: 4 mg via INTRAVENOUS

## 2021-03-01 MED ORDER — FERROUS SULFATE 325 (65 FE) MG PO TABS
325.0000 mg | ORAL_TABLET | Freq: Two times a day (BID) | ORAL | Status: DC
  Administered 2021-03-02 – 2021-03-07 (×10): 325 mg via ORAL
  Filled 2021-03-01 (×10): qty 1

## 2021-03-01 MED ORDER — POTASSIUM CHLORIDE IN NACL 40-0.9 MEQ/L-% IV SOLN
INTRAVENOUS | Status: DC
  Filled 2021-03-01 (×5): qty 1000

## 2021-03-01 MED ORDER — HYDROCODONE-ACETAMINOPHEN 5-325 MG PO TABS
1.0000 | ORAL_TABLET | Freq: Once | ORAL | Status: AC
  Administered 2021-03-01: 1 via ORAL
  Filled 2021-03-01: qty 1

## 2021-03-01 MED ORDER — POLYETHYLENE GLYCOL 3350 17 G PO PACK
17.0000 g | PACK | Freq: Every day | ORAL | Status: DC | PRN
  Administered 2021-03-06: 17 g via ORAL
  Filled 2021-03-01: qty 1

## 2021-03-01 MED ORDER — DEXAMETHASONE SODIUM PHOSPHATE 10 MG/ML IJ SOLN
8.0000 mg | Freq: Four times a day (QID) | INTRAMUSCULAR | Status: DC
  Administered 2021-03-01 – 2021-03-04 (×10): 8 mg via INTRAVENOUS
  Administered 2021-03-04: 20 mg via INTRAVENOUS
  Administered 2021-03-04: 8 mg via INTRAVENOUS
  Filled 2021-03-01 (×2): qty 0.8
  Filled 2021-03-01: qty 1
  Filled 2021-03-01: qty 0.8
  Filled 2021-03-01: qty 1
  Filled 2021-03-01: qty 0.8
  Filled 2021-03-01: qty 1
  Filled 2021-03-01 (×4): qty 0.8
  Filled 2021-03-01: qty 1

## 2021-03-01 MED ORDER — DOCUSATE SODIUM 100 MG PO CAPS: 100.0000 mg | ORAL_CAPSULE | Freq: Two times a day (BID) | ORAL | Status: DC | PRN

## 2021-03-01 MED ORDER — ESCITALOPRAM OXALATE 10 MG PO TABS
10.0000 mg | ORAL_TABLET | Freq: Every day | ORAL | Status: DC
  Administered 2021-03-01 – 2021-03-06 (×6): 10 mg via ORAL
  Filled 2021-03-01 (×6): qty 1

## 2021-03-01 MED ORDER — OXYCODONE HCL 5 MG PO TABS
5.0000 mg | ORAL_TABLET | ORAL | Status: DC | PRN
  Administered 2021-03-02 – 2021-03-07 (×18): 10 mg via ORAL
  Filled 2021-03-01 (×18): qty 2

## 2021-03-01 MED ORDER — ATORVASTATIN CALCIUM 10 MG PO TABS
10.0000 mg | ORAL_TABLET | Freq: Every day | ORAL | Status: DC
  Administered 2021-03-01 – 2021-03-06 (×6): 10 mg via ORAL
  Filled 2021-03-01 (×6): qty 1

## 2021-03-01 MED ORDER — PANTOPRAZOLE SODIUM 40 MG PO TBEC
40.0000 mg | DELAYED_RELEASE_TABLET | Freq: Two times a day (BID) | ORAL | Status: DC
  Administered 2021-03-01 – 2021-03-04 (×6): 40 mg via ORAL
  Filled 2021-03-01 (×6): qty 1

## 2021-03-01 MED ORDER — DEXAMETHASONE SODIUM PHOSPHATE 10 MG/ML IJ SOLN
10.0000 mg | Freq: Once | INTRAMUSCULAR | Status: AC
  Administered 2021-03-01: 10 mg via INTRAVENOUS
  Filled 2021-03-01: qty 1

## 2021-03-01 MED ORDER — LORAZEPAM 1 MG PO TABS
1.0000 mg | ORAL_TABLET | Freq: Once | ORAL | Status: AC | PRN
  Administered 2021-03-02: 1 mg via ORAL
  Filled 2021-03-01: qty 1

## 2021-03-01 MED ORDER — POLYETHYLENE GLYCOL 3350 17 G PO PACK: 17.0000 g | PACK | Freq: Every day | ORAL | Status: DC | PRN
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
## 2021-03-01 NOTE — ED Notes (Signed)
Pt remains in MRI, family remains in room.

## 2021-03-01 NOTE — ED Notes (Signed)
Patient transported to MRI 
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
## 2021-03-01 NOTE — ED Triage Notes (Signed)
Patient arrives ambulatory c/o headaches, confusion, slurred speech and facial droop. States she has had headaches going on for weeks but have been worse lately. The confusion and other symptoms started yesterday sometime in the afternoon. Reports a couple falls yesterday.

## 2021-03-02 LAB — COMPREHENSIVE METABOLIC PANEL
ALT: 16 U/L (ref 0–44)
AST: 10 U/L — ABNORMAL LOW (ref 15–41)
Albumin: 3.1 g/dL — ABNORMAL LOW (ref 3.5–5.0)
Alkaline Phosphatase: 104 U/L (ref 38–126)
Anion gap: 9 (ref 5–15)
BUN: 12 mg/dL (ref 6–20)
CO2: 22 mmol/L (ref 22–32)
Calcium: 9.1 mg/dL (ref 8.9–10.3)
Chloride: 108 mmol/L (ref 98–111)
Creatinine, Ser: 0.78 mg/dL (ref 0.44–1.00)
GFR, Estimated: >60 mL/min (ref >60)
Glucose, Bld: 107 mg/dL — ABNORMAL HIGH (ref 70–99)
Potassium: 4.2 mmol/L (ref 3.5–5.1)
Sodium: 139 mmol/L (ref 135–145)
Total Bilirubin: 0.1 mg/dL — ABNORMAL LOW (ref 0.3–1.2)
Total Protein: 6.8 g/dL (ref 6.5–8.1)

## 2021-03-02 LAB — SURGICAL PCR SCREEN
MRSA, PCR: NEGATIVE
Staphylococcus aureus: NEGATIVE

## 2021-03-02 MED ORDER — MUPIROCIN 2 % EX OINT
1.0000 "application " | TOPICAL_OINTMENT | Freq: Two times a day (BID) | CUTANEOUS | Status: AC
  Administered 2021-03-02 – 2021-03-07 (×10): 1 via NASAL
  Filled 2021-03-02 (×6): qty 22

## 2021-03-02 NOTE — Progress Notes (Signed)
Subjective: The patient is alert and pleasant.  She is in no apparent distress.  Objective: Vital signs in last 24 hours: Temp:  [98.1 F (36.7 C)-99 F (37.2 C)] 98.1 F (36.7 C) (02/11 0552) Pulse Rate:  [57-101] 76 (02/11 0900) Resp:  [11-23] 19 (02/11 0900) BP: (109-142)/(70-97) 127/78 (02/11 0900) SpO2:  [93 %-98 %] 95 % (02/11 0900) Weight:  [103.9 kg] 103.9 kg (02/10 1529) Estimated body mass index is 39.31 kg/m as calculated from the following:   Height as of this encounter: 5\' 4"  (1.626 m).   Weight as of this encounter: 103.9 kg.   Intake/Output from previous day: No intake/output data recorded. Intake/Output this shift: No intake/output data recorded.  Physical exam the patient is alert and oriented x3.  Her speech is normal.  Her strength is normal.  Her pupils are equal.  I reviewed the patient's brain MRI performed yesterday.  It demonstrates significant enlargement of her right temporal lesion with mass effect/shift.  Lab Results: Recent Labs    03/01/21 1559 03/01/21 1619  WBC  --  10.4  HGB 13.6 13.3  HCT 40.0 40.4  PLT  --  345   BMET Recent Labs    03/01/21 1559 03/01/21 1619  NA 140 139  K 3.5 3.4*  CL 105 106  CO2  --  23  GLUCOSE 117* 113*  BUN 12 13  CREATININE 0.80 0.89  CALCIUM  --  8.9    Studies/Results: CT HEAD WO CONTRAST  Result Date: 03/01/2021 CLINICAL DATA:  Neuro deficit EXAM: CT HEAD WITHOUT CONTRAST TECHNIQUE: Contiguous axial images were obtained from the base of the skull through the vertex without intravenous contrast. RADIATION DOSE REDUCTION: This exam was performed according to the departmental dose-optimization program which includes automated exposure control, adjustment of the mA and/or kV according to patient size and/or use of iterative reconstruction technique. COMPARISON:  None. FINDINGS: Brain: There is interval development of large area of low-density in the right temporal and parietal lobes. There is  extrinsic compression of right lateral ventricle. There is a proximally 10 mm shift of midline structures to the left. There are no signs of bleeding within the cranium. Vascular: Unremarkable. Skull: No fracture is seen. Sinuses/Orbits: Unremarkable. Other: Small lipoma is seen in the anterior falx. IMPRESSION: There is new large area of low attenuation in the right temporal and parietal lobes. There is extrinsic compression of right lateral ventricle approximately 10 mm shift of midline structures to the left. Findings suggest possible neoplastic process with surrounding edema and significant mass effect. Follow-up contrast enhanced CT or MRI as warranted should be considered. Imaging findings were relayed to patient's provider Dr. Reather Converse by telephone call. Electronically Signed   By: Elmer Picker M.D.   On: 03/01/2021 16:49   MR Brain W and Wo Contrast  Result Date: 03/01/2021 CLINICAL DATA:  Brain/CNS neoplasm, staging. Worsening headaches and mental status changes. Known right temporal mass. EXAM: MRI HEAD WITHOUT AND WITH CONTRAST TECHNIQUE: Multiplanar, multiecho pulse sequences of the brain and surrounding structures were obtained without and with intravenous contrast. CONTRAST:  49mL GADAVIST GADOBUTROL 1 MMOL/ML IV SOLN COMPARISON:  Head CT 03/01/2021 and MRI 01/15/2021 FINDINGS: Some sequences are mildly to moderately motion degraded. Brain: As seen on today's earlier head CT, a right temporal mass has markedly enlarged since the 01/15/2021 MRI. The mass now measures 6.0 x 4.1 x 5.3 cm and demonstrates thick, irregular peripheral enhancement with central necrosis. There is moderate surrounding edema, and there is  prominent regional mass effect including on the basal ganglia, right lateral ventricle, and midbrain with 1.1 cm of leftward midline shift, unchanged from today's earlier CT. Mild dilatation of the left lateral ventricle is consistent with trapping, and there is a small amount of  transependymal CSF flow along the occipital horn. No second enhancing brain lesion is identified. No acute infarct or extra-axial fluid collection is evident. Vascular: Major intracranial vascular flow voids are preserved. Anterior displacement of the right MCA by the mass. Skull and upper cervical spine: Unremarkable bone marrow signal. Sinuses/Orbits: Unremarkable orbits. Paranasal sinuses and mastoid air cells are clear. Other: None. IMPRESSION: 1. Marked enlargement of the right temporal mass since 01/15/2021 with moderate edema and prominent mass effect as above. 2. Trapping of the left lateral ventricle. Electronically Signed   By: Logan Bores M.D.   On: 03/01/2021 21:36    Assessment/Plan: Right brain tumor: I have discussed the situation again with the patient and now with her mother via the telephone.  We discussed the various treatment options including doing nothing, biopsy, and craniotomy for resection of the lesion.  I have recommended the latter.  I have again described the surgery.  We discussed the risks including risk of anesthesia, hemorrhage, infection, seizures, incomplete tumor resection, recurrent tumor growth, stroke, medical risk, etc.  I have answered all their questions.  She wants to proceed with surgery as planned on Monday.  We are awaiting a hospital bed.  LOS: 1 day     [name] 03/02/2021, 9:55 AM     Patient ID: Marry Guan, female   DOB: 09/26/1965, 56 y.o.   MRN: [id number]

## 2021-03-02 NOTE — Progress Notes (Signed)
Pt arrived to 4NP06 from ED. Pt a&ox4, vitals WNL, c/o slight HA. Pt's husband at bedside. Full assessment charted at this time.   Justice Rocher, RN

## 2021-03-03 LAB — BASIC METABOLIC PANEL
Anion gap: 10 (ref 5–15)
BUN: 13 mg/dL (ref 6–20)
CO2: 21 mmol/L — ABNORMAL LOW (ref 22–32)
Calcium: 8.8 mg/dL — ABNORMAL LOW (ref 8.9–10.3)
Chloride: 105 mmol/L (ref 98–111)
Creatinine, Ser: 0.82 mg/dL (ref 0.44–1.00)
GFR, Estimated: >60 mL/min (ref >60)
Glucose, Bld: 142 mg/dL — ABNORMAL HIGH (ref 70–99)
Potassium: 4 mmol/L (ref 3.5–5.1)
Sodium: 136 mmol/L (ref 135–145)

## 2021-03-03 LAB — MAGNESIUM: Magnesium: 2.1 mg/dL (ref 1.7–2.4)
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
## 2021-03-04 ENCOUNTER — Inpatient Hospital Stay (HOSPITAL_COMMUNITY): Payer: No Typology Code available for payment source | Admitting: Anesthesiology

## 2021-03-04 ENCOUNTER — Encounter (HOSPITAL_COMMUNITY)
Admission: EM | Disposition: A | Discharge status: Home / Self Care | Payer: Self-pay | Source: Home / Self Care | Attending: Neurosurgery

## 2021-03-04 ENCOUNTER — Encounter (HOSPITAL_COMMUNITY): Payer: Self-pay | Admitting: Neurosurgery

## 2021-03-04 ENCOUNTER — Other Ambulatory Visit: Payer: Self-pay

## 2021-03-04 DIAGNOSIS — D496 Neoplasm of unspecified behavior of brain: Secondary | ICD-10-CM

## 2021-03-04 HISTORY — PX: CRANIOTOMY: SHX93

## 2021-03-04 LAB — BASIC METABOLIC PANEL
Anion gap: 10 (ref 5–15)
BUN: 15 mg/dL (ref 6–20)
CO2: 24 mmol/L (ref 22–32)
Calcium: 9.2 mg/dL (ref 8.9–10.3)
Chloride: 105 mmol/L (ref 98–111)
Creatinine, Ser: 0.82 mg/dL (ref 0.44–1.00)
GFR, Estimated: >60 mL/min (ref >60)
Glucose, Bld: 118 mg/dL — ABNORMAL HIGH (ref 70–99)
Potassium: 4.1 mmol/L (ref 3.5–5.1)
Sodium: 139 mmol/L (ref 135–145)

## 2021-03-04 LAB — POCT I-STAT, CHEM 8
BUN: 15 mg/dL (ref 6–20)
Calcium, Ion: 1.21 mmol/L (ref 1.15–1.40)
Chloride: 109 mmol/L (ref 98–111)
Creatinine, Ser: 0.6 mg/dL (ref 0.44–1.00)
Glucose, Bld: 131 mg/dL — ABNORMAL HIGH (ref 70–99)
HCT: 33 % — ABNORMAL LOW (ref 36.0–46.0)
Hemoglobin: 11.2 g/dL — ABNORMAL LOW (ref 12.0–15.0)
Potassium: 3.8 mmol/L (ref 3.5–5.1)
Sodium: 141 mmol/L (ref 135–145)
TCO2: 23 mmol/L (ref 22–32)

## 2021-03-04 LAB — ABO/RH: ABO/RH(D): A NEG

## 2021-03-04 LAB — PREPARE RBC (CROSSMATCH)

## 2021-03-04 LAB — MRSA NEXT GEN BY PCR, NASAL: MRSA by PCR Next Gen: NOT DETECTED

## 2021-03-04 SURGERY — CRANIOTOMY TUMOR EXCISION
Anesthesia: General | Site: Head | Laterality: Right

## 2021-03-04 MED ORDER — ACETAMINOPHEN 325 MG PO TABS
650.0000 mg | ORAL_TABLET | ORAL | Status: DC | PRN
  Administered 2021-03-06: 650 mg via ORAL
  Filled 2021-03-04: qty 2

## 2021-03-04 MED ORDER — THROMBIN 20000 UNITS EX SOLR: CUTANEOUS | Status: DC | PRN

## 2021-03-04 MED ORDER — APREPITANT 40 MG PO CAPS
40.0000 mg | ORAL_CAPSULE | Freq: Once | ORAL | Status: AC
  Administered 2021-03-04: 40 mg via ORAL
  Filled 2021-03-04: qty 1

## 2021-03-04 MED ORDER — CHLORHEXIDINE GLUCONATE 0.12 % MT SOLN
15.0000 mL | Freq: Once | OROMUCOSAL | Status: AC
  Administered 2021-03-04: 15 mL via OROMUCOSAL

## 2021-03-04 MED ORDER — BACITRACIN ZINC 500 UNIT/GM EX OINT
TOPICAL_OINTMENT | CUTANEOUS | Status: AC
  Filled 2021-03-04: qty 28.35

## 2021-03-04 MED ORDER — FENTANYL CITRATE (PF) 100 MCG/2ML IJ SOLN
INTRAMUSCULAR | Status: DC | PRN
  Administered 2021-03-04: 150 ug via INTRAVENOUS
  Administered 2021-03-04: 100 ug via INTRAVENOUS

## 2021-03-04 MED ORDER — PROPOFOL 500 MG/50ML IV EMUL
INTRAVENOUS | Status: DC | PRN
  Administered 2021-03-04: 50 ug/kg/min via INTRAVENOUS

## 2021-03-04 MED ORDER — SODIUM CHLORIDE 0.9 % IV SOLN: INTRAVENOUS | Status: DC | PRN

## 2021-03-04 MED ORDER — LIDOCAINE 2% (20 MG/ML) 5 ML SYRINGE
INTRAMUSCULAR | Status: DC | PRN
Start: 2021-03-04 — End: 2021-03-04
  Administered 2021-03-04: 60 mg via INTRAVENOUS

## 2021-03-04 MED ORDER — HYDRALAZINE HCL 20 MG/ML IJ SOLN
10.0000 mg | INTRAMUSCULAR | Status: DC | PRN
  Administered 2021-03-04 – 2021-03-05 (×2): 10 mg via INTRAVENOUS
  Filled 2021-03-04 (×2): qty 1

## 2021-03-04 MED ORDER — HEMOSTATIC AGENTS (NO CHARGE) OPTIME
TOPICAL | Status: DC | PRN
Start: 2021-03-04 — End: 2021-03-04
  Administered 2021-03-04: 1 via TOPICAL

## 2021-03-04 MED ORDER — CLEVIDIPINE BUTYRATE 0.5 MG/ML IV EMUL
INTRAVENOUS | Status: DC | PRN
Start: 2021-03-04 — End: 2021-03-04
  Administered 2021-03-04: 2 mg/h via INTRAVENOUS

## 2021-03-04 MED ORDER — CLEVIDIPINE BUTYRATE 0.5 MG/ML IV EMUL: 0.0000 mg/h | INTRAVENOUS | Status: DC

## 2021-03-04 MED ORDER — CLEVIDIPINE BUTYRATE 0.5 MG/ML IV EMUL
INTRAVENOUS | Status: AC
  Filled 2021-03-04: qty 50

## 2021-03-04 MED ORDER — ONDANSETRON HCL 4 MG PO TABS: 4.0000 mg | ORAL_TABLET | ORAL | Status: DC | PRN

## 2021-03-04 MED ORDER — FENTANYL CITRATE (PF) 250 MCG/5ML IJ SOLN
INTRAMUSCULAR | Status: AC
  Filled 2021-03-04: qty 5

## 2021-03-04 MED ORDER — 0.9 % SODIUM CHLORIDE (POUR BTL) OPTIME
TOPICAL | Status: DC | PRN
  Administered 2021-03-04: 3000 mL

## 2021-03-04 MED ORDER — FENTANYL CITRATE (PF) 100 MCG/2ML IJ SOLN
25.0000 ug | INTRAMUSCULAR | Status: DC | PRN
  Administered 2021-03-04: 50 ug via INTRAVENOUS

## 2021-03-04 MED ORDER — ROCURONIUM BROMIDE 10 MG/ML (PF) SYRINGE
PREFILLED_SYRINGE | INTRAVENOUS | Status: DC | PRN
Start: 2021-03-04 — End: 2021-03-04
  Administered 2021-03-04: 40 mg via INTRAVENOUS
  Administered 2021-03-04: 20 mg via INTRAVENOUS
  Administered 2021-03-04: 60 mg via INTRAVENOUS

## 2021-03-04 MED ORDER — SUGAMMADEX SODIUM 200 MG/2ML IV SOLN
INTRAVENOUS | Status: DC | PRN
Start: 2021-03-04 — End: 2021-03-04
  Administered 2021-03-04: 200 mg via INTRAVENOUS

## 2021-03-04 MED ORDER — CHLORHEXIDINE GLUCONATE CLOTH 2 % EX PADS
6.0000 | MEDICATED_PAD | Freq: Every day | CUTANEOUS | Status: DC
  Administered 2021-03-05 – 2021-03-07 (×2): 6 via TOPICAL

## 2021-03-04 MED ORDER — ACETAMINOPHEN 500 MG PO TABS
1000.0000 mg | ORAL_TABLET | Freq: Once | ORAL | Status: AC
  Administered 2021-03-04: 1000 mg via ORAL
  Filled 2021-03-04: qty 2

## 2021-03-04 MED ORDER — GLYCOPYRROLATE PF 0.2 MG/ML IJ SOSY
PREFILLED_SYRINGE | INTRAMUSCULAR | Status: DC | PRN
Start: 2021-03-04 — End: 2021-03-04
  Administered 2021-03-04: .2 mg via INTRAVENOUS

## 2021-03-04 MED ORDER — FENTANYL CITRATE (PF) 100 MCG/2ML IJ SOLN
INTRAMUSCULAR | Status: AC
  Filled 2021-03-04: qty 2

## 2021-03-04 MED ORDER — DEXAMETHASONE SODIUM PHOSPHATE 10 MG/ML IJ SOLN
6.0000 mg | Freq: Four times a day (QID) | INTRAMUSCULAR | Status: AC
  Administered 2021-03-04 – 2021-03-05 (×4): 6 mg via INTRAVENOUS
  Filled 2021-03-04 (×4): qty 1

## 2021-03-04 MED ORDER — LEVETIRACETAM IN NACL 500 MG/100ML IV SOLN
500.0000 mg | INTRAVENOUS | Status: AC
  Administered 2021-03-04: 500 mg via INTRAVENOUS
  Filled 2021-03-04: qty 100

## 2021-03-04 MED ORDER — MORPHINE SULFATE (PF) 2 MG/ML IV SOLN
2.0000 mg | INTRAVENOUS | Status: DC | PRN
  Administered 2021-03-05 – 2021-03-06 (×5): 2 mg via INTRAVENOUS
  Administered 2021-03-06 – 2021-03-07 (×3): 4 mg via INTRAVENOUS
  Filled 2021-03-04 (×3): qty 1
  Filled 2021-03-04: qty 2
  Filled 2021-03-04: qty 1
  Filled 2021-03-04 (×2): qty 2
  Filled 2021-03-04: qty 1

## 2021-03-04 MED ORDER — ONDANSETRON HCL 4 MG/2ML IJ SOLN
4.0000 mg | INTRAMUSCULAR | Status: DC | PRN
  Administered 2021-03-05 – 2021-03-07 (×3): 4 mg via INTRAVENOUS
  Filled 2021-03-04 (×4): qty 2

## 2021-03-04 MED ORDER — PROMETHAZINE HCL 25 MG PO TABS
12.5000 mg | ORAL_TABLET | ORAL | Status: DC | PRN
  Administered 2021-03-05: 25 mg via ORAL
  Filled 2021-03-04: qty 1

## 2021-03-04 MED ORDER — PROPOFOL 10 MG/ML IV BOLUS
INTRAVENOUS | Status: AC
  Filled 2021-03-04: qty 20

## 2021-03-04 MED ORDER — BUPIVACAINE-EPINEPHRINE 0.5% -1:200000 IJ SOLN
INTRAMUSCULAR | Status: AC
  Filled 2021-03-04: qty 1

## 2021-03-04 MED ORDER — POTASSIUM CHLORIDE IN NACL 20-0.9 MEQ/L-% IV SOLN
INTRAVENOUS | Status: DC
  Filled 2021-03-04: qty 1000

## 2021-03-04 MED ORDER — BUPIVACAINE-EPINEPHRINE 0.5% -1:200000 IJ SOLN
INTRAMUSCULAR | Status: DC | PRN
  Administered 2021-03-04: 10 mL

## 2021-03-04 MED ORDER — OXYCODONE HCL 5 MG PO TABS: 5.0000 mg | ORAL_TABLET | Freq: Once | ORAL | Status: DC | PRN

## 2021-03-04 MED ORDER — THROMBIN 20000 UNITS EX SOLR
CUTANEOUS | Status: AC
  Filled 2021-03-04: qty 20000

## 2021-03-04 MED ORDER — CEFAZOLIN SODIUM-DEXTROSE 2-4 GM/100ML-% IV SOLN
INTRAVENOUS | Status: AC
  Filled 2021-03-04: qty 100

## 2021-03-04 MED ORDER — THROMBIN 5000 UNITS EX SOLR: OROMUCOSAL | Status: DC | PRN

## 2021-03-04 MED ORDER — ORAL CARE MOUTH RINSE: 15.0000 mL | Freq: Once | OROMUCOSAL | Status: AC

## 2021-03-04 MED ORDER — MIDAZOLAM HCL 2 MG/2ML IJ SOLN
INTRAMUSCULAR | Status: AC
  Filled 2021-03-04: qty 2

## 2021-03-04 MED ORDER — DEXAMETHASONE SODIUM PHOSPHATE 4 MG/ML IJ SOLN
4.0000 mg | Freq: Three times a day (TID) | INTRAMUSCULAR | Status: DC
  Administered 2021-03-07 (×3): 4 mg via INTRAVENOUS
  Filled 2021-03-04 (×3): qty 1

## 2021-03-04 MED ORDER — LEVETIRACETAM IN NACL 500 MG/100ML IV SOLN
500.0000 mg | Freq: Two times a day (BID) | INTRAVENOUS | Status: DC
  Administered 2021-03-05 – 2021-03-06 (×3): 500 mg via INTRAVENOUS
  Filled 2021-03-04 (×3): qty 100

## 2021-03-04 MED ORDER — SODIUM CHLORIDE 0.9 % IV SOLN: INTRAVENOUS | Status: DC

## 2021-03-04 MED ORDER — DEXAMETHASONE SODIUM PHOSPHATE 4 MG/ML IJ SOLN
4.0000 mg | Freq: Four times a day (QID) | INTRAMUSCULAR | Status: AC
  Administered 2021-03-05 – 2021-03-06 (×4): 4 mg via INTRAVENOUS
  Filled 2021-03-04 (×4): qty 1

## 2021-03-04 MED ORDER — SODIUM CHLORIDE 0.9 % IV SOLN
0.1500 ug/kg/min | INTRAVENOUS | Status: AC
  Administered 2021-03-04: .05 ug/kg/min via INTRAVENOUS
  Filled 2021-03-04: qty 2000

## 2021-03-04 MED ORDER — PROPOFOL 10 MG/ML IV BOLUS
INTRAVENOUS | Status: DC | PRN
Start: 2021-03-04 — End: 2021-03-04
  Administered 2021-03-04: 150 mg via INTRAVENOUS

## 2021-03-04 MED ORDER — ACETAMINOPHEN 650 MG RE SUPP: 650.0000 mg | RECTAL | Status: DC | PRN

## 2021-03-04 MED ORDER — DOCUSATE SODIUM 100 MG PO CAPS
100.0000 mg | ORAL_CAPSULE | Freq: Two times a day (BID) | ORAL | Status: DC
  Administered 2021-03-04 – 2021-03-07 (×6): 100 mg via ORAL
  Filled 2021-03-04 (×6): qty 1

## 2021-03-04 MED ORDER — OXYCODONE-ACETAMINOPHEN 5-325 MG PO TABS
1.0000 | ORAL_TABLET | ORAL | Status: DC | PRN
  Administered 2021-03-05 – 2021-03-07 (×6): 2 via ORAL
  Filled 2021-03-04 (×6): qty 2

## 2021-03-04 MED ORDER — PHENYLEPHRINE HCL (PRESSORS) 10 MG/ML IV SOLN
INTRAVENOUS | Status: DC | PRN
Start: 2021-03-04 — End: 2021-03-04
  Administered 2021-03-04 (×2): 40 ug via INTRAVENOUS

## 2021-03-04 MED ORDER — THROMBIN 5000 UNITS EX SOLR
CUTANEOUS | Status: AC
  Filled 2021-03-04: qty 5000

## 2021-03-04 MED ORDER — BACITRACIN ZINC 500 UNIT/GM EX OINT
TOPICAL_OINTMENT | CUTANEOUS | Status: DC | PRN
  Administered 2021-03-04: 1 via TOPICAL

## 2021-03-04 MED ORDER — MIDAZOLAM HCL 5 MG/5ML IJ SOLN
INTRAMUSCULAR | Status: DC | PRN
Start: 2021-03-04 — End: 2021-03-04
  Administered 2021-03-04: 2 mg via INTRAVENOUS

## 2021-03-04 MED ORDER — CEFAZOLIN SODIUM-DEXTROSE 2-4 GM/100ML-% IV SOLN
2.0000 g | Freq: Three times a day (TID) | INTRAVENOUS | Status: AC
  Administered 2021-03-04 – 2021-03-05 (×2): 2 g via INTRAVENOUS
  Filled 2021-03-04 (×2): qty 100

## 2021-03-04 MED ORDER — OXYCODONE HCL 5 MG/5ML PO SOLN: 5.0000 mg | Freq: Once | ORAL | Status: DC | PRN

## 2021-03-04 MED ORDER — PANTOPRAZOLE SODIUM 40 MG IV SOLR
40.0000 mg | Freq: Every day | INTRAVENOUS | Status: DC
  Administered 2021-03-04 – 2021-03-05 (×2): 40 mg via INTRAVENOUS
  Filled 2021-03-04 (×2): qty 10

## 2021-03-04 MED ORDER — PHENYLEPHRINE HCL-NACL 20-0.9 MG/250ML-% IV SOLN
INTRAVENOUS | Status: DC | PRN
  Administered 2021-03-04: 10 ug/min via INTRAVENOUS

## 2021-03-04 MED ORDER — LABETALOL HCL 5 MG/ML IV SOLN: 10.0000 mg | INTRAVENOUS | Status: DC | PRN

## 2021-03-04 SURGICAL SUPPLY — 67 items
BAG COUNTER SPONGE SURGICOUNT (BAG) ×3 IMPLANT
BAG SPNG CNTER NS LX DISP (BAG) ×2
BAND INSRT 18 STRL LF DISP RB (MISCELLANEOUS) ×2
BAND RUBBER #18 3X1/16 STRL (MISCELLANEOUS) ×4 IMPLANT
BIT DRILL WIRE PASS 1.3MM (BIT) IMPLANT
BLADE ULTRA TIP 2M (BLADE) IMPLANT
BUR PRECISION FLUTE 6.0 (BURR) ×2 IMPLANT
BUR SPIRAL ROUTER 2.3 (BUR) ×1 IMPLANT
CANISTER SUCT 3000ML PPV (MISCELLANEOUS) ×4 IMPLANT
CARTRIDGE OIL MAESTRO DRILL (MISCELLANEOUS) ×1 IMPLANT
CNTNR URN SCR LID CUP LEK RST (MISCELLANEOUS) ×1 IMPLANT
CONT SPEC 4OZ STRL OR WHT (MISCELLANEOUS) ×2
DIFFUSER DRILL AIR PNEUMATIC (MISCELLANEOUS) ×2 IMPLANT
DRAPE MICROSCOPE LEICA (MISCELLANEOUS) ×2 IMPLANT
DRAPE NEUROLOGICAL W/INCISE (DRAPES) ×2 IMPLANT
DRAPE WARM FLUID 44X44 (DRAPES) ×2 IMPLANT
DRILL WIRE PASS 1.3MM (BIT)
ELECT REM PT RETURN 9FT ADLT (ELECTROSURGICAL) ×2
ELECTRODE REM PT RTRN 9FT ADLT (ELECTROSURGICAL) ×1 IMPLANT
FORCEPS BIPOLAR SPETZLER 8 1.0 (NEUROSURGERY SUPPLIES) ×1 IMPLANT
GAUZE 4X4 16PLY ~~LOC~~+RFID DBL (SPONGE) ×2 IMPLANT
GAUZE SPONGE 4X4 12PLY STRL LF (GAUZE/BANDAGES/DRESSINGS) ×1 IMPLANT
GLOVE SURG ENC MOIS LTX SZ8 (GLOVE) ×3 IMPLANT
GLOVE SURG ENC MOIS LTX SZ8.5 (GLOVE) ×3 IMPLANT
GLOVE SURG POLYISO LF SZ6.5 (GLOVE) ×1 IMPLANT
GOWN STRL REUS W/ TWL LRG LVL3 (GOWN DISPOSABLE) IMPLANT
GOWN STRL REUS W/ TWL XL LVL3 (GOWN DISPOSABLE) ×1 IMPLANT
GOWN STRL REUS W/TWL LRG LVL3 (GOWN DISPOSABLE) ×6
GOWN STRL REUS W/TWL XL LVL3 (GOWN DISPOSABLE) ×4
HEMOSTAT SURGICEL 2X14 (HEMOSTASIS) ×2 IMPLANT
IV NS 1000ML (IV SOLUTION) ×2
IV NS 1000ML BAXH (IV SOLUTION) IMPLANT
KIT BASIN OR (CUSTOM PROCEDURE TRAY) ×2 IMPLANT
KIT TURNOVER KIT B (KITS) ×2 IMPLANT
MARKER SKIN DUAL TIP RULER LAB (MISCELLANEOUS) ×1 IMPLANT
NEEDLE HYPO 22GX1.5 SAFETY (NEEDLE) ×2 IMPLANT
NS IRRIG 1000ML POUR BTL (IV SOLUTION) ×4 IMPLANT
OIL CARTRIDGE MAESTRO DRILL (MISCELLANEOUS) ×2
PACK CRANIOTOMY CUSTOM (CUSTOM PROCEDURE TRAY) ×2 IMPLANT
PATTIES SURGICAL .25X.25 (GAUZE/BANDAGES/DRESSINGS) IMPLANT
PATTIES SURGICAL .5 X.5 (GAUZE/BANDAGES/DRESSINGS) IMPLANT
PATTIES SURGICAL .5 X3 (DISPOSABLE) IMPLANT
PATTIES SURGICAL 1X1 (DISPOSABLE) IMPLANT
PIN MAYFIELD SKULL DISP (PIN) ×1 IMPLANT
PLATE CRANIAL 12 2H RIGID UNI (Plate) ×3 IMPLANT
SCREW UNIII AXS SD 1.5X4 (Screw) ×6 IMPLANT
SET CARTRIDGE AND TUBING (SET/KITS/TRAYS/PACK) ×1 IMPLANT
SPONGE NEURO XRAY DETECT 1X3 (DISPOSABLE) ×2 IMPLANT
SPONGE SURGIFOAM ABS GEL 100 (HEMOSTASIS) ×2 IMPLANT
STAPLER SKIN PROX WIDE 3.9 (STAPLE) ×2 IMPLANT
SUT ETHILON 3 0 FSL (SUTURE) IMPLANT
SUT ETHILON 3 0 PS 1 (SUTURE) IMPLANT
SUT NURALON 4 0 TR CR/8 (SUTURE) ×4 IMPLANT
SUT PROLENE 6 0 BV (SUTURE) IMPLANT
SUT SILK 0 TIES 10X30 (SUTURE) IMPLANT
SUT VIC AB 2-0 CP2 18 (SUTURE) ×4 IMPLANT
SUT VIC AB 3-0 FS2 27 (SUTURE) IMPLANT
SUT VICRYL 4-0 PS2 18IN ABS (SUTURE) IMPLANT
TAPE CLOTH SURG 4X10 WHT LF (GAUZE/BANDAGES/DRESSINGS) ×1 IMPLANT
TIP STANDARD 36KHZ (INSTRUMENTS) ×2
TIP STD 36KHZ (INSTRUMENTS) IMPLANT
TOWEL GREEN STERILE (TOWEL DISPOSABLE) ×2 IMPLANT
TOWEL GREEN STERILE FF (TOWEL DISPOSABLE) ×2 IMPLANT
TRAY FOLEY MTR SLVR 16FR STAT (SET/KITS/TRAYS/PACK) ×1 IMPLANT
TUBE CONNECTING 12X1/4 (SUCTIONS) ×1 IMPLANT
WATER STERILE IRR 1000ML POUR (IV SOLUTION) ×2 IMPLANT
WRENCH TORQUE 36KHZ (INSTRUMENTS) ×1 IMPLANT

## 2021-03-04 NOTE — Anesthesia Postprocedure Evaluation (Signed)
Anesthesia Post Note  Patient: Nozomi Mettler  Procedure(s) Performed: CRANIOTOMY TUMOR RESECTION W/ BRAIN LAB (Right: Head)     Patient location during evaluation: PACU Anesthesia Type: General Level of consciousness: awake and alert Pain management: pain level controlled Vital Signs Assessment: post-procedure vital signs reviewed and stable Respiratory status: spontaneous breathing, nonlabored ventilation and respiratory function stable Cardiovascular status: blood pressure returned to baseline and stable Postop Assessment: no apparent nausea or vomiting Anesthetic complications: no   No notable events documented.  Last Vitals:  Vitals:   03/04/21 2145 03/04/21 2200  BP:  (!) 145/87  Pulse: (!) 55 (!) 48  Resp: 10 18  Temp: (!) 36.1 C   SpO2: 90% 90%    Last Pain:  Vitals:   03/04/21 2145  TempSrc: Axillary  PainSc: 10-Worst pain ever                 Oney Tatlock,W. EDMOND

## 2021-03-04 NOTE — Anesthesia Procedure Notes (Signed)
Procedure Name: Intubation Date/Time: 03/04/2021 5:03 PM Performed by: Georgia Duff, CRNA Pre-anesthesia Checklist: Patient identified, Emergency Drugs available, Suction available and Patient being monitored Patient Re-evaluated:Patient Re-evaluated prior to induction Oxygen Delivery Method: Circle System Utilized Preoxygenation: Pre-oxygenation with 100% oxygen Induction Type: IV induction Ventilation: Mask ventilation without difficulty Laryngoscope Size: Miller and 3 Grade View: Grade I Tube type: Oral Tube size: 7.0 mm Number of attempts: 1 Airway Equipment and Method: Stylet and Oral airway Placement Confirmation: ETT inserted through vocal cords under direct vision, positive ETCO2 and breath sounds checked- equal and bilateral Secured at: 21 cm Tube secured with: Tape Dental Injury: Teeth and Oropharynx as per pre-operative assessment

## 2021-03-04 NOTE — Progress Notes (Signed)
°   03/04/21 0930  Clinical Encounter Type  Visited With Patient  Visit Type Initial;Other (Comment) (Advanced Directive)  Referral From Nurse  Consult/Referral To None   Chaplain responded to a request for an advanced directive.  Chaplain reviewed paperwork with patient and left forms with her. Patient intended to complete the paperwork today and would let me know when she was done so that we could complete the process.   [place] Resident  [facility] [phone]

## 2021-03-04 NOTE — Progress Notes (Signed)
°   03/04/21 1350  Clinical Encounter Type  Visited With Patient;Family  Visit Type Follow-up  Referral From Nurse  Consult/Referral To None   Chaplain supported patient as she completed the final steps of her advanced directive.  The original and a copy were provided to the patient and a scan was sent to [email or web address].   [place] Resident  [facility] [phone]

## 2021-03-04 NOTE — Transfer of Care (Signed)
Immediate Anesthesia Transfer of Care Note  Patient: Kelly [name]  Procedure(s) Performed: CRANIOTOMY TUMOR RESECTION W/ BRAIN LAB (Right: Head)  Patient Location: PACU  Anesthesia Type:General  Level of Consciousness: oriented, drowsy and responds to stimulation  Airway & Oxygen Therapy: Patient Spontanous Breathing and Patient connected to face mask oxygen  Post-op Assessment: Report given to RN, Post -op Vital signs reviewed and stable and Patient moving all extremities X 4  Post vital signs: Reviewed and stable  Last Vitals:  Vitals Value Taken Time  BP 115/64 03/04/21 2058  Temp    Pulse 89 03/04/21 2104  Resp 19 03/04/21 2104  SpO2 96 % 03/04/21 2104  Vitals shown include unvalidated device data.  Last Pain:  Vitals:   03/04/21 1511  TempSrc: Oral  PainSc: 5       Patients Stated Pain Goal: 2 (88/75/79 7282)  Complications: No notable events documented.

## 2021-03-04 NOTE — Progress Notes (Signed)
Subjective: The patient is alert and pleasant.  She is in no apparent distress.  Objective: Vital signs in last 24 hours: Temp:  [97.7 F (36.5 C)-98.7 F (37.1 C)] 97.7 F (36.5 C) (02/13 1511) Pulse Rate:  [50-64] 50 (02/13 1511) Resp:  [17-20] 20 (02/13 1511) BP: (113-187)/(63-93) 187/93 (02/13 1511) SpO2:  [94 %-97 %] 94 % (02/13 1511) Weight:  [95.5 kg] 95.5 kg (02/13 0500) Estimated body mass index is 36.14 kg/m as calculated from the following:   Height as of this encounter: 5\' 4"  (1.626 m).   Weight as of this encounter: 95.5 kg.   Intake/Output from previous day: 02/12 0701 - 02/13 0700 In: -  Out: 500 [Urine:500] Intake/Output this shift: Total I/O In: 1522.6 [I.V.:1522.6] Out: -   Physical exam the patient is alert and oriented.  She is moving all 4 extremities well.  Lab Results: No results for input(s): WBC, HGB, HCT, PLT in the last 72 hours. BMET Recent Labs    03/03/21 0421 03/04/21 0644  NA 136 139  K 4.0 4.1  CL 105 105  CO2 21* 24  GLUCOSE 142* 118*  BUN 13 15  CREATININE 0.82 0.82  CALCIUM 8.8* 9.2    Studies/Results: No results found.  Assessment/Plan: Right brain tumor: I have again discussed the situation with the patient.  We discussed the various treatment options.  I have described the surgical treatment option of a right craniotomy for resection of the tumor.  We have discussed the risk of surgery including risk of anesthesia, hemorrhage, infection, stroke, vision loss, incomplete tumor resection, recurrent tumor growth, seizures, medical risk, etc.  I have answered all her questions.  She has decided proceed with surgery.  LOS: 3 days     [name] 03/04/2021, 4:37 PM

## 2021-03-04 NOTE — Progress Notes (Signed)
Subjective: The patient is somnolent but arousable.    Objective: Vital signs in last 24 hours: Temp:  [97.7 F (36.5 C)-98.7 F (37.1 C)] 97.7 F (36.5 C) (02/13 1511) Pulse Rate:  [50-54] 50 (02/13 1511) Resp:  [17-20] 20 (02/13 1511) BP: (113-187)/(63-93) 187/93 (02/13 1511) SpO2:  [94 %-96 %] 94 % (02/13 1511) Weight:  [95.5 kg] 95.5 kg (02/13 0500) Estimated body mass index is 36.14 kg/m as calculated from the following:   Height as of this encounter: 5\' 4"  (1.626 m).   Weight as of this encounter: 95.5 kg.   Intake/Output from previous day: 02/12 0701 - 02/13 0700 In: -  Out: 500 [Urine:500] Intake/Output this shift: Total I/O In: 1200 [I.V.:1200] Out: 1090 [Urine:790; Blood:300]  Physical exam the patient is somnolent but arousable.  She is weak on the left.  She follows commands on the right.  Her pupils are equal.  Her dressing is clean dry.  Lab Results: No results for input(s): WBC, HGB, HCT, PLT in the last 72 hours. BMET Recent Labs    03/03/21 0421 03/04/21 0644  NA 136 139  K 4.0 4.1  CL 105 105  CO2 21* 24  GLUCOSE 142* 118*  BUN 13 15  CREATININE 0.82 0.82  CALCIUM 8.8* 9.2    Studies/Results: No results found.  Assessment/Plan: Status post tumor resection: The patient seems to be doing well I think her weakness will improve with time.  I have spoken to her family.  LOS: 3 days     [name] 03/04/2021, 8:58 PM

## 2021-03-04 NOTE — Anesthesia Preprocedure Evaluation (Addendum)
Anesthesia Evaluation  Patient identified by MRN, date of birth, ID band Patient awake    Reviewed: Allergy & Precautions, NPO status , Patient's Chart, lab work & pertinent test results  History of Anesthesia Complications Negative for: history of anesthetic complications  Airway Mallampati: II  TM Distance: >3 FB Neck ROM: Full    Dental  (+) Dental Advisory Given, Missing   Pulmonary former smoker,    Pulmonary exam normal        Cardiovascular hypertension, Pt. on medications Normal cardiovascular exam     Neuro/Psych PSYCHIATRIC DISORDERS Depression  Brain tumor     GI/Hepatic Neg liver ROS, hiatal hernia,   Endo/Other  Hypothyroidism  Obesity Pre-DM   Renal/GU negative Renal ROS     Musculoskeletal  (+) Arthritis , Osteoarthritis,    Abdominal   Peds  Hematology negative hematology ROS (+)   Anesthesia Other Findings   Reproductive/Obstetrics  s/p tubal ligation                             Anesthesia Physical Anesthesia Plan  ASA: 2  Anesthesia Plan: General   Post-op Pain Management: Tylenol PO (pre-op)   Induction: Intravenous  PONV Risk Score and Plan: 4 or greater and Treatment may vary due to age or medical condition, Ondansetron, Aprepitant, Dexamethasone and Midazolam  Airway Management Planned: Oral ETT  Additional Equipment: Arterial line  Intra-op Plan:   Post-operative Plan: Extubation in OR  Informed Consent: I have reviewed the patients History and Physical, chart, labs and discussed the procedure including the risks, benefits and alternatives for the proposed anesthesia with the patient or authorized representative who has indicated his/her understanding and acceptance.     Dental advisory given  Plan Discussed with: CRNA and Anesthesiologist  Anesthesia Plan Comments:        Anesthesia Quick Evaluation
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
## 2021-03-05 ENCOUNTER — Encounter (HOSPITAL_COMMUNITY): Payer: Self-pay | Admitting: Neurosurgery

## 2021-03-05 ENCOUNTER — Inpatient Hospital Stay (HOSPITAL_COMMUNITY): Payer: No Typology Code available for payment source

## 2021-03-05 LAB — TYPE AND SCREEN
ABO/RH(D): A NEG
Antibody Screen: NEGATIVE
Unit division: 0
Unit division: 0

## 2021-03-05 LAB — BASIC METABOLIC PANEL
Anion gap: 13 (ref 5–15)
BUN: 17 mg/dL (ref 6–20)
CO2: 19 mmol/L — ABNORMAL LOW (ref 22–32)
Calcium: 8.7 mg/dL — ABNORMAL LOW (ref 8.9–10.3)
Chloride: 106 mmol/L (ref 98–111)
Creatinine, Ser: 0.77 mg/dL (ref 0.44–1.00)
GFR, Estimated: >60 mL/min (ref >60)
Glucose, Bld: 155 mg/dL — ABNORMAL HIGH (ref 70–99)
Potassium: 4 mmol/L (ref 3.5–5.1)
Sodium: 138 mmol/L (ref 135–145)

## 2021-03-05 LAB — CBC
HCT: 37.5 % (ref 36.0–46.0)
Hemoglobin: 12.1 g/dL (ref 12.0–15.0)
MCH: 30.3 pg (ref 26.0–34.0)
MCHC: 32.3 g/dL (ref 30.0–36.0)
MCV: 94 fL (ref 80.0–100.0)
Platelets: 375 10*3/uL (ref 150–400)
RBC: 3.99 MIL/uL (ref 3.87–5.11)
RDW: 12.5 % (ref 11.5–15.5)
WBC: 17.3 10*3/uL — ABNORMAL HIGH (ref 4.0–10.5)
nRBC: 0 % (ref 0.0–0.2)

## 2021-03-05 LAB — BPAM RBC
Blood Product Expiration Date: 202302222359
Blood Product Expiration Date: 202302222359
Unit Type and Rh: 600
Unit Type and Rh: 600

## 2021-03-05 IMAGING — MR MR HEAD WO/W CM
9 of 14 series · 28 of 48 positions shown · IV contrast (Yes GAD)
Comparison: Brain MRI most recently [DATE]

CLINICAL DATA: Right temporal mass status post resection

EXAM:
MRI HEAD WITHOUT AND WITH CONTRAST
TECHNIQUE: Multiplanar, multiecho pulse sequences of the brain and surrounding
structures were obtained without and with intravenous contrast.
CONTRAST:  9mL GADAVIST GADOBUTROL 1 MMOL/ML IV SOLN

[Series 2: DWI · axial · 3.0mm · 0.94mm/px · z∈[-72,+75]mm · 6 of 100 slices shown (1 of 2)]
[im 1/100]
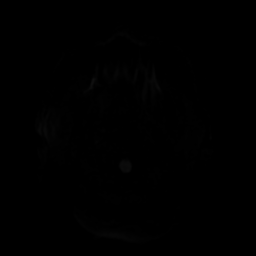
[im 20/100]
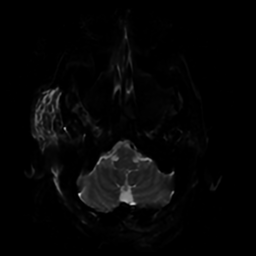
[im 40/100]
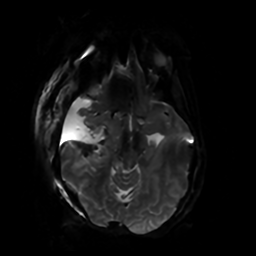
[im 60/100]
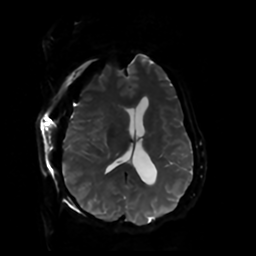
[im 80/100]
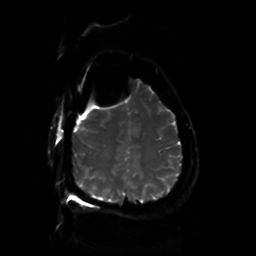
[im 100/100]
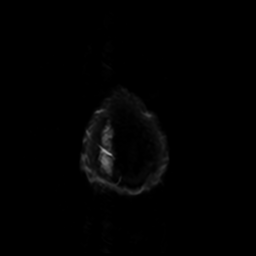

[Series 4: T2 · axial · 5.0mm · 0.23mm/px · 1 of 26 slices shown]
[im 1/26]
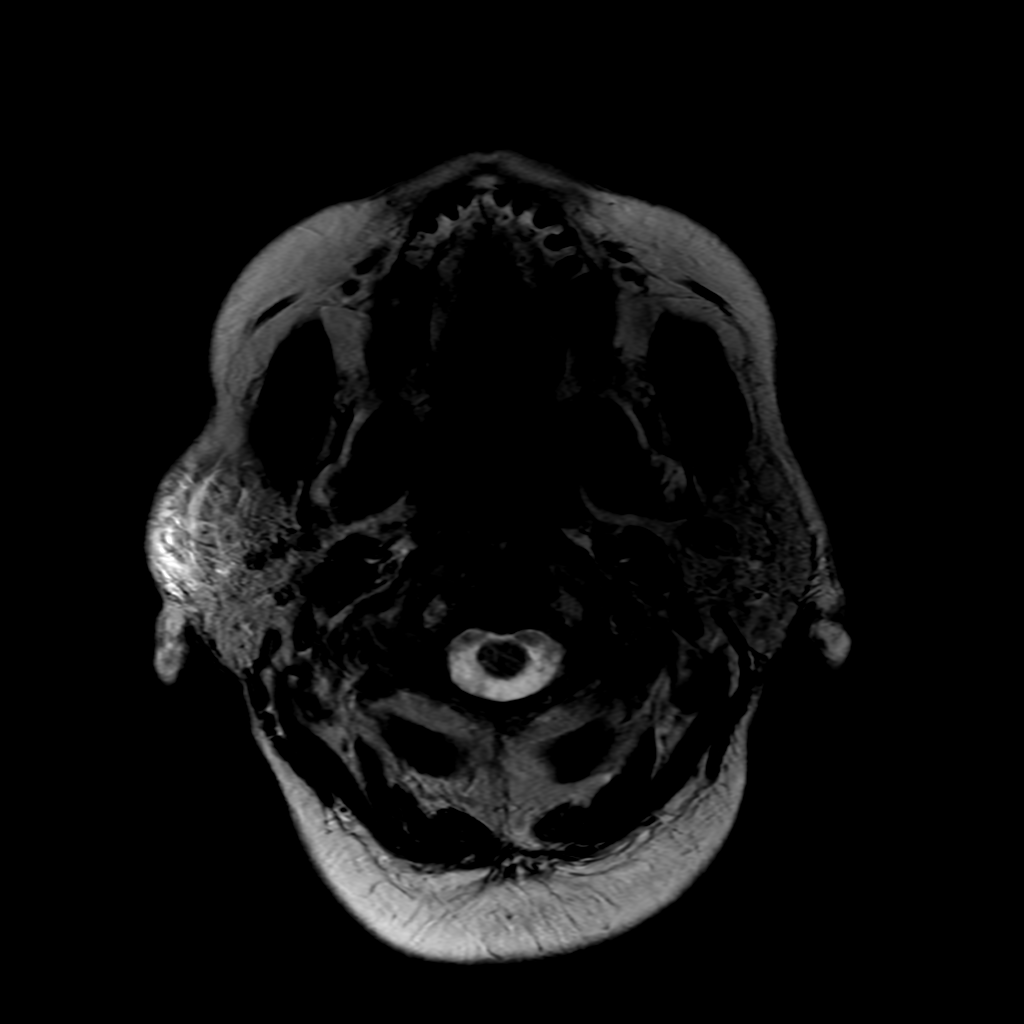

[Series 5: FLAIR · axial · 4.0mm · 0.45mm/px · z∈[-74,+76]mm · 3 of 35 slices shown (1 of 2)]
[im 1/35]
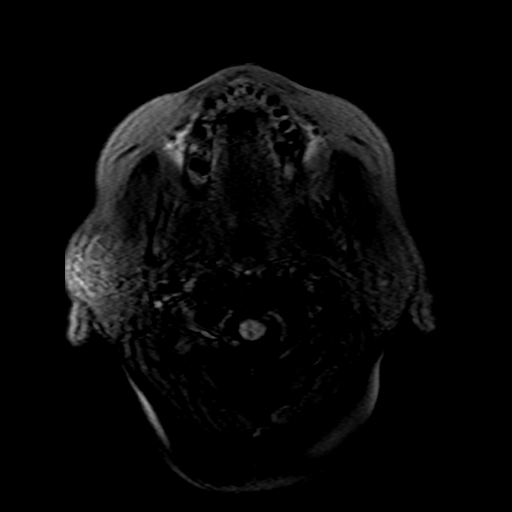
[im 18/35]
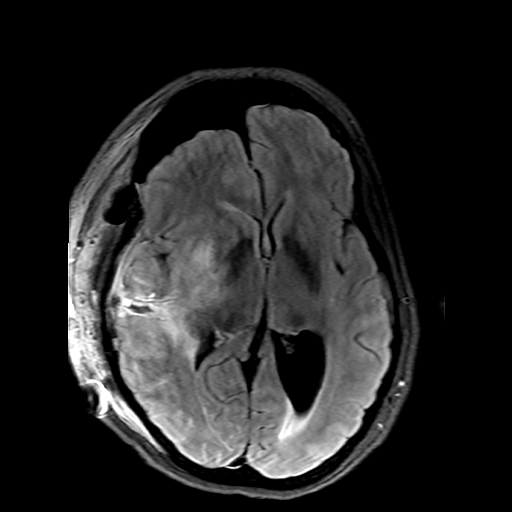
[im 35/35]
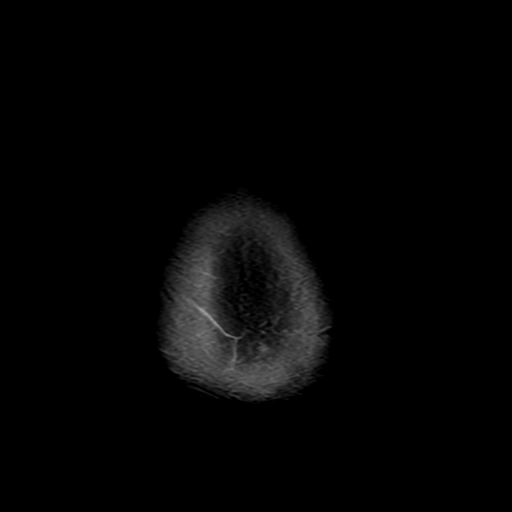

[Series 8: DWI · coronal · 4.0mm · 0.94mm/px · 5 of 71 slices shown (2 of 2)]
[im 1/71]
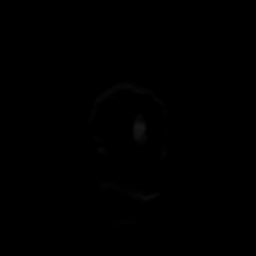
[im 18/71]
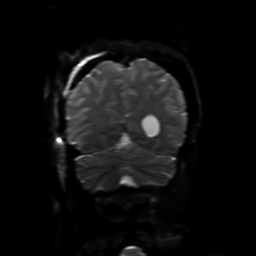
[im 36/71]
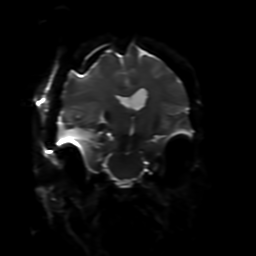
[im 53/71]
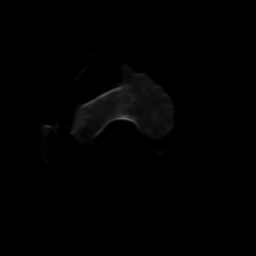
[im 71/71]
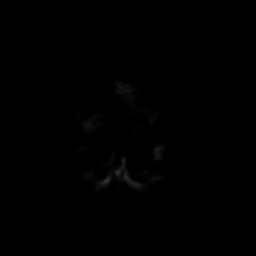

[Series 9: FLAIR · sagittal · 5.0mm · 0.47mm/px · 2 of 24 slices shown (2 of 2)]
[im 1/24]
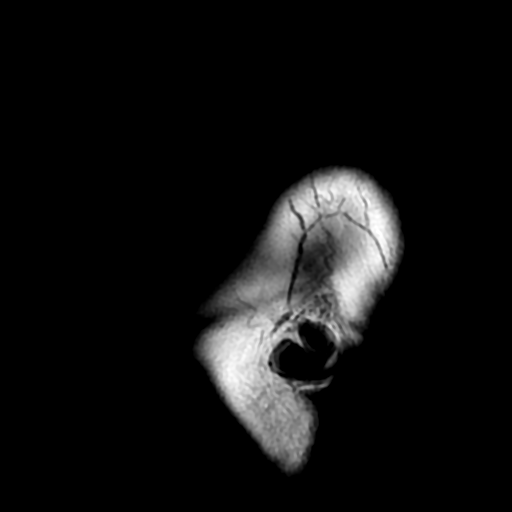
[im 24/24]
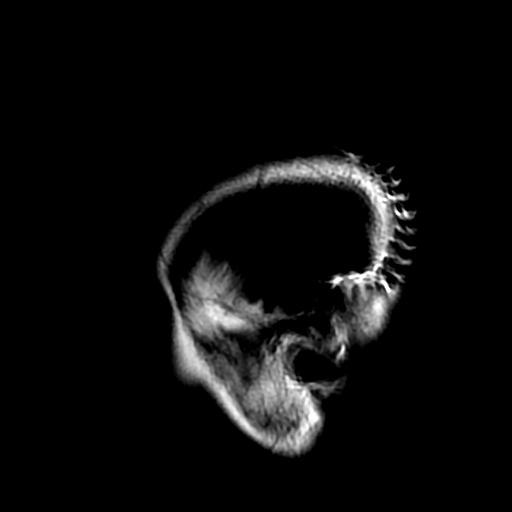

[Series 13: FLAIR post-contrast · sagittal · 5.0mm · 0.47mm/px · 2 of 24 slices shown (1 of 2)]
[im 1/24]
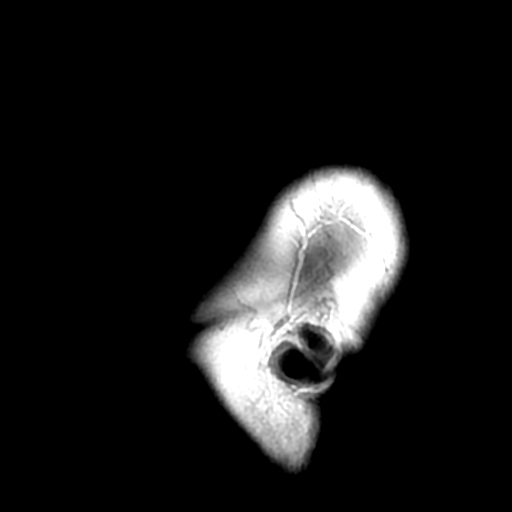
[im 24/24]
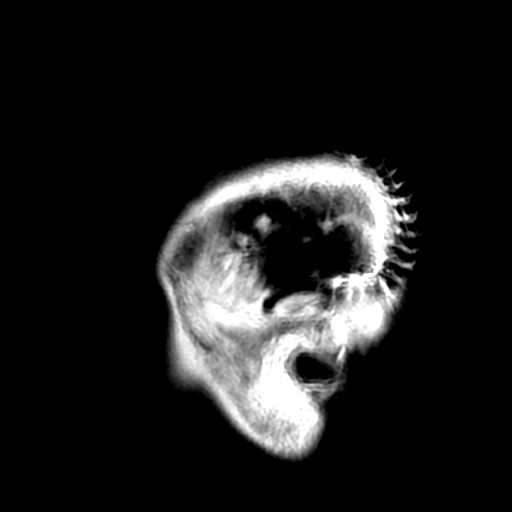

[Series 14: FLAIR post-contrast · coronal · 5.0mm · 0.39mm/px · 2 of 30 slices shown (2 of 2)]
[im 1/30]
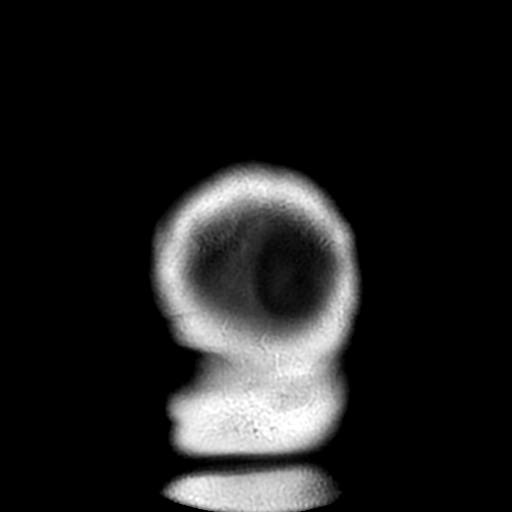
[im 30/30]
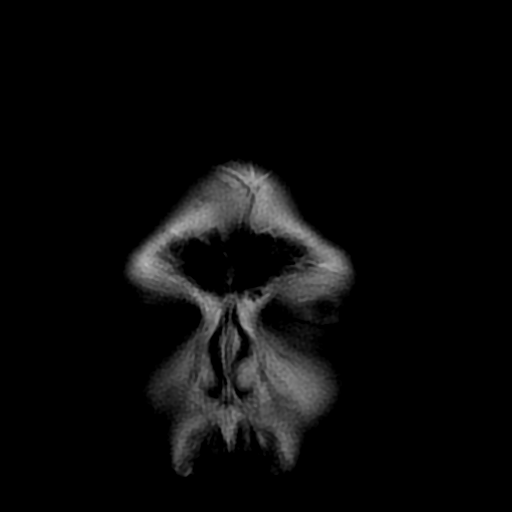

[Series 250: ADC · axial · 3.0mm · 0.94mm/px · z∈[-72,+75]mm · 4 of 50 slices shown (1 of 2)]
[im 1/50]
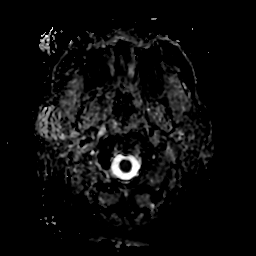
[im 17/50]
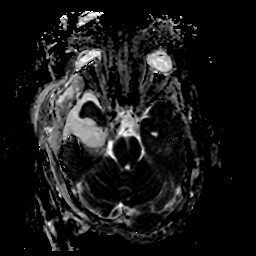
[im 33/50]
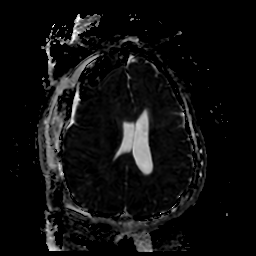
[im 50/50]
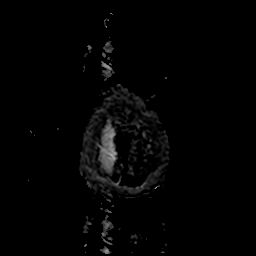

[Series 850: ADC · coronal · 4.0mm · 0.94mm/px · 3 of 36 slices shown (2 of 2)]
[im 1/36]
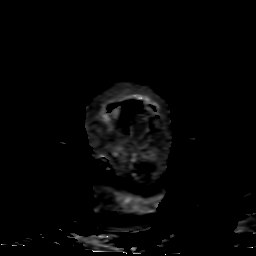
[im 18/36]
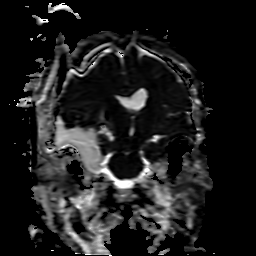
[im 36/36]
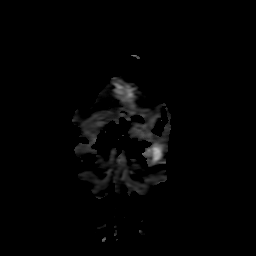

[28 of 48 positions shown; findings below may reference images not displayed]

FINDINGS: Brain: There are new postsurgical changes reflecting right
craniotomy for mass resection. There is expected postoperative fluid
and layering blood within the resection cavity and overlying the
right cerebral hemisphere. Postoperative pneumocephalus is seen
anteriorly. There is intrinsic T1 hyperintensity consistent with
blood products at the periphery of the resection site. There is no
residual masslike enhancement. Mild dural thickening and enhancement
over the right cerebral hemisphere is likely postoperative.

There is mild edema in the surrounding brain parenchyma, similar to
slightly decreased compared to the preoperative study. There remains
8 mm leftward midline shift, decreased from 1.1 cm on the
preoperative study. The mildly dilated left lateral ventricle is not
significantly changed.

There is confluent diffusion restriction in the temporal lobe
posterior to the resection cavity consistent with ischemia. There is
an additional small focus of diffusion restriction in the right
corona radiata consistent with ischemia.

Vascular: Normal flow voids.

Skull and upper cervical spine: Normal marrow signal.

Sinuses/Orbits: The paranasal sinuses are clear. The globes and
orbits are unremarkable.

Other: There is postoperative swelling confluent, blood, and air in
the right scalp.
IMPRESSION: 1. Postsurgical changes reflecting right craniotomy for mass
resection with no residual masslike enhancement about the resection
cavity.
2. Edema surrounding the resection cavity is similar to slightly
decreased compared to the preoperative study, with decreased
leftward midline shift now measuring 8 mm (previously 11 mm).
3. Small area of diffusion restriction in the right temporal lobe
posterior to the resection cavity and in the right corona radiata
consistent with ischemia.

## 2021-03-05 MED ORDER — GADOBUTROL 1 MMOL/ML IV SOLN
9.0000 mL | Freq: Once | INTRAVENOUS | Status: AC | PRN
  Administered 2021-03-05: 9 mL via INTRAVENOUS

## 2021-03-05 NOTE — Addendum Note (Signed)
Addendum  created 03/05/21 0714 by Georgia Duff, CRNA   Intraprocedure Event deleted, Intraprocedure Event edited

## 2021-03-05 NOTE — Progress Notes (Signed)
°  Transition of Care [facility]) Screening Note   Patient Details  Name: Kelly [name] Date of Birth: 23-Jun-1965   Transition of Care [facility]) CM/SW Contact:    Benard Halsted, LCSW Phone Number: 03/05/2021, 3:50 PM    Transition of Care Department [facility]) has reviewed patient and no TOC needs have been identified at this time. We will continue to monitor patient advancement through interdisciplinary progression rounds. If new patient transition needs arise, please place a TOC consult.

## 2021-03-05 NOTE — Progress Notes (Signed)
Subjective: The patient is somnolent but easily arousable.  She is in no apparent distress.  Objective: Vital signs in last 24 hours: Temp:  [97 F (36.1 C)-98.3 F (36.8 C)] 98.2 F (36.8 C) (02/14 0400) Pulse Rate:  [46-88] 55 (02/14 0800) Resp:  [9-20] 10 (02/14 0800) BP: (110-187)/(64-150) 139/71 (02/14 0800) SpO2:  [90 %-97 %] 92 % (02/14 0800) Weight:  [94.8 kg] 94.8 kg (02/14 0500) Estimated body mass index is 35.87 kg/m as calculated from the following:   Height as of this encounter: 5\' 4"  (1.626 m).   Weight as of this encounter: 94.8 kg.   Intake/Output from previous day: 02/13 0701 - 02/14 0700 In: 4411.1 [I.V.:4211.1; IV Piggyback:200] Out: 2725 [Urine:2225; Blood:500] Intake/Output this shift: No intake/output data recorded.  Physical exam the patient is alert and oriented x3.  She is slightly weak on the left.  Her speech is normal.  Her dressing is clean and dry.  Lab Results: Recent Labs    03/04/21 2015 03/05/21 0353  WBC  --  17.3*  HGB 11.2* 12.1  HCT 33.0* 37.5  PLT  --  375   BMET Recent Labs    03/04/21 0644 03/04/21 2015 03/05/21 0353  NA 139 141 138  K 4.1 3.8 4.0  CL 105 109 106  CO2 24  --  19*  GLUCOSE 118* 131* 155*  BUN 15 15 17   CREATININE 0.82 0.60 0.77  CALCIUM 9.2  --  8.7*    Studies/Results: No results found.  Assessment/Plan: Postop day #1 status post craniotomy for tumor: The patient is doing well.  We will check a postop MRI to judge the extent of resection.  I have answered all her questions.  Pathology is pending.  LOS: 4 days     [name] 03/05/2021, 8:03 AM     Patient ID: Kelly [name], female   DOB: 1965/08/05, 56 y.o.   MRN: [id number]

## 2021-03-06 LAB — BASIC METABOLIC PANEL
Anion gap: 8 (ref 5–15)
BUN: 16 mg/dL (ref 6–20)
CO2: 28 mmol/L (ref 22–32)
Calcium: 8.5 mg/dL — ABNORMAL LOW (ref 8.9–10.3)
Chloride: 104 mmol/L (ref 98–111)
Creatinine, Ser: 0.78 mg/dL (ref 0.44–1.00)
GFR, Estimated: >60 mL/min (ref >60)
Glucose, Bld: 106 mg/dL — ABNORMAL HIGH (ref 70–99)
Potassium: 3.5 mmol/L (ref 3.5–5.1)
Sodium: 140 mmol/L (ref 135–145)

## 2021-03-06 LAB — CULTURE, BLOOD (ROUTINE X 2)
Culture: NO GROWTH
Culture: NO GROWTH
Special Requests: ADEQUATE
Special Requests: ADEQUATE

## 2021-03-06 MED ORDER — PANTOPRAZOLE SODIUM 40 MG PO TBEC
40.0000 mg | DELAYED_RELEASE_TABLET | Freq: Every day | ORAL | Status: DC
  Administered 2021-03-06: 40 mg via ORAL
  Filled 2021-03-06: qty 1

## 2021-03-06 MED ORDER — LEVETIRACETAM 500 MG PO TABS
500.0000 mg | ORAL_TABLET | Freq: Two times a day (BID) | ORAL | Status: DC
  Administered 2021-03-06 – 2021-03-07 (×2): 500 mg via ORAL
  Filled 2021-03-06 (×2): qty 1

## 2021-03-06 NOTE — Progress Notes (Signed)
Subjective: The patient is alert and pleasant.  Her daughter and husband are at the bedside.  She is in no apparent distress.  Objective: Vital signs in last 24 hours: Temp:  [97.5 F (36.4 C)-98.4 F (36.9 C)] 97.7 F (36.5 C) (02/15 0800) Pulse Rate:  [50-75] 63 (02/15 1100) Resp:  [7-23] 12 (02/15 1100) BP: (94-143)/(48-113) 143/82 (02/15 1100) SpO2:  [92 %-97 %] 94 % (02/15 1100) Estimated body mass index is 35.87 kg/m as calculated from the following:   Height as of this encounter: 5\' 4"  (1.626 m).   Weight as of this encounter: 94.8 kg.   Intake/Output from previous day: 02/14 0701 - 02/15 0700 In: 322.3 [I.V.:122.3; IV Piggyback:200] Out: -  Intake/Output this shift: No intake/output data recorded.  Physical exam the patient is alert and oriented x3.  Her speech and strength is normal.  I reviewed the patient's follow-up brain MRI.  I do not see any residual tumor.  It looks good.  Lab Results: Recent Labs    03/04/21 2015 03/05/21 0353  WBC  --  17.3*  HGB 11.2* 12.1  HCT 33.0* 37.5  PLT  --  375   BMET Recent Labs    03/05/21 0353 03/06/21 0707  NA 138 140  K 4.0 3.5  CL 106 104  CO2 19* 28  GLUCOSE 155* 106*  BUN 17 16  CREATININE 0.77 0.78  CALCIUM 8.7* 8.5*    Studies/Results: MR BRAIN W WO CONTRAST  Result Date: 03/05/2021 CLINICAL DATA:  Right temporal mass status post resection EXAM: MRI HEAD WITHOUT AND WITH CONTRAST TECHNIQUE: Multiplanar, multiecho pulse sequences of the brain and surrounding structures were obtained without and with intravenous contrast. CONTRAST:  44mL GADAVIST GADOBUTROL 1 MMOL/ML IV SOLN COMPARISON:  Brain MRI most recently 03/01/2021 FINDINGS: Brain: There are new postsurgical changes reflecting right craniotomy for mass resection. There is expected postoperative fluid and layering blood within the resection cavity and overlying the right cerebral hemisphere. Postoperative pneumocephalus is seen anteriorly. There is  intrinsic T1 hyperintensity consistent with blood products at the periphery of the resection site. There is no residual masslike enhancement. Mild dural thickening and enhancement over the right cerebral hemisphere is likely postoperative. There is mild edema in the surrounding brain parenchyma, similar to slightly decreased compared to the preoperative study. There remains 8 mm leftward midline shift, decreased from 1.1 cm on the preoperative study. The mildly dilated left lateral ventricle is not significantly changed. There is confluent diffusion restriction in the temporal lobe posterior to the resection cavity consistent with ischemia. There is an additional small focus of diffusion restriction in the right corona radiata consistent with ischemia. Vascular: Normal flow voids. Skull and upper cervical spine: Normal marrow signal. Sinuses/Orbits: The paranasal sinuses are clear. The globes and orbits are unremarkable. Other: There is postoperative swelling confluent, blood, and air in the right scalp. IMPRESSION: 1. Postsurgical changes reflecting right craniotomy for mass resection with no residual masslike enhancement about the resection cavity. 2. Edema surrounding the resection cavity is similar to slightly decreased compared to the preoperative study, with decreased leftward midline shift now measuring 8 mm (previously 11 mm). 3. Small area of diffusion restriction in the right temporal lobe posterior to the resection cavity and in the right corona radiata consistent with ischemia. Electronically Signed   By: Valetta Mole M.D.   On: 03/05/2021 10:22    Assessment/Plan: Right brain tumor: I have discussed the situation with the patient.  She has multiple questions which  I can not answer accurately until the pathology has come back.  We will move her to the floor.  She will likely be discharged tomorrow can follow-up in the office for staple removal and review of her pathology and further treatment plans.   LOS: 5 days     [name] 03/06/2021, 12:34 PM     Patient ID: Kelly [name], female   DOB: 1965-11-20, 56 y.o.   MRN: [id number]

## 2021-03-07 LAB — BASIC METABOLIC PANEL
Anion gap: 8 (ref 5–15)
BUN: 15 mg/dL (ref 6–20)
CO2: 26 mmol/L (ref 22–32)
Calcium: 8.3 mg/dL — ABNORMAL LOW (ref 8.9–10.3)
Chloride: 102 mmol/L (ref 98–111)
Creatinine, Ser: 0.7 mg/dL (ref 0.44–1.00)
GFR, Estimated: >60 mL/min (ref >60)
Glucose, Bld: 111 mg/dL — ABNORMAL HIGH (ref 70–99)
Potassium: 3.6 mmol/L (ref 3.5–5.1)
Sodium: 136 mmol/L (ref 135–145)

## 2021-03-07 MED ORDER — METHYLPREDNISOLONE 4 MG PO TBPK: ORAL_TABLET | ORAL | 0 refills | Status: DC

## 2021-03-07 MED ORDER — LEVETIRACETAM 500 MG PO TABS: 500.0000 mg | ORAL_TABLET | Freq: Two times a day (BID) | ORAL | 0 refills | Status: DC

## 2021-03-07 MED ORDER — OXYCODONE-ACETAMINOPHEN 5-325 MG PO TABS: 1.0000 | ORAL_TABLET | ORAL | 0 refills | Status: DC | PRN

## 2021-03-07 NOTE — Discharge Summary (Signed)
Physician Discharge Summary     Providing Compassionate, Quality Care - Together   Patient ID: Kelly [name] MRN: [id number] DOB/AGE: 1965/11/05 56 y.o.  Admit date: 03/01/2021 Discharge date: 03/07/2021  Admission Diagnoses: Brain tumor  Discharge Diagnoses:  Principal Problem:   Brain tumor [facility])   Discharged Condition: good  Hospital Course: Patient underwent a craniotomy for tumor resection by Dr. Arnoldo Morale on 03/04/2021. She was admitted to the ICU following recovery from anesthesia and progressed to the med-surg unit prior to discharge. Her postoperative course has been uncomplicated. She is ambulating independently and without difficulty. She is tolerating a normal diet. Her pain is well-controlled with oral pain medication. She is ready for discharge home.   Consults: None  Significant Diagnostic Studies: radiology: MR BRAIN W WO CONTRAST  Result Date: 03/05/2021 CLINICAL DATA:  Right temporal mass status post resection EXAM: MRI HEAD WITHOUT AND WITH CONTRAST TECHNIQUE: Multiplanar, multiecho pulse sequences of the brain and surrounding structures were obtained without and with intravenous contrast. CONTRAST:  68mL GADAVIST GADOBUTROL 1 MMOL/ML IV SOLN COMPARISON:  Brain MRI most recently 03/01/2021 FINDINGS: Brain: There are new postsurgical changes reflecting right craniotomy for mass resection. There is expected postoperative fluid and layering blood within the resection cavity and overlying the right cerebral hemisphere. Postoperative pneumocephalus is seen anteriorly. There is intrinsic T1 hyperintensity consistent with blood products at the periphery of the resection site. There is no residual masslike enhancement. Mild dural thickening and enhancement over the right cerebral hemisphere is likely postoperative. There is mild edema in the surrounding brain parenchyma, similar to slightly decreased compared to the preoperative study. There remains 8 mm leftward midline shift,  decreased from 1.1 cm on the preoperative study. The mildly dilated left lateral ventricle is not significantly changed. There is confluent diffusion restriction in the temporal lobe posterior to the resection cavity consistent with ischemia. There is an additional small focus of diffusion restriction in the right corona radiata consistent with ischemia. Vascular: Normal flow voids. Skull and upper cervical spine: Normal marrow signal. Sinuses/Orbits: The paranasal sinuses are clear. The globes and orbits are unremarkable. Other: There is postoperative swelling confluent, blood, and air in the right scalp. IMPRESSION: 1. Postsurgical changes reflecting right craniotomy for mass resection with no residual masslike enhancement about the resection cavity. 2. Edema surrounding the resection cavity is similar to slightly decreased compared to the preoperative study, with decreased leftward midline shift now measuring 8 mm (previously 11 mm). 3. Small area of diffusion restriction in the right temporal lobe posterior to the resection cavity and in the right corona radiata consistent with ischemia. Electronically Signed   By: Valetta Mole M.D.   On: 03/05/2021 10:22     Treatments: surgery: Right frontotemporoparietal craniotomy for gross total resection of right temporal tumor using microdissection and BrainLab neuro navigation  Discharge Exam: Blood pressure (!) 158/74, pulse 73, temperature 98.8 F (37.1 C), temperature source Oral, resp. rate 13, height 5\' 4"  (1.626 m), weight 97.2 kg, SpO2 96 %.  Alert and oriented x 4 PERRLA Speech clear CN II-XII grossly intact MAE, Strength and sensation intact Incision is closed with staples; Incision is clean, dry, and intact   Disposition: Discharge disposition: 01-Home or Self Care        Allergies as of 03/07/2021       Reactions   Ranitidine Anaphylaxis        Medication List     STOP taking these medications    ibuprofen 200 MG  tablet Commonly  known as: ADVIL   oxyCODONE 5 MG immediate release tablet Commonly known as: Oxy IR/ROXICODONE       TAKE these medications    atorvastatin 10 MG tablet Commonly known as: LIPITOR Take 10 mg by mouth at bedtime.   escitalopram 10 MG tablet Commonly known as: LEXAPRO Take 10 mg by mouth at bedtime.   levETIRAcetam 500 MG tablet Commonly known as: KEPPRA Take 1 tablet (500 mg total) by mouth 2 (two) times daily.   lisinopril 10 MG tablet Commonly known as: ZESTRIL Take 1 tablet (10 mg total) by mouth daily.   methylPREDNISolone 4 MG Tbpk tablet Commonly known as: MEDROL DOSEPAK Follow directions on packet insert.   nicotine 14 mg/24hr patch Commonly known as: NICODERM CQ - dosed in mg/24 hours Place 1 patch (14 mg total) onto the skin daily.   oxyCODONE-acetaminophen 5-325 MG tablet Commonly known as: PERCOCET/ROXICET Take 1-2 tablets by mouth every 4 (four) hours as needed for moderate pain.   polyethylene glycol 17 g packet Commonly known as: MIRALAX / GLYCOLAX Take 17 g by mouth 2 (two) times daily. Until stooling regularly What changed:  when to take this reasons to take this additional instructions   topiramate 50 MG tablet Commonly known as: TOPAMAX Take 50 mg by mouth 2 (two) times daily.   traZODone 50 MG tablet Commonly known as: DESYREL Take 100 mg by mouth at bedtime.        Follow-up Information     Newman Pies, MD. Schedule an appointment as soon as possible for a visit in 10 day(s).   Specialty: Neurosurgery Why: For removal of staples Contact information: [address] [place] [address] [phone]                 Signed: Viona Gilmore, DNP, AGNP-C Nurse Practitioner  [facility] [place] [address], [address], [place], [place] [address] P: [phone]     F: [phone]  03/07/2021, 11:34 AM

## 2021-03-07 NOTE — Discharge Instructions (Signed)
Wound Care Keep incision covered and dry for two days.    Do not put any creams, lotions, or ointments on incision. Staples will be removed in the office.  Activity Walk each and every day, increasing distance each day. No lifting greater than 5 lbs. No driving for 2 weeks; may ride as a passenger locally.  Diet Resume your normal diet.   Return to Work Will be discussed at your follow up appointment.  Call Your Doctor If Any of These Occur Redness, drainage, or swelling at the wound.  Temperature greater than 101 degrees. Severe pain not relieved by pain medication. Incision starts to come apart.  Follow Up Appt Call today for appointment in 7 days [phone]) or for problems.  If you have any hardware placed in your spine, you will need an x-ray before your appointment.

## 2021-03-11 ENCOUNTER — Other Ambulatory Visit (HOSPITAL_COMMUNITY): Payer: No Typology Code available for payment source

## 2021-03-14 ENCOUNTER — Telehealth: Payer: Self-pay | Admitting: Internal Medicine

## 2021-03-14 ENCOUNTER — Inpatient Hospital Stay: Admit: 2021-03-14 | Payer: No Typology Code available for payment source | Admitting: Neurosurgery

## 2021-03-14 SURGERY — CRANIOTOMY TUMOR EXCISION
Anesthesia: General | Laterality: Right

## 2021-03-14 NOTE — Telephone Encounter (Signed)
Scheduled appt per 2/22 referral. Pt is aware of appt date and time. Pt is aware to arrive 15 mins prior to appt time and to bring and updated insurance card. Pt is aware of appt location.   

## 2021-03-15 ENCOUNTER — Other Ambulatory Visit: Payer: Self-pay | Admitting: Radiation Therapy

## 2021-03-18 ENCOUNTER — Inpatient Hospital Stay: Payer: No Typology Code available for payment source | Attending: Neurosurgery

## 2021-03-18 ENCOUNTER — Other Ambulatory Visit: Payer: Self-pay | Admitting: Radiation Therapy

## 2021-03-21 ENCOUNTER — Ambulatory Visit: Payer: No Typology Code available for payment source | Admitting: Internal Medicine

## 2021-03-25 ENCOUNTER — Inpatient Hospital Stay: Payer: No Typology Code available for payment source

## 2021-03-25 ENCOUNTER — Encounter (HOSPITAL_COMMUNITY): Payer: Self-pay

## 2021-03-25 DIAGNOSIS — I10 Essential (primary) hypertension: Secondary | ICD-10-CM | POA: Insufficient documentation

## 2021-03-25 DIAGNOSIS — C712 Malignant neoplasm of temporal lobe: Secondary | ICD-10-CM | POA: Insufficient documentation

## 2021-03-25 DIAGNOSIS — F17210 Nicotine dependence, cigarettes, uncomplicated: Secondary | ICD-10-CM | POA: Insufficient documentation

## 2021-03-25 LAB — SURGICAL PATHOLOGY
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
## 2021-03-28 ENCOUNTER — Telehealth: Payer: Self-pay

## 2021-03-28 ENCOUNTER — Encounter: Payer: Self-pay | Admitting: *Deleted

## 2021-03-28 ENCOUNTER — Encounter: Payer: Self-pay | Admitting: Internal Medicine

## 2021-03-28 ENCOUNTER — Ambulatory Visit
Admission: RE | Admit: 2021-03-28 | Discharge: 2021-03-28 | Disposition: A | Discharge status: Home / Self Care | Payer: No Typology Code available for payment source | Source: Ambulatory Visit | Attending: Radiation Oncology | Admitting: Radiation Oncology

## 2021-03-28 ENCOUNTER — Other Ambulatory Visit (HOSPITAL_COMMUNITY): Payer: Self-pay

## 2021-03-28 ENCOUNTER — Telehealth: Payer: Self-pay | Admitting: Pharmacist

## 2021-03-28 ENCOUNTER — Other Ambulatory Visit: Payer: Self-pay

## 2021-03-28 ENCOUNTER — Inpatient Hospital Stay (HOSPITAL_BASED_OUTPATIENT_CLINIC_OR_DEPARTMENT_OTHER): Payer: No Typology Code available for payment source | Admitting: Internal Medicine

## 2021-03-28 VITALS — BP 125/81 | HR 114 | Temp 96.6°F | Resp 18 | Ht 64.0 in | Wt 205.0 lb

## 2021-03-28 VITALS — BP 125/84 | HR 115 | Temp 97.9°F | Resp 18 | Wt 204.3 lb

## 2021-03-28 DIAGNOSIS — E785 Hyperlipidemia, unspecified: Secondary | ICD-10-CM | POA: Insufficient documentation

## 2021-03-28 DIAGNOSIS — F17210 Nicotine dependence, cigarettes, uncomplicated: Secondary | ICD-10-CM | POA: Insufficient documentation

## 2021-03-28 DIAGNOSIS — I10 Essential (primary) hypertension: Secondary | ICD-10-CM | POA: Diagnosis not present

## 2021-03-28 DIAGNOSIS — C712 Malignant neoplasm of temporal lobe: Secondary | ICD-10-CM | POA: Diagnosis not present

## 2021-03-28 DIAGNOSIS — C719 Malignant neoplasm of brain, unspecified: Secondary | ICD-10-CM

## 2021-03-28 DIAGNOSIS — M129 Arthropathy, unspecified: Secondary | ICD-10-CM | POA: Insufficient documentation

## 2021-03-28 DIAGNOSIS — E039 Hypothyroidism, unspecified: Secondary | ICD-10-CM | POA: Insufficient documentation

## 2021-03-28 DIAGNOSIS — Z510 Encounter for antineoplastic radiation therapy: Secondary | ICD-10-CM | POA: Diagnosis not present

## 2021-03-28 DIAGNOSIS — Z79899 Other long term (current) drug therapy: Secondary | ICD-10-CM | POA: Insufficient documentation

## 2021-03-28 MED ORDER — SODIUM CHLORIDE 0.9% FLUSH
10.0000 mL | Freq: Once | INTRAVENOUS | Status: AC
  Administered 2021-03-28: 15:00:00 10 mL via INTRAVENOUS

## 2021-03-28 MED ORDER — TEMOZOLOMIDE 140 MG PO CAPS: 140.0000 mg | ORAL_CAPSULE | Freq: Every day | ORAL | 0 refills | Status: DC

## 2021-03-28 MED ORDER — ONDANSETRON HCL 8 MG PO TABS
8.0000 mg | ORAL_TABLET | Freq: Two times a day (BID) | ORAL | 1 refills | Status: DC | PRN
  Filled 2021-03-28: qty 30, 15d supply, fill #0

## 2021-03-28 MED ORDER — TEMOZOLOMIDE 140 MG PO CAPS
140.0000 mg | ORAL_CAPSULE | Freq: Every day | ORAL | 0 refills | Status: DC
  Filled 2021-03-28: qty 45, 45d supply, fill #0

## 2021-03-28 NOTE — Telephone Encounter (Signed)
Oral Oncology Patient Advocate Encounter ?  ?Received notification from [employer] that prior authorization for Temodar is required. ?  ?PA submitted on CoverMyMeds ?Key [id number] ?Status is pending ?  ?Oral Oncology Clinic will continue to follow. ? ?Wynn Maudlin CPHT ?Specialty Pharmacy Patient Advocate ?[place] ?Phone [phone] ?Fax [phone] ?03/28/2021 11:17 AM ? ? ?

## 2021-03-28 NOTE — Telephone Encounter (Signed)
Oral Oncology Patient Advocate Encounter ? ?Prior Authorization for Temodar has been approved.   ? ?PA# [id number] ?Effective dates: 03/28/21 through 03/29/22 ? ?Patient must fill at [facility] ? ?Oral Oncology Clinic will continue to follow.  ? ?Wynn Maudlin CPHT ?Specialty Pharmacy Patient Advocate ?[place] ?Phone [phone] ?Fax [phone] ?03/28/2021 12:00 PM ? ?

## 2021-03-28 NOTE — Telephone Encounter (Addendum)
Oral Oncology Pharmacist Encounter ? ?Received new prescription for Temodar (temozolomide) for the treatment of glioblastoma in conjunction with radiation, planned duration 42 days. ? ?BMP from 03/07/21 and CBC from 03/05/21 assessed, no baseline dose adjustments required. Prescription dose and frequency assessed for appropriateness. ? ?Current medication list in Epic reviewed, no relevant/significant DDIs with Temdoar identified. ? ?Evaluated chart and no patient barriers to medication adherence noted.  ? ?Patient agreement for treatment documented in MD note on 03/28/21. ? ?Patient's insurance requires that temozolomide be filled through [employer] Prescription redirected for dispensing.  ? ?Oral Oncology Clinic will continue to follow for insurance authorization, copayment issues, initial counseling and start date. ? ?Leron Croak, PharmD, BCPS ?Hematology/Oncology Clinical Pharmacist ?[facility] ?[phone] ?03/28/2021 11:16 AM ? ?

## 2021-03-28 NOTE — Progress Notes (Signed)
START ON PATHWAY REGIMEN - Neuro ? ? ?  One cycle, concurrent with RT: ?    Temozolomide  ? ?**Always confirm dose/schedule in your pharmacy ordering system** ? ?Patient Characteristics: ?Glioma, Glioblastoma, IDH-wildtype, Newly Diagnosed / Treatment Naive, Good Performance Status and/or Younger Patient, MGMT Promoter Unmethylated/Unknown ?Disease Classification: Glioma ?Disease Classification: Glioblastoma, IDH-wildtype ?Disease Status: Newly Diagnosed / Treatment Naive ?Performance Status: Good Performance Status and/or Younger Patient ?MGMT Promoter Methylation Status: Awaiting Test Results ?Intent of Therapy: ?Non-Curative / Palliative Intent, Discussed with Patient ?

## 2021-03-28 NOTE — Progress Notes (Signed)
Has armband been applied?  Yes ? ?Does patient have an allergy to IV contrast dye?: No ?  ?Has patient ever received premedication for IV contrast dye?: N/A ? ?Does patient take metformin?: No ? ?If patient does take metformin when was the last dose: N/A ? ?Date of lab work: 03/07/2021 ?BUN: 15 ?CR: 0.70 ?eGfr: >60 ? ?IV site: Right Anterior proximal Forearm ? ?Has IV site been added to flowsheet?  Yes ? ?BP 125/81 (BP Location: Left Arm, Patient Position: Sitting)   Pulse (!) 114   Temp (!) 96.6 ?F (35.9 ?C) (Temporal)   Resp 18   Ht _0  (1.626 m)   Wt 205 lb (93 kg)   SpO2 97%   BMI 35.19 kg/m?   ? ? ?

## 2021-03-28 NOTE — Addendum Note (Signed)
Encounter addended by: Cori Razor, RN on: 03/28/2021 12:42 PM ? Actions taken: Flowsheet accepted
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
## 2021-03-28 NOTE — Research (Signed)
[id number] A Single Arm, Pilot Study of Ramipril for Preventing Radiation-Induced Cognitive Decline in Glioblastoma (GBM) Patients Receiving Brain Radiotherapy  ? ?Dr. Mickeal Skinner referred this pt for the above study.  The research nurse met with Dr. Mickeal Skinner about the pt's current medication, Lisinopril.  The exclusion criteria (4.2.5) states pt's can discontinue their ACE inhibitors for 1 week and be eligible for enrollment.  Dr. Mickeal Skinner did not want to stop the pt's Lisinopril and delay the pt's treatment start.  Therefore, the research nurse met with the pt briefly in CT simulation this afternoon, and the pt was informed that she does not currently meet all of the criteria for enrollment.  The pt thanked the nurse for letting her know that she will not enroll into the study.  The nurse also told Felicity Pellegrini, medical dosimetrist, that the pt will not enroll on the study.   ?Brion Aliment RN, BSN, CCRP ?Clinical Research Nurse Lead ?03/28/2021 3:24 PM   ?
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
## 2021-03-29 MED ORDER — ONDANSETRON HCL 8 MG PO TABS: 8.0000 mg | ORAL_TABLET | Freq: Two times a day (BID) | ORAL | 1 refills | Status: DC | PRN

## 2021-04-01 ENCOUNTER — Telehealth: Payer: Self-pay | Admitting: *Deleted

## 2021-04-01 ENCOUNTER — Encounter: Payer: Self-pay | Admitting: Internal Medicine

## 2021-04-01 NOTE — Telephone Encounter (Signed)
Saw patient my chart message.  Called and confirmed that patient had already gotten these questions answered by our oral oncology staff.  ?

## 2021-04-01 NOTE — Telephone Encounter (Signed)
Oral Chemotherapy Pharmacist Encounter ? ?I spoke with patient for overview of: Temodar for the treatment of glioblastoma multiforme in conjunction with radiation, planned duration concomitant phase 42 days of therapy.  ? ?Counseled patient on administration, dosing, side effects, monitoring, drug-food interactions, safe handling, storage, and disposal. ? ?Patient will take Temodar '140mg'$  capsules, 1 capsule, (140 mg total daily dose), by mouth once daily, may take at bedtime and on an empty stomach to decrease nausea and vomiting. ? ?Patient will take Temodar concurrent with radiation for 42 days straight. ? ?Temodar start date: 04/02/21 PM ?Radiation start date: 04/03/21 ?  ?Prophylactic Zofran will not be used at initiation of concurrent phase, but will be initiated if nausea develops despite Temodar administration on an empty stomach and at bedtime. If needed, patient will take Zofran '8mg'$  tablet, 1 tablet by mouth 30-60 min prior to Temodar dose to help decrease N/V. ?  ?Adverse effects include but are not limited to: nausea, vomiting, anorexia, GI upset, rash, headache, and fatigue. Rare but serious adverse effects of pneumocystis pneumonia and secondary malignancy also discussed. ? ?PCP prophylaxis will not be initiated at this time, but may be added based on lymphocyte count in the future. ? ?Reviewed with patient importance of keeping a medication schedule and plan for any missed doses. No barriers to medication adherence identified. ? ?Medication reconciliation performed and medication/allergy list updated. ? ?Insurance authorization for Temodar has been obtained. Patient's insurance requires this to be filled through [employer]  ? ?All questions answered. ? ?Ms. Curl voiced understanding and appreciation.  ? ?Medication education handout and medication calendar will be given to patient on 04/03/21. Patient knows to call the office with questions or concerns. Oral Chemotherapy Clinic phone  number provided to patient.  ? ?Leron Croak, PharmD, BCPS ?Hematology/Oncology Clinical Pharmacist ?[facility] Oral Chemotherapy Navigation Clinics ?[phone] ?04/01/2021 10:04 AM ? ?

## 2021-04-02 DIAGNOSIS — Z510 Encounter for antineoplastic radiation therapy: Secondary | ICD-10-CM | POA: Diagnosis not present

## 2021-04-03 ENCOUNTER — Ambulatory Visit
Admission: RE | Admit: 2021-04-03 | Discharge: 2021-04-03 | Disposition: A | Discharge status: Home / Self Care | Payer: No Typology Code available for payment source | Source: Ambulatory Visit | Attending: Radiation Oncology | Admitting: Radiation Oncology

## 2021-04-03 ENCOUNTER — Other Ambulatory Visit: Payer: Self-pay

## 2021-04-03 ENCOUNTER — Telehealth: Payer: Self-pay | Admitting: Internal Medicine

## 2021-04-03 DIAGNOSIS — Z510 Encounter for antineoplastic radiation therapy: Secondary | ICD-10-CM | POA: Diagnosis not present

## 2021-04-03 NOTE — Telephone Encounter (Signed)
.  Called patient to schedule appointment per 3/13 inbasket, patient is aware of date and time.   ?

## 2021-04-04 ENCOUNTER — Other Ambulatory Visit: Payer: Self-pay

## 2021-04-04 ENCOUNTER — Ambulatory Visit
Admission: RE | Admit: 2021-04-04 | Discharge: 2021-04-04 | Disposition: A | Discharge status: Home / Self Care | Payer: No Typology Code available for payment source | Source: Ambulatory Visit | Attending: Radiation Oncology | Admitting: Radiation Oncology

## 2021-04-04 DIAGNOSIS — Z510 Encounter for antineoplastic radiation therapy: Secondary | ICD-10-CM | POA: Diagnosis not present

## 2021-04-05 ENCOUNTER — Ambulatory Visit
Admission: RE | Admit: 2021-04-05 | Discharge: 2021-04-05 | Disposition: A | Discharge status: Home / Self Care | Payer: No Typology Code available for payment source | Source: Ambulatory Visit | Attending: Radiation Oncology | Admitting: Radiation Oncology

## 2021-04-05 DIAGNOSIS — C719 Malignant neoplasm of brain, unspecified: Secondary | ICD-10-CM

## 2021-04-05 DIAGNOSIS — Z510 Encounter for antineoplastic radiation therapy: Secondary | ICD-10-CM | POA: Diagnosis not present

## 2021-04-05 MED ORDER — SONAFINE EX EMUL
1.0000 "application " | Freq: Once | CUTANEOUS | Status: AC
  Administered 2021-04-05: 1 via TOPICAL

## 2021-04-05 NOTE — Progress Notes (Signed)
Pt here for patient teaching.  Pt given Radiation and You booklet, skin care instructions, and Sonafine.  Reviewed areas of pertinence such as fatigue, hair loss, nausea and vomiting, skin changes, headache, and blurry vision . Pt able to give teach back of to pat skin and use unscented/gentle soap,apply Sonafine bid and avoid applying anything to skin within 4 hours of treatment. Pt verbalizes understanding of information given and will contact nursing with any questions or concerns.    Cobie Marcoux M. Tonilynn Bieker RN, BSN  

## 2021-04-08 ENCOUNTER — Ambulatory Visit
Admission: RE | Admit: 2021-04-08 | Discharge: 2021-04-08 | Disposition: A | Discharge status: Home / Self Care | Payer: No Typology Code available for payment source | Source: Ambulatory Visit | Attending: Radiation Oncology | Admitting: Radiation Oncology

## 2021-04-08 ENCOUNTER — Other Ambulatory Visit: Payer: Self-pay

## 2021-04-08 DIAGNOSIS — Z510 Encounter for antineoplastic radiation therapy: Secondary | ICD-10-CM | POA: Diagnosis not present

## 2021-04-09 ENCOUNTER — Ambulatory Visit
Admission: RE | Admit: 2021-04-09 | Discharge: 2021-04-09 | Disposition: A | Discharge status: Home / Self Care | Payer: No Typology Code available for payment source | Source: Ambulatory Visit | Attending: Radiation Oncology | Admitting: Radiation Oncology

## 2021-04-09 DIAGNOSIS — Z510 Encounter for antineoplastic radiation therapy: Secondary | ICD-10-CM | POA: Diagnosis not present

## 2021-04-10 ENCOUNTER — Ambulatory Visit
Admission: RE | Admit: 2021-04-10 | Discharge: 2021-04-10 | Disposition: A | Discharge status: Home / Self Care | Payer: No Typology Code available for payment source | Source: Ambulatory Visit | Attending: Radiation Oncology | Admitting: Radiation Oncology

## 2021-04-10 ENCOUNTER — Other Ambulatory Visit: Payer: Self-pay

## 2021-04-10 DIAGNOSIS — Z510 Encounter for antineoplastic radiation therapy: Secondary | ICD-10-CM | POA: Diagnosis not present

## 2021-04-11 ENCOUNTER — Ambulatory Visit
Admission: RE | Admit: 2021-04-11 | Discharge: 2021-04-11 | Disposition: A | Discharge status: Home / Self Care | Payer: No Typology Code available for payment source | Source: Ambulatory Visit | Attending: Radiation Oncology | Admitting: Radiation Oncology

## 2021-04-11 DIAGNOSIS — Z510 Encounter for antineoplastic radiation therapy: Secondary | ICD-10-CM | POA: Diagnosis not present

## 2021-04-12 ENCOUNTER — Other Ambulatory Visit: Payer: Self-pay

## 2021-04-12 ENCOUNTER — Ambulatory Visit
Admission: RE | Admit: 2021-04-12 | Discharge: 2021-04-12 | Disposition: A | Discharge status: Home / Self Care | Payer: No Typology Code available for payment source | Source: Ambulatory Visit | Attending: Radiation Oncology | Admitting: Radiation Oncology

## 2021-04-12 DIAGNOSIS — Z510 Encounter for antineoplastic radiation therapy: Secondary | ICD-10-CM | POA: Diagnosis not present

## 2021-04-15 ENCOUNTER — Ambulatory Visit
Admission: RE | Admit: 2021-04-15 | Discharge: 2021-04-15 | Disposition: A | Discharge status: Home / Self Care | Payer: No Typology Code available for payment source | Source: Ambulatory Visit | Attending: Radiation Oncology | Admitting: Radiation Oncology

## 2021-04-15 DIAGNOSIS — Z510 Encounter for antineoplastic radiation therapy: Secondary | ICD-10-CM | POA: Diagnosis not present

## 2021-04-16 ENCOUNTER — Ambulatory Visit
Admission: RE | Admit: 2021-04-16 | Discharge: 2021-04-16 | Disposition: A | Discharge status: Home / Self Care | Payer: No Typology Code available for payment source | Source: Ambulatory Visit | Attending: Radiation Oncology | Admitting: Radiation Oncology

## 2021-04-16 ENCOUNTER — Inpatient Hospital Stay (HOSPITAL_BASED_OUTPATIENT_CLINIC_OR_DEPARTMENT_OTHER): Payer: No Typology Code available for payment source | Admitting: Internal Medicine

## 2021-04-16 ENCOUNTER — Inpatient Hospital Stay: Payer: No Typology Code available for payment source

## 2021-04-16 ENCOUNTER — Other Ambulatory Visit: Payer: Self-pay

## 2021-04-16 VITALS — BP 116/57 | HR 84 | Temp 98.1°F | Resp 17 | Ht 64.0 in | Wt 200.7 lb

## 2021-04-16 DIAGNOSIS — C719 Malignant neoplasm of brain, unspecified: Secondary | ICD-10-CM

## 2021-04-16 DIAGNOSIS — Z510 Encounter for antineoplastic radiation therapy: Secondary | ICD-10-CM | POA: Diagnosis not present

## 2021-04-16 LAB — CBC WITH DIFFERENTIAL (CANCER CENTER ONLY)
Abs Immature Granulocytes: 0.01 10*3/uL (ref 0.00–0.07)
Basophils Absolute: 0 10*3/uL (ref 0.0–0.1)
Basophils Relative: 1 %
Eosinophils Absolute: 0.1 10*3/uL (ref 0.0–0.5)
Eosinophils Relative: 3 %
HCT: 41.8 % (ref 36.0–46.0)
Hemoglobin: 13.7 g/dL (ref 12.0–15.0)
Immature Granulocytes: 0 %
Lymphocytes Relative: 27 %
Lymphs Abs: 1.3 10*3/uL (ref 0.7–4.0)
MCH: 30.8 pg (ref 26.0–34.0)
MCHC: 32.8 g/dL (ref 30.0–36.0)
MCV: 93.9 fL (ref 80.0–100.0)
Monocytes Absolute: 0.3 10*3/uL (ref 0.1–1.0)
Monocytes Relative: 6 %
Neutro Abs: 3.1 10*3/uL (ref 1.7–7.7)
Neutrophils Relative %: 63 %
Platelet Count: 230 10*3/uL (ref 150–400)
RBC: 4.45 MIL/uL (ref 3.87–5.11)
RDW: 13.4 % (ref 11.5–15.5)
WBC Count: 4.8 10*3/uL (ref 4.0–10.5)
nRBC: 0 % (ref 0.0–0.2)

## 2021-04-16 LAB — CMP (CANCER CENTER ONLY)
ALT: 32 U/L (ref 0–44)
AST: 24 U/L (ref 15–41)
Albumin: 4 g/dL (ref 3.5–5.0)
Alkaline Phosphatase: 109 U/L (ref 38–126)
Anion gap: 7 (ref 5–15)
BUN: 9 mg/dL (ref 6–20)
CO2: 29 mmol/L (ref 22–32)
Calcium: 9.2 mg/dL (ref 8.9–10.3)
Chloride: 106 mmol/L (ref 98–111)
Creatinine: 0.92 mg/dL (ref 0.44–1.00)
GFR, Estimated: >60 mL/min (ref >60)
Glucose, Bld: 103 mg/dL — ABNORMAL HIGH (ref 70–99)
Potassium: 4.2 mmol/L (ref 3.5–5.1)
Sodium: 142 mmol/L (ref 135–145)
Total Bilirubin: 0.6 mg/dL (ref 0.3–1.2)
Total Protein: 6.6 g/dL (ref 6.5–8.1)

## 2021-04-16 MED ORDER — ONDANSETRON HCL 8 MG PO TABS: 8.0000 mg | ORAL_TABLET | Freq: Two times a day (BID) | ORAL | 1 refills | Status: DC | PRN
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
## 2021-04-17 ENCOUNTER — Ambulatory Visit
Admission: RE | Admit: 2021-04-17 | Discharge: 2021-04-17 | Disposition: A | Discharge status: Home / Self Care | Payer: No Typology Code available for payment source | Source: Ambulatory Visit | Attending: Radiation Oncology | Admitting: Radiation Oncology

## 2021-04-17 DIAGNOSIS — Z510 Encounter for antineoplastic radiation therapy: Secondary | ICD-10-CM | POA: Diagnosis not present

## 2021-04-18 ENCOUNTER — Other Ambulatory Visit: Payer: Self-pay

## 2021-04-18 ENCOUNTER — Ambulatory Visit
Admission: RE | Admit: 2021-04-18 | Discharge: 2021-04-18 | Disposition: A | Discharge status: Home / Self Care | Payer: No Typology Code available for payment source | Source: Ambulatory Visit | Attending: Radiation Oncology | Admitting: Radiation Oncology

## 2021-04-18 DIAGNOSIS — Z510 Encounter for antineoplastic radiation therapy: Secondary | ICD-10-CM | POA: Diagnosis not present

## 2021-04-19 ENCOUNTER — Ambulatory Visit
Admission: RE | Admit: 2021-04-19 | Discharge: 2021-04-19 | Disposition: A | Discharge status: Home / Self Care | Payer: No Typology Code available for payment source | Source: Ambulatory Visit | Attending: Radiation Oncology | Admitting: Radiation Oncology

## 2021-04-19 DIAGNOSIS — Z510 Encounter for antineoplastic radiation therapy: Secondary | ICD-10-CM | POA: Diagnosis not present

## 2021-04-22 ENCOUNTER — Ambulatory Visit
Admission: RE | Admit: 2021-04-22 | Discharge: 2021-04-22 | Disposition: A | Discharge status: Home / Self Care | Payer: No Typology Code available for payment source | Source: Ambulatory Visit | Attending: Radiation Oncology | Admitting: Radiation Oncology

## 2021-04-22 ENCOUNTER — Other Ambulatory Visit: Payer: Self-pay

## 2021-04-22 DIAGNOSIS — F17210 Nicotine dependence, cigarettes, uncomplicated: Secondary | ICD-10-CM | POA: Diagnosis not present

## 2021-04-22 DIAGNOSIS — R911 Solitary pulmonary nodule: Secondary | ICD-10-CM | POA: Insufficient documentation

## 2021-04-22 DIAGNOSIS — I10 Essential (primary) hypertension: Secondary | ICD-10-CM | POA: Diagnosis not present

## 2021-04-22 DIAGNOSIS — C712 Malignant neoplasm of temporal lobe: Secondary | ICD-10-CM | POA: Diagnosis not present

## 2021-04-23 ENCOUNTER — Telehealth: Payer: Self-pay

## 2021-04-23 ENCOUNTER — Ambulatory Visit
Admission: RE | Admit: 2021-04-23 | Discharge: 2021-04-23 | Disposition: A | Discharge status: Home / Self Care | Payer: No Typology Code available for payment source | Source: Ambulatory Visit | Attending: Radiation Oncology | Admitting: Radiation Oncology

## 2021-04-23 DIAGNOSIS — C712 Malignant neoplasm of temporal lobe: Secondary | ICD-10-CM | POA: Diagnosis not present

## 2021-04-23 NOTE — Telephone Encounter (Signed)
Pt called after hours nurse stating she was taking Zofran 3 times a day for nausea and vomiting and it has stopped working. She was prescribed a standing order of Phenergan '25mg'$  by after hours RN, [employer]. ?

## 2021-04-24 ENCOUNTER — Ambulatory Visit
Admission: RE | Admit: 2021-04-24 | Discharge: 2021-04-24 | Disposition: A | Discharge status: Home / Self Care | Payer: No Typology Code available for payment source | Source: Ambulatory Visit | Attending: Radiation Oncology | Admitting: Radiation Oncology

## 2021-04-24 ENCOUNTER — Other Ambulatory Visit: Payer: Self-pay

## 2021-04-24 DIAGNOSIS — C712 Malignant neoplasm of temporal lobe: Secondary | ICD-10-CM | POA: Diagnosis not present

## 2021-04-25 ENCOUNTER — Ambulatory Visit
Admission: RE | Admit: 2021-04-25 | Discharge: 2021-04-25 | Disposition: A | Discharge status: Home / Self Care | Payer: No Typology Code available for payment source | Source: Ambulatory Visit | Attending: Radiation Oncology | Admitting: Radiation Oncology

## 2021-04-25 DIAGNOSIS — C712 Malignant neoplasm of temporal lobe: Secondary | ICD-10-CM | POA: Insufficient documentation

## 2021-04-25 DIAGNOSIS — Z510 Encounter for antineoplastic radiation therapy: Secondary | ICD-10-CM | POA: Insufficient documentation

## 2021-04-25 DIAGNOSIS — I10 Essential (primary) hypertension: Secondary | ICD-10-CM | POA: Insufficient documentation

## 2021-04-25 DIAGNOSIS — R911 Solitary pulmonary nodule: Secondary | ICD-10-CM | POA: Insufficient documentation

## 2021-04-25 DIAGNOSIS — F17210 Nicotine dependence, cigarettes, uncomplicated: Secondary | ICD-10-CM | POA: Insufficient documentation

## 2021-04-26 ENCOUNTER — Ambulatory Visit
Admission: RE | Admit: 2021-04-26 | Discharge: 2021-04-26 | Disposition: A | Discharge status: Home / Self Care | Payer: No Typology Code available for payment source | Source: Ambulatory Visit | Attending: Radiation Oncology | Admitting: Radiation Oncology

## 2021-04-26 ENCOUNTER — Other Ambulatory Visit: Payer: Self-pay

## 2021-04-26 DIAGNOSIS — C712 Malignant neoplasm of temporal lobe: Secondary | ICD-10-CM | POA: Diagnosis not present

## 2021-04-29 ENCOUNTER — Ambulatory Visit
Admission: RE | Admit: 2021-04-29 | Discharge: 2021-04-29 | Disposition: A | Discharge status: Home / Self Care | Payer: No Typology Code available for payment source | Source: Ambulatory Visit | Attending: Radiation Oncology | Admitting: Radiation Oncology

## 2021-04-29 ENCOUNTER — Other Ambulatory Visit: Payer: Self-pay

## 2021-04-29 DIAGNOSIS — C712 Malignant neoplasm of temporal lobe: Secondary | ICD-10-CM | POA: Diagnosis not present

## 2021-04-30 ENCOUNTER — Ambulatory Visit
Admission: RE | Admit: 2021-04-30 | Discharge: 2021-04-30 | Disposition: A | Discharge status: Home / Self Care | Payer: No Typology Code available for payment source | Source: Ambulatory Visit | Attending: Radiation Oncology | Admitting: Radiation Oncology

## 2021-04-30 ENCOUNTER — Inpatient Hospital Stay: Payer: No Typology Code available for payment source | Attending: Neurosurgery

## 2021-04-30 ENCOUNTER — Inpatient Hospital Stay (HOSPITAL_BASED_OUTPATIENT_CLINIC_OR_DEPARTMENT_OTHER): Payer: No Typology Code available for payment source | Admitting: Internal Medicine

## 2021-04-30 VITALS — BP 143/82 | HR 68 | Temp 98.2°F | Resp 16 | Ht 64.0 in | Wt 198.2 lb

## 2021-04-30 DIAGNOSIS — C719 Malignant neoplasm of brain, unspecified: Secondary | ICD-10-CM | POA: Diagnosis not present

## 2021-04-30 DIAGNOSIS — C712 Malignant neoplasm of temporal lobe: Secondary | ICD-10-CM | POA: Insufficient documentation

## 2021-04-30 DIAGNOSIS — I10 Essential (primary) hypertension: Secondary | ICD-10-CM | POA: Insufficient documentation

## 2021-04-30 DIAGNOSIS — R911 Solitary pulmonary nodule: Secondary | ICD-10-CM | POA: Insufficient documentation

## 2021-04-30 DIAGNOSIS — F17210 Nicotine dependence, cigarettes, uncomplicated: Secondary | ICD-10-CM | POA: Insufficient documentation

## 2021-04-30 LAB — CMP (CANCER CENTER ONLY)
ALT: 19 U/L (ref 0–44)
AST: 16 U/L (ref 15–41)
Albumin: 3.7 g/dL (ref 3.5–5.0)
Alkaline Phosphatase: 80 U/L (ref 38–126)
Anion gap: 6 (ref 5–15)
BUN: 5 mg/dL — ABNORMAL LOW (ref 6–20)
CO2: 29 mmol/L (ref 22–32)
Calcium: 9 mg/dL (ref 8.9–10.3)
Chloride: 108 mmol/L (ref 98–111)
Creatinine: 0.86 mg/dL (ref 0.44–1.00)
GFR, Estimated: >60 mL/min (ref >60)
Glucose, Bld: 97 mg/dL (ref 70–99)
Potassium: 3.8 mmol/L (ref 3.5–5.1)
Sodium: 143 mmol/L (ref 135–145)
Total Bilirubin: 0.5 mg/dL (ref 0.3–1.2)
Total Protein: 6.2 g/dL — ABNORMAL LOW (ref 6.5–8.1)

## 2021-04-30 LAB — CBC WITH DIFFERENTIAL (CANCER CENTER ONLY)
Abs Immature Granulocytes: 0.01 10*3/uL (ref 0.00–0.07)
Basophils Absolute: 0 10*3/uL (ref 0.0–0.1)
Basophils Relative: 1 %
Eosinophils Absolute: 0.2 10*3/uL (ref 0.0–0.5)
Eosinophils Relative: 5 %
HCT: 40.6 % (ref 36.0–46.0)
Hemoglobin: 13.9 g/dL (ref 12.0–15.0)
Immature Granulocytes: 0 %
Lymphocytes Relative: 19 %
Lymphs Abs: 0.7 10*3/uL (ref 0.7–4.0)
MCH: 31.5 pg (ref 26.0–34.0)
MCHC: 34.2 g/dL (ref 30.0–36.0)
MCV: 92.1 fL (ref 80.0–100.0)
Monocytes Absolute: 0.3 10*3/uL (ref 0.1–1.0)
Monocytes Relative: 8 %
Neutro Abs: 2.7 10*3/uL (ref 1.7–7.7)
Neutrophils Relative %: 67 %
Platelet Count: 191 10*3/uL (ref 150–400)
RBC: 4.41 MIL/uL (ref 3.87–5.11)
RDW: 13.3 % (ref 11.5–15.5)
WBC Count: 4 10*3/uL (ref 4.0–10.5)
nRBC: 0 % (ref 0.0–0.2)

## 2021-04-30 MED ORDER — PROMETHAZINE HCL 25 MG PO TABS: 25.0000 mg | ORAL_TABLET | ORAL | 0 refills | Status: DC | PRN
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
## 2021-05-01 ENCOUNTER — Other Ambulatory Visit: Payer: Self-pay

## 2021-05-01 ENCOUNTER — Ambulatory Visit
Admission: RE | Admit: 2021-05-01 | Discharge: 2021-05-01 | Disposition: A | Discharge status: Home / Self Care | Payer: No Typology Code available for payment source | Source: Ambulatory Visit | Attending: Radiation Oncology | Admitting: Radiation Oncology

## 2021-05-01 DIAGNOSIS — C712 Malignant neoplasm of temporal lobe: Secondary | ICD-10-CM | POA: Diagnosis not present

## 2021-05-02 ENCOUNTER — Other Ambulatory Visit: Payer: Self-pay | Admitting: Internal Medicine

## 2021-05-02 ENCOUNTER — Ambulatory Visit
Admission: RE | Admit: 2021-05-02 | Discharge: 2021-05-02 | Disposition: A | Discharge status: Home / Self Care | Payer: No Typology Code available for payment source | Source: Ambulatory Visit | Attending: Radiation Oncology | Admitting: Radiation Oncology

## 2021-05-02 DIAGNOSIS — C719 Malignant neoplasm of brain, unspecified: Secondary | ICD-10-CM

## 2021-05-02 DIAGNOSIS — C712 Malignant neoplasm of temporal lobe: Secondary | ICD-10-CM | POA: Diagnosis not present

## 2021-05-02 NOTE — Telephone Encounter (Signed)
Patient will have a 1 month follow up with provider after MRI scan and new Rx will be written at that time for 5 days on /23 off at a higher dose. ?

## 2021-05-03 ENCOUNTER — Other Ambulatory Visit: Payer: Self-pay

## 2021-05-03 ENCOUNTER — Ambulatory Visit
Admission: RE | Admit: 2021-05-03 | Discharge: 2021-05-03 | Disposition: A | Discharge status: Home / Self Care | Payer: No Typology Code available for payment source | Source: Ambulatory Visit | Attending: Radiation Oncology | Admitting: Radiation Oncology

## 2021-05-03 DIAGNOSIS — C712 Malignant neoplasm of temporal lobe: Secondary | ICD-10-CM | POA: Diagnosis not present

## 2021-05-06 ENCOUNTER — Ambulatory Visit
Admission: RE | Admit: 2021-05-06 | Discharge: 2021-05-06 | Disposition: A | Discharge status: Home / Self Care | Payer: No Typology Code available for payment source | Source: Ambulatory Visit | Attending: Radiation Oncology | Admitting: Radiation Oncology

## 2021-05-06 ENCOUNTER — Other Ambulatory Visit: Payer: Self-pay

## 2021-05-06 DIAGNOSIS — C712 Malignant neoplasm of temporal lobe: Secondary | ICD-10-CM | POA: Diagnosis not present

## 2021-05-07 ENCOUNTER — Other Ambulatory Visit: Payer: Self-pay

## 2021-05-07 ENCOUNTER — Ambulatory Visit
Admission: RE | Admit: 2021-05-07 | Discharge: 2021-05-07 | Disposition: A | Discharge status: Home / Self Care | Payer: No Typology Code available for payment source | Source: Ambulatory Visit | Attending: Radiation Oncology | Admitting: Radiation Oncology

## 2021-05-07 DIAGNOSIS — C712 Malignant neoplasm of temporal lobe: Secondary | ICD-10-CM | POA: Diagnosis not present

## 2021-05-07 LAB — RAD ONC ARIA SESSION SUMMARY
Course Elapsed Days: 34
Plan Fractions Treated to Date: 2
Plan Prescribed Dose Per Fraction: 2 Gy
Plan Total Fractions Prescribed: 7
Plan Total Prescribed Dose: 14 Gy
Reference Point Dosage Given to Date: 50 Gy
Reference Point Session Dosage Given: 2 Gy
Session Number: 25

## 2021-05-08 ENCOUNTER — Other Ambulatory Visit: Payer: Self-pay

## 2021-05-08 ENCOUNTER — Ambulatory Visit: Payer: No Typology Code available for payment source

## 2021-05-09 ENCOUNTER — Ambulatory Visit
Admission: RE | Admit: 2021-05-09 | Discharge: 2021-05-09 | Disposition: A | Discharge status: Home / Self Care | Payer: No Typology Code available for payment source | Source: Ambulatory Visit | Attending: Radiation Oncology | Admitting: Radiation Oncology

## 2021-05-09 ENCOUNTER — Other Ambulatory Visit: Payer: Self-pay

## 2021-05-09 DIAGNOSIS — C712 Malignant neoplasm of temporal lobe: Secondary | ICD-10-CM | POA: Diagnosis not present

## 2021-05-09 LAB — RAD ONC ARIA SESSION SUMMARY
Course Elapsed Days: 36
Plan Fractions Treated to Date: 3
Plan Prescribed Dose Per Fraction: 2 Gy
Plan Total Fractions Prescribed: 7
Plan Total Prescribed Dose: 14 Gy
Reference Point Dosage Given to Date: 52 Gy
Reference Point Session Dosage Given: 2 Gy
Session Number: 26

## 2021-05-10 ENCOUNTER — Ambulatory Visit
Admission: RE | Admit: 2021-05-10 | Discharge: 2021-05-10 | Disposition: A | Discharge status: Home / Self Care | Payer: No Typology Code available for payment source | Source: Ambulatory Visit | Attending: Radiation Oncology | Admitting: Radiation Oncology

## 2021-05-10 ENCOUNTER — Other Ambulatory Visit: Payer: Self-pay

## 2021-05-10 DIAGNOSIS — R911 Solitary pulmonary nodule: Secondary | ICD-10-CM

## 2021-05-10 DIAGNOSIS — C712 Malignant neoplasm of temporal lobe: Secondary | ICD-10-CM | POA: Diagnosis not present

## 2021-05-10 LAB — RAD ONC ARIA SESSION SUMMARY
Course Elapsed Days: 37
Plan Fractions Treated to Date: 4
Plan Prescribed Dose Per Fraction: 2 Gy
Plan Total Fractions Prescribed: 7
Plan Total Prescribed Dose: 14 Gy
Reference Point Dosage Given to Date: 54 Gy
Reference Point Session Dosage Given: 2 Gy
Session Number: 27

## 2021-05-10 MED ORDER — SONAFINE EX EMUL
1.0000 "application " | Freq: Once | CUTANEOUS | Status: AC
  Administered 2021-05-10: 1 via TOPICAL

## 2021-05-13 ENCOUNTER — Other Ambulatory Visit: Payer: Self-pay

## 2021-05-13 ENCOUNTER — Ambulatory Visit
Admission: RE | Admit: 2021-05-13 | Discharge: 2021-05-13 | Disposition: A | Discharge status: Home / Self Care | Payer: No Typology Code available for payment source | Source: Ambulatory Visit | Attending: Radiation Oncology | Admitting: Radiation Oncology

## 2021-05-13 DIAGNOSIS — C712 Malignant neoplasm of temporal lobe: Secondary | ICD-10-CM | POA: Diagnosis not present

## 2021-05-13 LAB — RAD ONC ARIA SESSION SUMMARY
Course Elapsed Days: 40
Plan Fractions Treated to Date: 5
Plan Prescribed Dose Per Fraction: 2 Gy
Plan Total Fractions Prescribed: 7
Plan Total Prescribed Dose: 14 Gy
Reference Point Dosage Given to Date: 56 Gy
Reference Point Session Dosage Given: 2 Gy
Session Number: 28

## 2021-05-14 ENCOUNTER — Inpatient Hospital Stay: Payer: No Typology Code available for payment source

## 2021-05-14 ENCOUNTER — Inpatient Hospital Stay (HOSPITAL_BASED_OUTPATIENT_CLINIC_OR_DEPARTMENT_OTHER): Payer: No Typology Code available for payment source | Admitting: Internal Medicine

## 2021-05-14 ENCOUNTER — Ambulatory Visit
Admission: RE | Admit: 2021-05-14 | Discharge: 2021-05-14 | Disposition: A | Discharge status: Home / Self Care | Payer: No Typology Code available for payment source | Source: Ambulatory Visit | Attending: Radiation Oncology | Admitting: Radiation Oncology

## 2021-05-14 ENCOUNTER — Other Ambulatory Visit: Payer: Self-pay

## 2021-05-14 VITALS — BP 144/96 | HR 95 | Temp 97.7°F | Resp 17 | Wt 194.9 lb

## 2021-05-14 DIAGNOSIS — C719 Malignant neoplasm of brain, unspecified: Secondary | ICD-10-CM

## 2021-05-14 DIAGNOSIS — C712 Malignant neoplasm of temporal lobe: Secondary | ICD-10-CM | POA: Diagnosis not present

## 2021-05-14 LAB — CMP (CANCER CENTER ONLY)
ALT: 20 U/L (ref 0–44)
AST: 17 U/L (ref 15–41)
Albumin: 4.1 g/dL (ref 3.5–5.0)
Alkaline Phosphatase: 102 U/L (ref 38–126)
Anion gap: 8 (ref 5–15)
BUN: 7 mg/dL (ref 6–20)
CO2: 26 mmol/L (ref 22–32)
Calcium: 9.4 mg/dL (ref 8.9–10.3)
Chloride: 107 mmol/L (ref 98–111)
Creatinine: 0.92 mg/dL (ref 0.44–1.00)
GFR, Estimated: >60 mL/min (ref >60)
Glucose, Bld: 108 mg/dL — ABNORMAL HIGH (ref 70–99)
Potassium: 4.2 mmol/L (ref 3.5–5.1)
Sodium: 141 mmol/L (ref 135–145)
Total Bilirubin: 0.5 mg/dL (ref 0.3–1.2)
Total Protein: 6.8 g/dL (ref 6.5–8.1)

## 2021-05-14 LAB — CBC WITH DIFFERENTIAL (CANCER CENTER ONLY)
Abs Immature Granulocytes: 0.01 10*3/uL (ref 0.00–0.07)
Basophils Absolute: 0 10*3/uL (ref 0.0–0.1)
Basophils Relative: 1 %
Eosinophils Absolute: 0.1 10*3/uL (ref 0.0–0.5)
Eosinophils Relative: 3 %
HCT: 43 % (ref 36.0–46.0)
Hemoglobin: 14.7 g/dL (ref 12.0–15.0)
Immature Granulocytes: 0 %
Lymphocytes Relative: 12 %
Lymphs Abs: 0.5 10*3/uL — ABNORMAL LOW (ref 0.7–4.0)
MCH: 31.6 pg (ref 26.0–34.0)
MCHC: 34.2 g/dL (ref 30.0–36.0)
MCV: 92.5 fL (ref 80.0–100.0)
Monocytes Absolute: 0.3 10*3/uL (ref 0.1–1.0)
Monocytes Relative: 6 %
Neutro Abs: 3 10*3/uL (ref 1.7–7.7)
Neutrophils Relative %: 78 %
Platelet Count: 210 10*3/uL (ref 150–400)
RBC: 4.65 MIL/uL (ref 3.87–5.11)
RDW: 13.1 % (ref 11.5–15.5)
WBC Count: 3.9 10*3/uL — ABNORMAL LOW (ref 4.0–10.5)
nRBC: 0 % (ref 0.0–0.2)

## 2021-05-14 LAB — RAD ONC ARIA SESSION SUMMARY
Course Elapsed Days: 41
Plan Fractions Treated to Date: 6
Plan Prescribed Dose Per Fraction: 2 Gy
Plan Total Fractions Prescribed: 7
Plan Total Prescribed Dose: 14 Gy
Reference Point Dosage Given to Date: 58 Gy
Reference Point Session Dosage Given: 2 Gy
Session Number: 29

## 2021-05-14 LAB — LIPID PANEL
Cholesterol: 146 mg/dL (ref 0–200)
HDL: 52 mg/dL (ref >40)
LDL Cholesterol: 75 mg/dL (ref 0–99)
Total CHOL/HDL Ratio: 2.8 RATIO
Triglycerides: 97 mg/dL (ref <150)
VLDL: 19 mg/dL (ref 0–40)
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
## 2021-05-15 ENCOUNTER — Encounter: Payer: Self-pay | Admitting: Radiation Oncology

## 2021-05-15 ENCOUNTER — Ambulatory Visit
Admission: RE | Admit: 2021-05-15 | Discharge: 2021-05-15 | Disposition: A | Discharge status: Home / Self Care | Payer: No Typology Code available for payment source | Source: Ambulatory Visit | Attending: Radiation Oncology | Admitting: Radiation Oncology

## 2021-05-15 ENCOUNTER — Other Ambulatory Visit: Payer: Self-pay

## 2021-05-15 DIAGNOSIS — C712 Malignant neoplasm of temporal lobe: Secondary | ICD-10-CM | POA: Diagnosis not present

## 2021-05-15 LAB — RAD ONC ARIA SESSION SUMMARY
Course Elapsed Days: 42
Plan Fractions Treated to Date: 7
Plan Prescribed Dose Per Fraction: 2 Gy
Plan Total Fractions Prescribed: 7
Plan Total Prescribed Dose: 14 Gy
Reference Point Dosage Given to Date: 60 Gy
Reference Point Session Dosage Given: 2 Gy
Session Number: 30

## 2021-06-03 ENCOUNTER — Other Ambulatory Visit: Payer: Self-pay | Admitting: Radiation Therapy

## 2021-06-06 ENCOUNTER — Encounter: Payer: Self-pay | Admitting: Internal Medicine

## 2021-06-06 NOTE — Progress Notes (Signed)
                                                                                                                                                             Patient Name: Kelly [name] MRN: [id number] DOB: 1965/06/14 Referring Physician: Arnoldo Morale JEFFREY (Profile Not Attached) Date of Service: 05/15/2021 [facility], [place]                                                        End Of Treatment Note  Diagnoses: C71.2-Malignant neoplasm of temporal lobe  Cancer Staging: Glioblastoma of the right temporal lobe  Intent: Curative  Radiation Treatment Dates: 04/03/2021 through 05/15/2021 Site Technique Total Dose (Gy) Dose per Fx (Gy) Completed Fx Beam Energies  Brain: Brain IMRT 46/46 2 23/23 6X  Brain: Brain_Bst IMRT 14/14 2 7/7 6X   Narrative: The patient tolerated radiation therapy relatively well. She developed some nausea, fatigue and skin changes in the field of therapy during her course.  Plan: The patient will receive a call in about one month from the radiation oncology department. She will continue follow up with Dr. Mickeal Skinner as well.  ________________________________________________    Carola Rhine, [facility]

## 2021-06-07 ENCOUNTER — Encounter (HOSPITAL_COMMUNITY): Payer: Self-pay

## 2021-06-07 ENCOUNTER — Ambulatory Visit (HOSPITAL_COMMUNITY)
Admission: RE | Admit: 2021-06-07 | Discharge: 2021-06-07 | Disposition: A | Discharge status: Home / Self Care | Payer: No Typology Code available for payment source | Source: Ambulatory Visit | Attending: Internal Medicine | Admitting: Internal Medicine

## 2021-06-07 DIAGNOSIS — C719 Malignant neoplasm of brain, unspecified: Secondary | ICD-10-CM | POA: Diagnosis not present

## 2021-06-07 IMAGING — MR MR HEAD WO/W CM
14 series · 48 of 48 positions shown · IV contrast (gadavist)
Comparison: [DATE]

CLINICAL DATA: Glioblastoma, IDH wild-type, standard therapy

EXAM:
MRI HEAD WITHOUT AND WITH CONTRAST
TECHNIQUE: Multiplanar, multiecho pulse sequences of the brain and surrounding
structures were obtained without and with intravenous contrast.
CONTRAST:  9mL GADAVIST GADOBUTROL 1 MMOL/ML IV SOLN

[Series 5: DWI · axial · 3.0mm · 1.36mm/px · z∈[-33,+107]mm · 4 of 96 slices shown (1 of 2)]
[im 1/96]
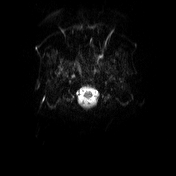
[im 32/96]
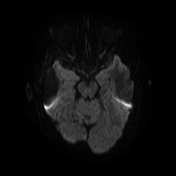
[im 64/96]
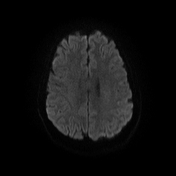
[im 96/96]
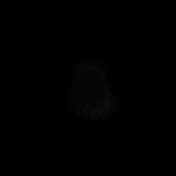

[Series 6: DWI · axial · 3.0mm · 1.36mm/px · z∈[-33,+107]mm · 3 of 48 slices shown (2 of 2)]
[im 1/48]
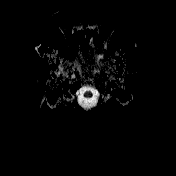
[im 24/48]
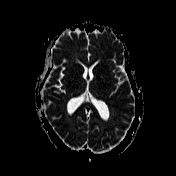
[im 48/48]
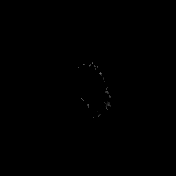

[Series 7: T1 · sagittal · 5.0mm · 0.75mm/px · 1 of 24 slices shown (1 of 2)]
[im 1/24]
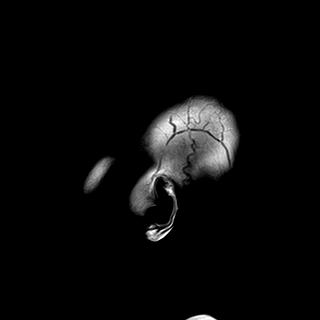

[Series 8: T2 · axial · 5.0mm · 0.62mm/px · 1 of 26 slices shown]
[im 1/26]
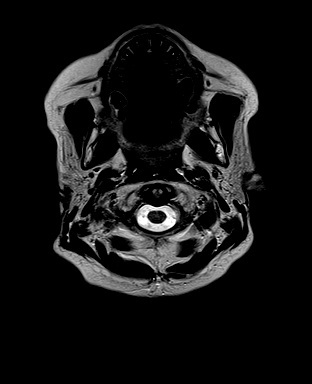

[Series 9: swi_images · axial · 3.0mm · 0.75mm/px · z∈[-71,+141]mm · 4 of 72 slices shown]
[im 1/72]
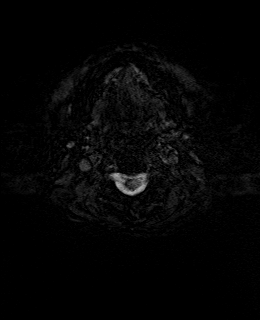
[im 24/72]
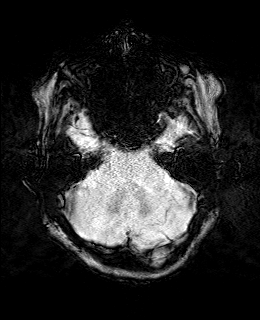
[im 48/72]
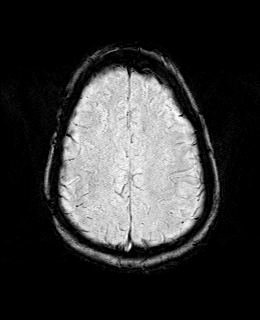
[im 72/72]
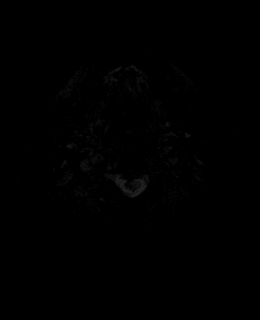

[Series 10: mip_images(sw) · axial · 24.0mm · 0.75mm/px · z∈[-60,+131]mm · 4 of 65 slices shown]
[im 1/65]
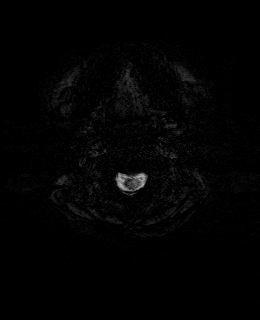
[im 22/65]
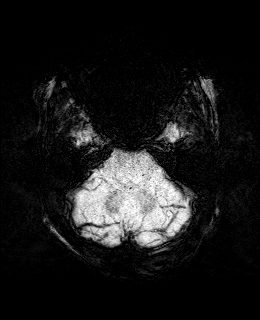
[im 43/65]
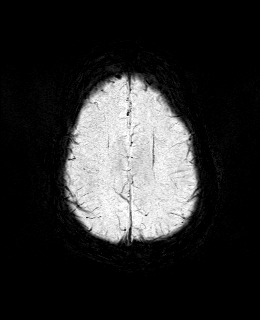
[im 65/65]
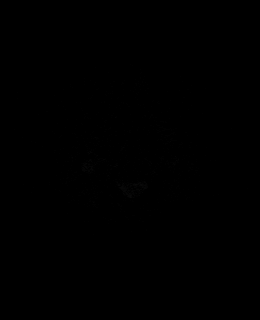

[Series 11: FLAIR · axial · 3.0mm · 0.75mm/px · z∈[-41,+111]mm · 3 of 52 slices shown]
[im 1/52]
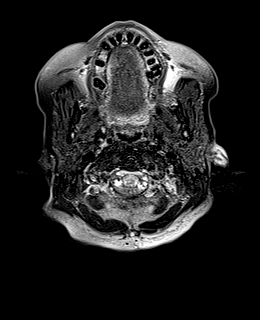
[im 26/52]
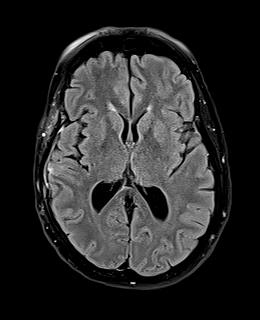
[im 52/52]
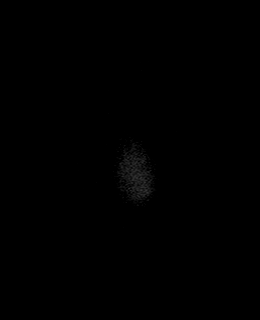

[Series 12: T1 · axial · 1.0mm · 0.94mm/px · z∈[-47,+112]mm · 9 of 160 slices shown (2 of 2)]
[im 1/160]
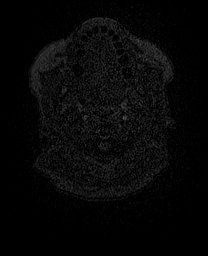
[im 20/160]
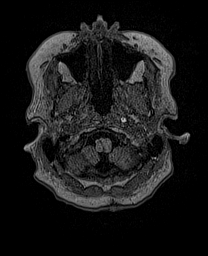
[im 40/160]
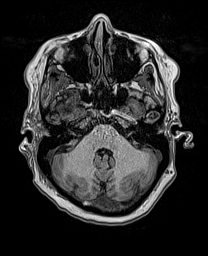
[im 60/160]
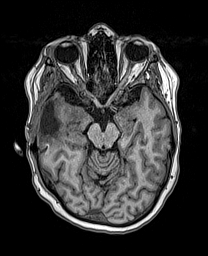
[im 80/160]
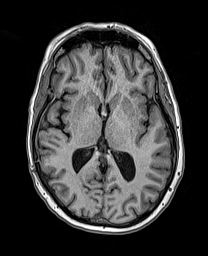
[im 100/160]
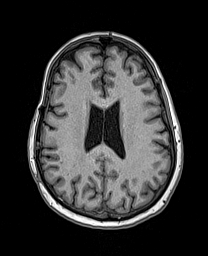
[im 120/160]
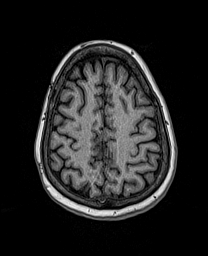
[im 140/160]
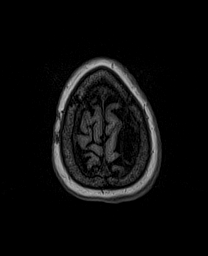
[im 160/160]
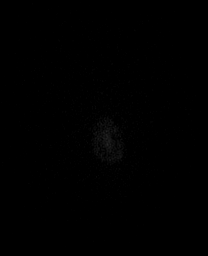

[Series 13: cor dwi_tracew · coronal · 5.0mm · 1.53mm/px · 3 of 56 slices shown]
[im 1/56]
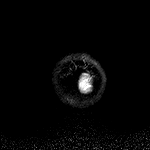
[im 28/56]
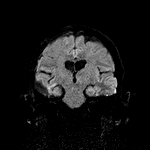
[im 56/56]
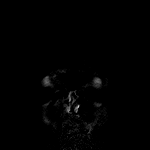

[Series 14: cor dwi_adc · coronal · 5.0mm · 1.53mm/px · 2 of 28 slices shown]
[im 1/28]
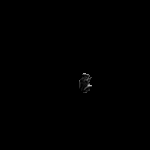
[im 28/28]
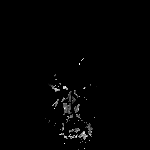

[Series 15: T2 post-contrast · coronal · 5.0mm · 0.57mm/px · 2 of 32 slices shown]
[im 1/32]
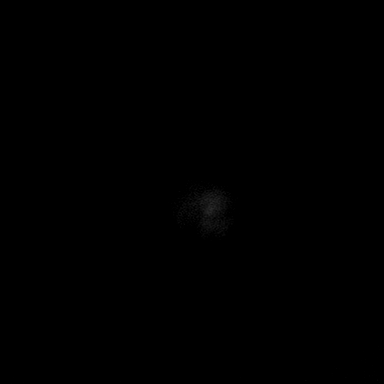
[im 32/32]
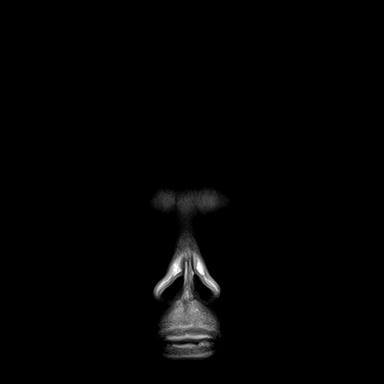

[Series 16: T1 post-contrast · axial · 1.0mm · 0.94mm/px · z∈[-47,+112]mm · 9 of 160 slices shown (1 of 3)]
[im 1/160]
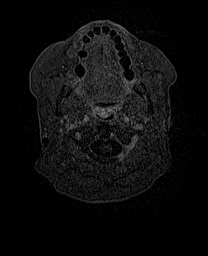
[im 20/160]
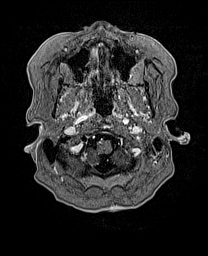
[im 40/160]
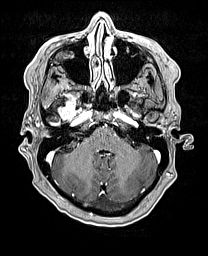
[im 60/160]
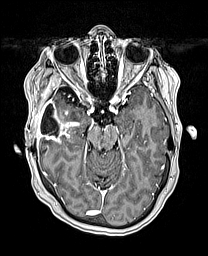
[im 80/160]
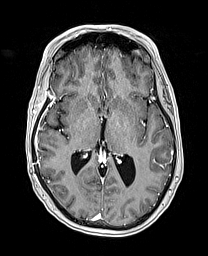
[im 100/160]
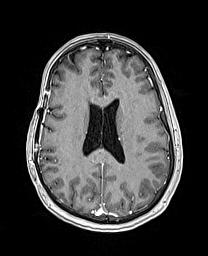
[im 120/160]
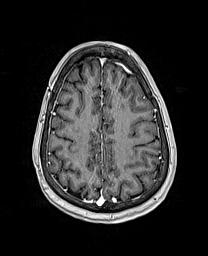
[im 140/160]
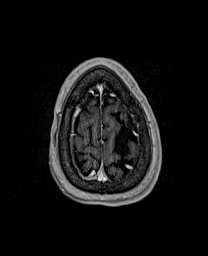
[im 160/160]
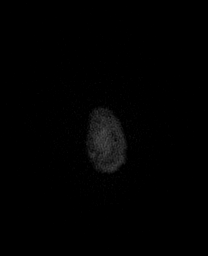

[Series 17: T1 post-contrast · coronal · 5.0mm · 0.43mm/px · 2 of 32 slices shown (2 of 3)]
[im 1/32]
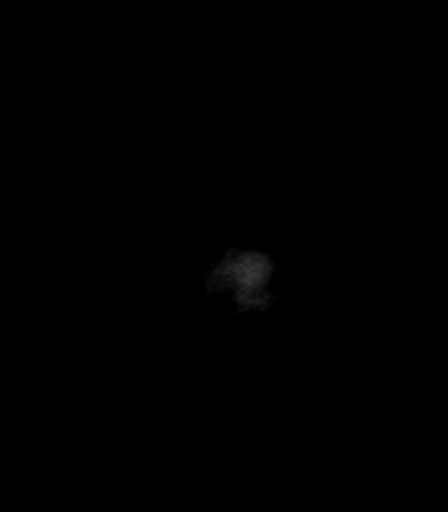
[im 32/32]
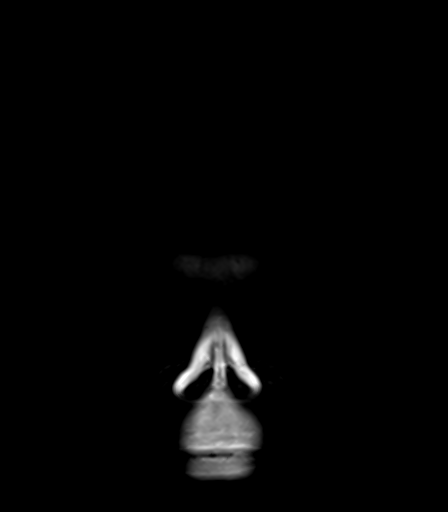

[Series 18: T1 post-contrast · sagittal · 5.0mm · 0.75mm/px · 1 of 24 slices shown (3 of 3)]
[im 1/24]
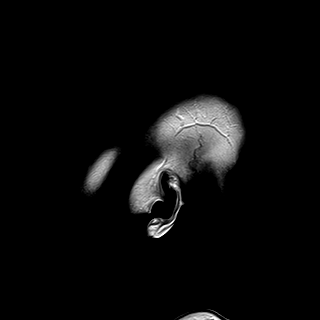

[48 of 48 positions shown; findings below may reference images not displayed]

FINDINGS: Brain: Expected evolution of immediate postoperative changes with
resolution of mass effect. Surgical cavity is present in the right
temporal lobe. Increased enhancement is present at the periphery
with irregularity along the deep margin. Extent of surrounding T2
FLAIR hyperintensity is decreased.

No acute infarction. Chronic blood products at the operative site.
No hydrocephalus. Trace extra-axial collection underlying
craniotomy.

Vascular: Major vessel flow voids at the skull base are preserved.

Skull and upper cervical spine: Normal marrow signal is preserved.

Sinuses/Orbits: Paranasal sinuses are aerated. Orbits are
unremarkable.

Other: Sella is unremarkable.  Mastoid air cells are clear.
IMPRESSION: Increased enhancement at the margins of the resection cavity likely
related to radiation therapy. Decreased extent of T2 FLAIR
hyperintensity.

## 2021-06-07 MED ORDER — GADOBUTROL 1 MMOL/ML IV SOLN
9.0000 mL | Freq: Once | INTRAVENOUS | Status: AC | PRN
  Administered 2021-06-07: 9 mL via INTRAVENOUS

## 2021-06-10 ENCOUNTER — Other Ambulatory Visit: Payer: Self-pay

## 2021-06-10 ENCOUNTER — Inpatient Hospital Stay (HOSPITAL_BASED_OUTPATIENT_CLINIC_OR_DEPARTMENT_OTHER): Payer: No Typology Code available for payment source | Admitting: Internal Medicine

## 2021-06-10 ENCOUNTER — Telehealth: Payer: Self-pay | Admitting: Pharmacist

## 2021-06-10 ENCOUNTER — Inpatient Hospital Stay: Payer: No Typology Code available for payment source

## 2021-06-10 ENCOUNTER — Inpatient Hospital Stay: Payer: No Typology Code available for payment source | Attending: Neurosurgery

## 2021-06-10 VITALS — BP 128/74 | HR 76 | Temp 97.5°F | Resp 18 | Wt 192.6 lb

## 2021-06-10 DIAGNOSIS — I10 Essential (primary) hypertension: Secondary | ICD-10-CM | POA: Diagnosis not present

## 2021-06-10 DIAGNOSIS — R112 Nausea with vomiting, unspecified: Secondary | ICD-10-CM | POA: Diagnosis not present

## 2021-06-10 DIAGNOSIS — R569 Unspecified convulsions: Secondary | ICD-10-CM | POA: Insufficient documentation

## 2021-06-10 DIAGNOSIS — C719 Malignant neoplasm of brain, unspecified: Secondary | ICD-10-CM

## 2021-06-10 DIAGNOSIS — C712 Malignant neoplasm of temporal lobe: Secondary | ICD-10-CM | POA: Insufficient documentation

## 2021-06-10 DIAGNOSIS — F17210 Nicotine dependence, cigarettes, uncomplicated: Secondary | ICD-10-CM | POA: Diagnosis not present

## 2021-06-10 LAB — CBC WITH DIFFERENTIAL (CANCER CENTER ONLY)
Abs Immature Granulocytes: 0.01 10*3/uL (ref 0.00–0.07)
Basophils Absolute: 0 10*3/uL (ref 0.0–0.1)
Basophils Relative: 1 %
Eosinophils Absolute: 0.1 10*3/uL (ref 0.0–0.5)
Eosinophils Relative: 1 %
HCT: 39.7 % (ref 36.0–46.0)
Hemoglobin: 13.7 g/dL (ref 12.0–15.0)
Immature Granulocytes: 0 %
Lymphocytes Relative: 23 %
Lymphs Abs: 0.9 10*3/uL (ref 0.7–4.0)
MCH: 31.4 pg (ref 26.0–34.0)
MCHC: 34.5 g/dL (ref 30.0–36.0)
MCV: 91.1 fL (ref 80.0–100.0)
Monocytes Absolute: 0.3 10*3/uL (ref 0.1–1.0)
Monocytes Relative: 7 %
Neutro Abs: 2.7 10*3/uL (ref 1.7–7.7)
Neutrophils Relative %: 68 %
Platelet Count: 198 10*3/uL (ref 150–400)
RBC: 4.36 MIL/uL (ref 3.87–5.11)
RDW: 12.4 % (ref 11.5–15.5)
WBC Count: 4 10*3/uL (ref 4.0–10.5)
nRBC: 0 % (ref 0.0–0.2)

## 2021-06-10 LAB — CMP (CANCER CENTER ONLY)
ALT: 19 U/L (ref 0–44)
AST: 16 U/L (ref 15–41)
Albumin: 3.8 g/dL (ref 3.5–5.0)
Alkaline Phosphatase: 100 U/L (ref 38–126)
Anion gap: 7 (ref 5–15)
BUN: 11 mg/dL (ref 6–20)
CO2: 27 mmol/L (ref 22–32)
Calcium: 9.4 mg/dL (ref 8.9–10.3)
Chloride: 106 mmol/L (ref 98–111)
Creatinine: 0.88 mg/dL (ref 0.44–1.00)
GFR, Estimated: >60 mL/min (ref >60)
Glucose, Bld: 136 mg/dL — ABNORMAL HIGH (ref 70–99)
Potassium: 3.8 mmol/L (ref 3.5–5.1)
Sodium: 140 mmol/L (ref 135–145)
Total Bilirubin: 0.5 mg/dL (ref 0.3–1.2)
Total Protein: 6.8 g/dL (ref 6.5–8.1)

## 2021-06-10 MED ORDER — TEMOZOLOMIDE 100 MG PO CAPS: 150.0000 mg/m2/d | ORAL_CAPSULE | Freq: Every day | ORAL | 0 refills | Status: DC

## 2021-06-10 MED ORDER — ONDANSETRON HCL 8 MG PO TABS: 8.0000 mg | ORAL_TABLET | Freq: Two times a day (BID) | ORAL | 1 refills | Status: DC | PRN

## 2021-06-10 MED ORDER — LEVETIRACETAM 500 MG PO TABS: 500.0000 mg | ORAL_TABLET | Freq: Two times a day (BID) | ORAL | 3 refills | Status: DC

## 2021-06-10 NOTE — Telephone Encounter (Signed)
Oral Oncology Pharmacist Encounter  Received new prescription for Temodar (temozolomide) for the maintenance treatment of glioblastoma, planned duration ~6-12 cycles.  CBC w/ Diff and CMP from 06/10/21 assessed, no relevant lab abnormalities noted. Prescription dose and frequency assessed for appropriateness. Appropriate for therapy initiation.   Current medication list in Epic reviewed, no relevant/significant DDIs with Temodar identified.  Evaluated chart and no patient barriers to medication adherence noted.   Patient agreement for treatment documented in MD note on 06/10/21.  Prescription has been e-scribed to [employer] [employer] due to insurance requirements.   Oral Oncology Clinic will continue to follow for insurance authorization, copayment issues, initial counseling and start date.  Leron Croak, PharmD, BCPS Hematology/Oncology Clinical Pharmacist [facility] [phone] 06/10/2021 10:49 AM
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
## 2021-06-10 NOTE — Progress Notes (Signed)
DISCONTINUE ON PATHWAY REGIMEN - Neuro ? ? ?  One cycle, concurrent with RT: ?    Temozolomide  ? ?**Always confirm dose/schedule in your pharmacy ordering system** ? ?REASON: Continuation Of Treatment ?PRIOR TREATMENT: [id number]: Radiation Therapy with Concurrent Temozolomide 75 mg/m2 Daily x 6 Weeks, Followed by Adjuvant Temozolomide ?TREATMENT RESPONSE: Stable Disease (SD) ? ?START ON PATHWAY REGIMEN - Neuro ? ? ?  A cycle is every 28 days: ?    Temozolomide  ?    Temozolomide  ? ?**Always confirm dose/schedule in your pharmacy ordering system** ? ?Patient Characteristics: ?Glioma, Glioblastoma, IDH-wildtype, Newly Diagnosed / Treatment Naive, Good Performance Status and/or Younger Patient, MGMT Promoter Unmethylated/Unknown ?Disease Classification: Glioma ?Disease Classification: Glioblastoma, IDH-wildtype ?Disease Status: Newly Diagnosed / Treatment Naive ?Performance Status: Good Performance Status and/or Younger Patient ?MGMT Promoter Methylation Status: Awaiting Test Results ?Intent of Therapy: ?Non-Curative / Palliative Intent, Discussed with Patient ?

## 2021-06-11 ENCOUNTER — Ambulatory Visit: Payer: No Typology Code available for payment source | Attending: Internal Medicine

## 2021-06-11 ENCOUNTER — Telehealth: Payer: Self-pay | Admitting: Internal Medicine

## 2021-06-11 DIAGNOSIS — C712 Malignant neoplasm of temporal lobe: Secondary | ICD-10-CM | POA: Insufficient documentation

## 2021-06-11 DIAGNOSIS — Z510 Encounter for antineoplastic radiation therapy: Secondary | ICD-10-CM | POA: Insufficient documentation

## 2021-06-11 DIAGNOSIS — I10 Essential (primary) hypertension: Secondary | ICD-10-CM | POA: Insufficient documentation

## 2021-06-11 DIAGNOSIS — F17210 Nicotine dependence, cigarettes, uncomplicated: Secondary | ICD-10-CM | POA: Insufficient documentation

## 2021-06-11 NOTE — Telephone Encounter (Signed)
Per 5/22 los called and spoke to pt about appointment   pt confirmed appointment

## 2021-06-11 NOTE — Telephone Encounter (Signed)
Oral Chemotherapy Pharmacist Encounter  I spoke with patient for overview of: Temodar (temozolomide) for the maintenance treatment of glioblastoma multiforme, planned duration 6-12 months of treatment.  Counseled on administration, dosing, side effects, monitoring, drug-food interactions, safe handling, storage, and disposal.  Patient will take Temodar '100mg'$  capsules, 3 capsules, '300mg'$  total daily dose, by mouth once daily, may take at bedtime and on an empty stomach to decrease nausea and vomiting.  Patient will take Temodar daily for 5 days on, 23 days off, and repeated.  If 1st cycle is well tolerated, patient informed that Temodar dose may be increased to 200 mg/m2 daily for 5 days on, 23 days off, repeated every 28 days for subsequent cycles   Temodar maintenance start date: 06/16/21 PM   Patient will take Zofran '8mg'$  tablet, 1 tablet by mouth 30-60 min prior to Temodar dose to help decrease N/V.   Adverse effects include but are not limited to: nausea, vomiting, anorexia, GI upset, rash, and fatigue. Rare but serious adverse effects of pneumocystis pneumonia and secondary malignancy also discussed.  We discussed strategies to manage constipation if they occur secondary to ondansetron dosing.  PCP prophylaxis will not be initiated at this time, but may be added based on lymphocyte count in the future.  Reviewed importance of keeping a medication schedule and plan for any missed doses. No barriers to medication adherence identified.  Medication reconciliation performed and medication/allergy list updated.  Insurance authorization for Temodar has been obtained. Test claim at the pharmacy revealed copayment $0 for 1st fill of Temodar. This will be delivered to patient's home from [facility] on 06/12/21.  All questions answered.  Ms. Hepp voiced understanding and appreciation.   Medication education handout placed in mail for patient. Patient knows to call the office  with questions or concerns. Oral Chemotherapy Clinic phone number provided to patient.   Leron Croak, PharmD, BCPS Hematology/Oncology Clinical Pharmacist [facility] [phone] 06/11/2021 11:36 AM

## 2021-06-20 ENCOUNTER — Other Ambulatory Visit: Payer: Self-pay | Admitting: Internal Medicine

## 2021-06-20 ENCOUNTER — Telehealth: Payer: Self-pay | Admitting: *Deleted

## 2021-06-20 MED ORDER — LORAZEPAM 1 MG PO TABS: 2.0000 mg | ORAL_TABLET | Freq: Once | ORAL | 0 refills | Status: AC

## 2021-06-20 MED ORDER — LEVETIRACETAM 750 MG PO TABS: 1500.0000 mg | ORAL_TABLET | Freq: Two times a day (BID) | ORAL | 1 refills | Status: DC

## 2021-06-20 NOTE — Telephone Encounter (Signed)
Secure Chat response received from Dr Mickeal Skinner and relayed to patient.   Patient should increase Keppra to 1500 mg BID.  Dose 1000 mg now and tonight start new dose of 1500 mg of Keppra.  She also was prescribed a one time dose of Ativan to help stop breakthrough seizure today.   Communicated this to the patient.  She will get back to [place] with how this helps.

## 2021-06-20 NOTE — Telephone Encounter (Signed)
Patient called this morning.  At last office visit on 06/10/2021 she was put on Keppra 500 mg BID for focal seizures that consisted of odd smell followed by cold sensation and then brief episode of nausea.  She reported that they happened a couple of times a day then.    She is calling today to report that they are increasing and are now about 2-3 times per hour.  She stayed out of work yesterday because of them.  She states she dosed Temodar on Sunday 05/28/203 and is no longer on any steroids.  Routed to provider to review and advise how to  proceed.

## 2021-06-21 ENCOUNTER — Other Ambulatory Visit: Payer: Self-pay | Admitting: Internal Medicine

## 2021-06-21 ENCOUNTER — Telehealth: Payer: Self-pay

## 2021-06-21 MED ORDER — LEVETIRACETAM 750 MG PO TABS: 1500.0000 mg | ORAL_TABLET | Freq: Two times a day (BID) | ORAL | 1 refills | Status: DC

## 2021-06-21 NOTE — Telephone Encounter (Signed)
This nurse spoke with patient who stated that the dose of Ativan that was called in on 6/1 has helped her focal seizures.  She would like to know if provider will call in a prescription for Ativan.  Patient also states that she needs a refill on her Keppra.  This nurse advised patient that MD would be made aware of her request.  No further questions at this time.

## 2021-06-24 ENCOUNTER — Encounter: Payer: Self-pay | Admitting: Internal Medicine

## 2021-07-06 ENCOUNTER — Other Ambulatory Visit: Payer: Self-pay | Admitting: Internal Medicine

## 2021-07-06 DIAGNOSIS — C719 Malignant neoplasm of brain, unspecified: Secondary | ICD-10-CM

## 2021-07-08 NOTE — Telephone Encounter (Signed)
Patient will have labs and MD visit prior to refill.

## 2021-07-09 ENCOUNTER — Telehealth: Payer: Self-pay | Admitting: *Deleted

## 2021-07-09 ENCOUNTER — Other Ambulatory Visit: Payer: Self-pay | Admitting: Internal Medicine

## 2021-07-09 MED ORDER — DEXAMETHASONE 4 MG PO TABS: 4.0000 mg | ORAL_TABLET | Freq: Two times a day (BID) | ORAL | 1 refills | Status: DC

## 2021-07-09 NOTE — Telephone Encounter (Signed)
Patient called to report that she has been taking the Keppra 1500 mg BID for the seizure activity that was reported prior.  (Aura with smell/cold sensation and then nausea).  Reports initially it helped to stop the focal seizures but here recently they have started back since this past week.  Reports they have happened approximately 7-10 times in a day.  Denies any change in stressors.  Denies taking and steroids at this time.  Completed Radiation at end of April.    Pharmacy [facility] [place].

## 2021-07-09 NOTE — Telephone Encounter (Signed)
Per Dr Mickeal Skinner start patient on Decadron 4 mg BID and will discuss further on Monday at appointment.

## 2021-07-15 ENCOUNTER — Inpatient Hospital Stay: Payer: No Typology Code available for payment source | Attending: Neurosurgery

## 2021-07-15 ENCOUNTER — Telehealth: Payer: Self-pay | Admitting: Internal Medicine

## 2021-07-15 ENCOUNTER — Inpatient Hospital Stay (HOSPITAL_BASED_OUTPATIENT_CLINIC_OR_DEPARTMENT_OTHER): Payer: No Typology Code available for payment source | Admitting: Internal Medicine

## 2021-07-15 ENCOUNTER — Other Ambulatory Visit: Payer: Self-pay

## 2021-07-15 VITALS — BP 134/87 | Temp 97.3°F | Resp 20 | Wt 190.9 lb

## 2021-07-15 DIAGNOSIS — C712 Malignant neoplasm of temporal lobe: Secondary | ICD-10-CM | POA: Diagnosis not present

## 2021-07-15 DIAGNOSIS — F17210 Nicotine dependence, cigarettes, uncomplicated: Secondary | ICD-10-CM | POA: Diagnosis not present

## 2021-07-15 DIAGNOSIS — I10 Essential (primary) hypertension: Secondary | ICD-10-CM | POA: Diagnosis not present

## 2021-07-15 DIAGNOSIS — Z79899 Other long term (current) drug therapy: Secondary | ICD-10-CM | POA: Diagnosis not present

## 2021-07-15 DIAGNOSIS — C719 Malignant neoplasm of brain, unspecified: Secondary | ICD-10-CM

## 2021-07-15 DIAGNOSIS — Z7952 Long term (current) use of systemic steroids: Secondary | ICD-10-CM | POA: Diagnosis not present

## 2021-07-15 LAB — CMP (CANCER CENTER ONLY)
ALT: 12 U/L (ref 0–44)
AST: 8 U/L — ABNORMAL LOW (ref 15–41)
Albumin: 3.7 g/dL (ref 3.5–5.0)
Alkaline Phosphatase: 77 U/L (ref 38–126)
Anion gap: 7 (ref 5–15)
BUN: 14 mg/dL (ref 6–20)
CO2: 26 mmol/L (ref 22–32)
Calcium: 9.2 mg/dL (ref 8.9–10.3)
Chloride: 106 mmol/L (ref 98–111)
Creatinine: 0.88 mg/dL (ref 0.44–1.00)
GFR, Estimated: >60 mL/min (ref >60)
Glucose, Bld: 149 mg/dL — ABNORMAL HIGH (ref 70–99)
Potassium: 3.6 mmol/L (ref 3.5–5.1)
Sodium: 139 mmol/L (ref 135–145)
Total Bilirubin: 0.3 mg/dL (ref 0.3–1.2)
Total Protein: 6.1 g/dL — ABNORMAL LOW (ref 6.5–8.1)

## 2021-07-15 LAB — CBC WITH DIFFERENTIAL (CANCER CENTER ONLY)
Abs Immature Granulocytes: 0.04 10*3/uL (ref 0.00–0.07)
Basophils Absolute: 0 10*3/uL (ref 0.0–0.1)
Basophils Relative: 0 %
Eosinophils Absolute: 0 10*3/uL (ref 0.0–0.5)
Eosinophils Relative: 0 %
HCT: 40.3 % (ref 36.0–46.0)
Hemoglobin: 14.2 g/dL (ref 12.0–15.0)
Immature Granulocytes: 1 %
Lymphocytes Relative: 19 %
Lymphs Abs: 1.4 10*3/uL (ref 0.7–4.0)
MCH: 32.1 pg (ref 26.0–34.0)
MCHC: 35.2 g/dL (ref 30.0–36.0)
MCV: 91.2 fL (ref 80.0–100.0)
Monocytes Absolute: 0.4 10*3/uL (ref 0.1–1.0)
Monocytes Relative: 5 %
Neutro Abs: 5.3 10*3/uL (ref 1.7–7.7)
Neutrophils Relative %: 75 %
Platelet Count: 211 10*3/uL (ref 150–400)
RBC: 4.42 MIL/uL (ref 3.87–5.11)
RDW: 12.8 % (ref 11.5–15.5)
WBC Count: 7.1 10*3/uL (ref 4.0–10.5)
nRBC: 0 % (ref 0.0–0.2)

## 2021-07-15 MED ORDER — TEMOZOLOMIDE 100 MG PO CAPS: 200.0000 mg/m2/d | ORAL_CAPSULE | Freq: Every day | ORAL | 0 refills | Status: DC

## 2021-07-15 MED ORDER — DEXAMETHASONE 1 MG PO TABS: ORAL_TABLET | ORAL | 0 refills | Status: DC
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
## 2021-08-05 ENCOUNTER — Other Ambulatory Visit: Payer: Self-pay | Admitting: Internal Medicine

## 2021-08-05 DIAGNOSIS — C719 Malignant neoplasm of brain, unspecified: Secondary | ICD-10-CM

## 2021-08-05 NOTE — Telephone Encounter (Signed)
Patient will have labs done prior to refilling oral chemotherapy.  Appt already scheduled.

## 2021-08-08 ENCOUNTER — Other Ambulatory Visit: Payer: Self-pay | Admitting: Radiation Therapy

## 2021-08-12 ENCOUNTER — Ambulatory Visit (HOSPITAL_COMMUNITY)
Admission: RE | Admit: 2021-08-12 | Discharge: 2021-08-12 | Disposition: A | Discharge status: Home / Self Care | Payer: No Typology Code available for payment source | Source: Ambulatory Visit | Attending: Internal Medicine | Admitting: Internal Medicine

## 2021-08-12 ENCOUNTER — Other Ambulatory Visit: Payer: Self-pay

## 2021-08-12 ENCOUNTER — Telehealth: Payer: Self-pay | Admitting: *Deleted

## 2021-08-12 ENCOUNTER — Other Ambulatory Visit: Payer: Self-pay | Admitting: *Deleted

## 2021-08-12 DIAGNOSIS — C719 Malignant neoplasm of brain, unspecified: Secondary | ICD-10-CM | POA: Diagnosis present

## 2021-08-12 MED ORDER — DEXAMETHASONE 2 MG PO TABS: 2.0000 mg | ORAL_TABLET | Freq: Every day | ORAL | 0 refills | Status: DC

## 2021-08-12 MED ORDER — GADOBUTROL 1 MMOL/ML IV SOLN
8.5000 mL | Freq: Once | INTRAVENOUS | Status: AC | PRN
  Administered 2021-08-12: 8.5 mL via INTRAVENOUS

## 2021-08-12 NOTE — Telephone Encounter (Signed)
Patient called to report a new numbness in the right hip and then it feels like "Rubber".  She has some unsteadiness with her walking.  Reports that is consistent since Friday.    States she woke up with a headache this morning and states she hasn't had one since the surgery.  Reports having a focal seizure this morning (smell, cold chills and then nausea).  Report it latest about a couple of minutes.   Denies having missed any doses of Keppra 1500 BID.   She completed steroid dosing last week and since completing that she reports having about 5 focal seizures.    Does not have Ativan on hand anymore for breakthrough seizures.    She is on schedule to have MRI completed today.   [facility]. Routing to provider.

## 2021-08-12 NOTE — Progress Notes (Signed)
Patient states she is out of Decadron and needs refill due to restart.  Order placed.

## 2021-08-14 ENCOUNTER — Other Ambulatory Visit: Payer: Self-pay

## 2021-08-14 ENCOUNTER — Encounter: Payer: Self-pay | Admitting: Internal Medicine

## 2021-08-15 ENCOUNTER — Other Ambulatory Visit: Payer: Self-pay

## 2021-08-15 ENCOUNTER — Encounter: Payer: Self-pay | Admitting: *Deleted

## 2021-08-19 ENCOUNTER — Inpatient Hospital Stay: Payer: No Typology Code available for payment source

## 2021-08-19 ENCOUNTER — Other Ambulatory Visit: Payer: Self-pay

## 2021-08-19 ENCOUNTER — Inpatient Hospital Stay (HOSPITAL_BASED_OUTPATIENT_CLINIC_OR_DEPARTMENT_OTHER): Payer: No Typology Code available for payment source | Admitting: Internal Medicine

## 2021-08-19 ENCOUNTER — Inpatient Hospital Stay: Payer: No Typology Code available for payment source | Attending: Neurosurgery

## 2021-08-19 VITALS — BP 105/74 | HR 97 | Temp 97.9°F | Resp 17 | Ht 64.0 in | Wt 191.1 lb

## 2021-08-19 DIAGNOSIS — C712 Malignant neoplasm of temporal lobe: Secondary | ICD-10-CM | POA: Diagnosis present

## 2021-08-19 DIAGNOSIS — F17210 Nicotine dependence, cigarettes, uncomplicated: Secondary | ICD-10-CM | POA: Diagnosis not present

## 2021-08-19 DIAGNOSIS — Z79899 Other long term (current) drug therapy: Secondary | ICD-10-CM | POA: Insufficient documentation

## 2021-08-19 DIAGNOSIS — C719 Malignant neoplasm of brain, unspecified: Secondary | ICD-10-CM

## 2021-08-19 DIAGNOSIS — I10 Essential (primary) hypertension: Secondary | ICD-10-CM | POA: Insufficient documentation

## 2021-08-19 DIAGNOSIS — R112 Nausea with vomiting, unspecified: Secondary | ICD-10-CM | POA: Diagnosis not present

## 2021-08-19 LAB — CBC WITH DIFFERENTIAL (CANCER CENTER ONLY)
Abs Immature Granulocytes: 0.02 10*3/uL (ref 0.00–0.07)
Basophils Absolute: 0 10*3/uL (ref 0.0–0.1)
Basophils Relative: 0 %
Eosinophils Absolute: 0.1 10*3/uL (ref 0.0–0.5)
Eosinophils Relative: 1 %
HCT: 38.6 % (ref 36.0–46.0)
Hemoglobin: 13.4 g/dL (ref 12.0–15.0)
Immature Granulocytes: 0 %
Lymphocytes Relative: 23 %
Lymphs Abs: 1.3 10*3/uL (ref 0.7–4.0)
MCH: 33.3 pg (ref 26.0–34.0)
MCHC: 34.7 g/dL (ref 30.0–36.0)
MCV: 96 fL (ref 80.0–100.0)
Monocytes Absolute: 0.3 10*3/uL (ref 0.1–1.0)
Monocytes Relative: 6 %
Neutro Abs: 4 10*3/uL (ref 1.7–7.7)
Neutrophils Relative %: 70 %
Platelet Count: 187 10*3/uL (ref 150–400)
RBC: 4.02 MIL/uL (ref 3.87–5.11)
RDW: 13.8 % (ref 11.5–15.5)
WBC Count: 5.8 10*3/uL (ref 4.0–10.5)
nRBC: 0 % (ref 0.0–0.2)

## 2021-08-19 LAB — CMP (CANCER CENTER ONLY)
ALT: 11 U/L (ref 0–44)
AST: 9 U/L — ABNORMAL LOW (ref 15–41)
Albumin: 3.8 g/dL (ref 3.5–5.0)
Alkaline Phosphatase: 87 U/L (ref 38–126)
Anion gap: 5 (ref 5–15)
BUN: 13 mg/dL (ref 6–20)
CO2: 29 mmol/L (ref 22–32)
Calcium: 8.9 mg/dL (ref 8.9–10.3)
Chloride: 106 mmol/L (ref 98–111)
Creatinine: 0.84 mg/dL (ref 0.44–1.00)
GFR, Estimated: >60 mL/min (ref >60)
Glucose, Bld: 106 mg/dL — ABNORMAL HIGH (ref 70–99)
Potassium: 3.2 mmol/L — ABNORMAL LOW (ref 3.5–5.1)
Sodium: 140 mmol/L (ref 135–145)
Total Bilirubin: 0.3 mg/dL (ref 0.3–1.2)
Total Protein: 6.5 g/dL (ref 6.5–8.1)

## 2021-08-19 MED ORDER — DEXAMETHASONE 2 MG PO TABS: 2.0000 mg | ORAL_TABLET | Freq: Every day | ORAL | 0 refills | Status: DC

## 2021-08-19 MED ORDER — LEVETIRACETAM 750 MG PO TABS: 1500.0000 mg | ORAL_TABLET | Freq: Two times a day (BID) | ORAL | 1 refills | Status: DC

## 2021-08-19 MED ORDER — TEMOZOLOMIDE 100 MG PO CAPS: 200.0000 mg/m2/d | ORAL_CAPSULE | Freq: Every day | ORAL | 0 refills | Status: DC

## 2021-08-19 NOTE — Progress Notes (Signed)
[facility]  Initial Assessment   Kelly [name] is a 56 y.o. year old female contacted by phone. Clinical Social Work was referred by nurse navigator for assessment of psychosocial needs.   SDOH (Social Determinants of Health) assessments performed: Yes SDOH Interventions    Flowsheet Row Most Recent Value  SDOH Interventions   Food Insecurity Interventions Intervention Not Indicated  Housing Interventions Intervention Not Indicated  Transportation Interventions Intervention Not Indicated       SDOH Screenings   Alcohol Screen: Not on file  Depression (VFI4-3): Not on file  Financial Resource Strain: Low Risk  (08/19/2021)   Overall Financial Resource Strain (CARDIA)    Difficulty of Paying Living Expenses: Not very hard  Food Insecurity: No Food Insecurity (08/19/2021)   Hunger Vital Sign    Worried About Running Out of Food in the Last Year: Never true    Ran Out of Food in the Last Year: Never true  Housing: Not on file  Physical Activity: Not on file  Social Connections: Moderately Isolated (08/19/2021)   Social Connection and Isolation Panel [NHANES]    Frequency of Communication with Friends and Family: More than three times a week    Frequency of Social Gatherings with Friends and Family: More than three times a week    Attends Religious Services: Never    Marine scientist or Organizations: No    Attends Archivist Meetings: Never    Marital Status: Married  Stress: Not on file  Tobacco Use: High Risk (03/05/2021)   Patient History    Smoking Tobacco Use: Every Day    Smokeless Tobacco Use: Never    Passive Exposure: Not on file  Transportation Needs: No Transportation Needs (08/19/2021)   PRAPARE - Transportation    Lack of Transportation (Medical): No    Lack of Transportation (Non-Medical): No     Distress Screen completed: No    03/28/2021   12:42 PM  ONCBCN DISTRESS SCREENING  Screening Type Initial Screening  Distress  experienced in past week (1-10) 7  Practical problem type Insurance  Emotional problem type Adjusting to illness      Family/Social Information:  Housing Arrangement: patient lives with her husband. Family members/support persons in your life? Family Transportation concerns: no  Employment: Working full time. Income source: Employment Financial concerns: Yes, due to illness and/or loss of work during treatment Type of concern: Medical bills Food access concerns: no Religious or spiritual practice: No Services Currently in place:  None  Coping/ Adjustment to diagnosis: Patient understands treatment plan and what happens next? 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 Patient enjoys time with family/ friends Current coping skills/ strengths: Capable of independent living , Armed forces logistics/support/administrative officer , General fund of knowledge , and Supportive family/friends     SUMMARY: Current SDOH Barriers:  Financial constraints related to future insurance and Limited social support  Clinical Social Work Clinical Goal(s):  Explore community resource options for unmet needs related to:  Financial Strain   Interventions: Discussed common feeling and emotions when being diagnosed with cancer, and the importance of support during treatment Informed patient of the support team roles and support services at [facility] Provided [facility] contact information and encouraged patient to call with any questions or concerns Referred patient to [employer] for health insurance options.  [email or web address]) and mailed [employer]  Emailed  Marcela a [employer] Release form in anticipation of applying for disability.   Follow Up Plan: CSW will follow-up with patient by phone  Patient verbalizes understanding of plan: Yes    Rodman Pickle  Daejon Lich, LCSW
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
## 2021-08-20 ENCOUNTER — Other Ambulatory Visit: Payer: Self-pay | Admitting: *Deleted

## 2021-08-20 ENCOUNTER — Telehealth: Payer: Self-pay | Admitting: Internal Medicine

## 2021-08-20 ENCOUNTER — Other Ambulatory Visit: Payer: Self-pay

## 2021-08-20 NOTE — Telephone Encounter (Signed)
Scheduled per 7/31 los, pt has been called and confirmed

## 2021-08-21 ENCOUNTER — Other Ambulatory Visit: Payer: Self-pay

## 2021-09-05 ENCOUNTER — Other Ambulatory Visit: Payer: Self-pay | Admitting: Internal Medicine

## 2021-09-09 ENCOUNTER — Other Ambulatory Visit: Payer: Self-pay | Admitting: Internal Medicine

## 2021-09-09 ENCOUNTER — Encounter: Payer: Self-pay | Admitting: Internal Medicine

## 2021-09-09 DIAGNOSIS — C719 Malignant neoplasm of brain, unspecified: Secondary | ICD-10-CM

## 2021-09-09 NOTE — Telephone Encounter (Signed)
Patient will have labs and visit with provider prior to refilling Rx.

## 2021-09-10 ENCOUNTER — Other Ambulatory Visit: Payer: Self-pay | Admitting: Radiation Therapy

## 2021-09-11 ENCOUNTER — Other Ambulatory Visit: Payer: Self-pay

## 2021-09-12 ENCOUNTER — Other Ambulatory Visit: Payer: Self-pay | Admitting: Internal Medicine

## 2021-09-12 MED ORDER — DEXAMETHASONE 2 MG PO TABS: 2.0000 mg | ORAL_TABLET | Freq: Every day | ORAL | 0 refills | Status: DC

## 2021-09-13 ENCOUNTER — Ambulatory Visit (HOSPITAL_COMMUNITY)
Admission: RE | Admit: 2021-09-13 | Discharge: 2021-09-13 | Disposition: A | Discharge status: Home / Self Care | Payer: No Typology Code available for payment source | Source: Ambulatory Visit | Attending: Internal Medicine | Admitting: Internal Medicine

## 2021-09-13 DIAGNOSIS — C719 Malignant neoplasm of brain, unspecified: Secondary | ICD-10-CM | POA: Insufficient documentation

## 2021-09-13 MED ORDER — GADOBUTROL 1 MMOL/ML IV SOLN
8.5000 mL | Freq: Once | INTRAVENOUS | Status: AC | PRN
  Administered 2021-09-13: 8.5 mL via INTRAVENOUS

## 2021-09-16 ENCOUNTER — Inpatient Hospital Stay: Payer: No Typology Code available for payment source | Attending: Neurosurgery

## 2021-09-16 ENCOUNTER — Inpatient Hospital Stay (HOSPITAL_BASED_OUTPATIENT_CLINIC_OR_DEPARTMENT_OTHER): Payer: No Typology Code available for payment source | Admitting: Internal Medicine

## 2021-09-16 ENCOUNTER — Other Ambulatory Visit: Payer: Self-pay

## 2021-09-16 ENCOUNTER — Inpatient Hospital Stay: Payer: No Typology Code available for payment source

## 2021-09-16 VITALS — BP 142/84 | HR 91 | Temp 98.2°F | Resp 18 | Wt 197.9 lb

## 2021-09-16 DIAGNOSIS — C719 Malignant neoplasm of brain, unspecified: Secondary | ICD-10-CM | POA: Diagnosis not present

## 2021-09-16 DIAGNOSIS — C712 Malignant neoplasm of temporal lobe: Secondary | ICD-10-CM | POA: Insufficient documentation

## 2021-09-16 DIAGNOSIS — F17210 Nicotine dependence, cigarettes, uncomplicated: Secondary | ICD-10-CM | POA: Diagnosis not present

## 2021-09-16 DIAGNOSIS — I10 Essential (primary) hypertension: Secondary | ICD-10-CM | POA: Insufficient documentation

## 2021-09-16 DIAGNOSIS — R112 Nausea with vomiting, unspecified: Secondary | ICD-10-CM | POA: Insufficient documentation

## 2021-09-16 DIAGNOSIS — Z79899 Other long term (current) drug therapy: Secondary | ICD-10-CM | POA: Insufficient documentation

## 2021-09-16 LAB — CBC WITH DIFFERENTIAL (CANCER CENTER ONLY)
Abs Immature Granulocytes: 0.03 10*3/uL (ref 0.00–0.07)
Basophils Absolute: 0 10*3/uL (ref 0.0–0.1)
Basophils Relative: 0 %
Eosinophils Absolute: 0.1 10*3/uL (ref 0.0–0.5)
Eosinophils Relative: 1 %
HCT: 38 % (ref 36.0–46.0)
Hemoglobin: 13.2 g/dL (ref 12.0–15.0)
Immature Granulocytes: 0 %
Lymphocytes Relative: 9 %
Lymphs Abs: 0.7 10*3/uL (ref 0.7–4.0)
MCH: 34.4 pg — ABNORMAL HIGH (ref 26.0–34.0)
MCHC: 34.7 g/dL (ref 30.0–36.0)
MCV: 99 fL (ref 80.0–100.0)
Monocytes Absolute: 0.3 10*3/uL (ref 0.1–1.0)
Monocytes Relative: 4 %
Neutro Abs: 6.9 10*3/uL (ref 1.7–7.7)
Neutrophils Relative %: 86 %
Platelet Count: 129 10*3/uL — ABNORMAL LOW (ref 150–400)
RBC: 3.84 MIL/uL — ABNORMAL LOW (ref 3.87–5.11)
RDW: 14.8 % (ref 11.5–15.5)
WBC Count: 8 10*3/uL (ref 4.0–10.5)
nRBC: 0 % (ref 0.0–0.2)

## 2021-09-16 LAB — CMP (CANCER CENTER ONLY)
ALT: 25 U/L (ref 0–44)
AST: 12 U/L — ABNORMAL LOW (ref 15–41)
Albumin: 3.7 g/dL (ref 3.5–5.0)
Alkaline Phosphatase: 74 U/L (ref 38–126)
Anion gap: 8 (ref 5–15)
BUN: 10 mg/dL (ref 6–20)
CO2: 26 mmol/L (ref 22–32)
Calcium: 8.8 mg/dL — ABNORMAL LOW (ref 8.9–10.3)
Chloride: 106 mmol/L (ref 98–111)
Creatinine: 0.65 mg/dL (ref 0.44–1.00)
GFR, Estimated: >60 mL/min (ref >60)
Glucose, Bld: 143 mg/dL — ABNORMAL HIGH (ref 70–99)
Potassium: 3.3 mmol/L — ABNORMAL LOW (ref 3.5–5.1)
Sodium: 140 mmol/L (ref 135–145)
Total Bilirubin: 0.4 mg/dL (ref 0.3–1.2)
Total Protein: 6 g/dL — ABNORMAL LOW (ref 6.5–8.1)

## 2021-09-16 MED ORDER — AMITRIPTYLINE HCL 50 MG PO TABS: 50.0000 mg | ORAL_TABLET | Freq: Every day | ORAL | 1 refills | Status: DC

## 2021-09-16 MED ORDER — TEMOZOLOMIDE 100 MG PO CAPS: 200.0000 mg/m2/d | ORAL_CAPSULE | Freq: Every day | ORAL | 0 refills | Status: DC
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
## 2021-09-17 ENCOUNTER — Telehealth: Payer: Self-pay | Admitting: Internal Medicine

## 2021-09-17 NOTE — Telephone Encounter (Signed)
Per 8/28 los called and spoke to pt about appointment

## 2021-09-18 ENCOUNTER — Other Ambulatory Visit: Payer: Self-pay

## 2021-09-20 ENCOUNTER — Other Ambulatory Visit: Payer: Self-pay

## 2021-09-25 ENCOUNTER — Encounter: Payer: Self-pay | Admitting: Internal Medicine

## 2021-10-04 ENCOUNTER — Telehealth: Payer: Self-pay

## 2021-10-04 NOTE — Telephone Encounter (Signed)
T/C from pt stating she has not had any headaches since her surgery in February but last night had a headache beginning behind her rt eye to the top of her head.  She took three 250 mg of ibuprofen and three more again this morning and her pain scale is still at eight.  Asking if it is ok to take the ibuprofen and if you have any recommendations.

## 2021-10-07 ENCOUNTER — Other Ambulatory Visit: Payer: Self-pay | Admitting: Internal Medicine

## 2021-10-07 DIAGNOSIS — C719 Malignant neoplasm of brain, unspecified: Secondary | ICD-10-CM

## 2021-10-07 NOTE — Telephone Encounter (Signed)
Patient will need to have lab and visit with provider first before refilling.

## 2021-10-11 ENCOUNTER — Other Ambulatory Visit: Payer: Self-pay | Admitting: Internal Medicine

## 2021-10-14 ENCOUNTER — Other Ambulatory Visit: Payer: Self-pay

## 2021-10-14 ENCOUNTER — Inpatient Hospital Stay: Payer: No Typology Code available for payment source | Attending: Neurosurgery

## 2021-10-14 ENCOUNTER — Encounter: Payer: Self-pay | Admitting: Internal Medicine

## 2021-10-14 ENCOUNTER — Inpatient Hospital Stay (HOSPITAL_BASED_OUTPATIENT_CLINIC_OR_DEPARTMENT_OTHER): Payer: No Typology Code available for payment source | Admitting: Internal Medicine

## 2021-10-14 VITALS — BP 123/91 | HR 104 | Temp 98.1°F | Resp 17 | Wt 201.5 lb

## 2021-10-14 DIAGNOSIS — C719 Malignant neoplasm of brain, unspecified: Secondary | ICD-10-CM

## 2021-10-14 DIAGNOSIS — R569 Unspecified convulsions: Secondary | ICD-10-CM | POA: Insufficient documentation

## 2021-10-14 DIAGNOSIS — F17210 Nicotine dependence, cigarettes, uncomplicated: Secondary | ICD-10-CM | POA: Diagnosis not present

## 2021-10-14 DIAGNOSIS — C712 Malignant neoplasm of temporal lobe: Secondary | ICD-10-CM | POA: Insufficient documentation

## 2021-10-14 DIAGNOSIS — I10 Essential (primary) hypertension: Secondary | ICD-10-CM | POA: Diagnosis not present

## 2021-10-14 LAB — CBC WITH DIFFERENTIAL (CANCER CENTER ONLY)
Abs Immature Granulocytes: 0.04 10*3/uL (ref 0.00–0.07)
Basophils Absolute: 0 10*3/uL (ref 0.0–0.1)
Basophils Relative: 0 %
Eosinophils Absolute: 0.1 10*3/uL (ref 0.0–0.5)
Eosinophils Relative: 2 %
HCT: 37.1 % (ref 36.0–46.0)
Hemoglobin: 13.1 g/dL (ref 12.0–15.0)
Immature Granulocytes: 1 %
Lymphocytes Relative: 18 %
Lymphs Abs: 1.3 10*3/uL (ref 0.7–4.0)
MCH: 36.2 pg — ABNORMAL HIGH (ref 26.0–34.0)
MCHC: 35.3 g/dL (ref 30.0–36.0)
MCV: 102.5 fL — ABNORMAL HIGH (ref 80.0–100.0)
Monocytes Absolute: 0.6 10*3/uL (ref 0.1–1.0)
Monocytes Relative: 8 %
Neutro Abs: 5 10*3/uL (ref 1.7–7.7)
Neutrophils Relative %: 71 %
Platelet Count: 215 10*3/uL (ref 150–400)
RBC: 3.62 MIL/uL — ABNORMAL LOW (ref 3.87–5.11)
RDW: 14.3 % (ref 11.5–15.5)
WBC Count: 7 10*3/uL (ref 4.0–10.5)
nRBC: 0 % (ref 0.0–0.2)

## 2021-10-14 LAB — CMP (CANCER CENTER ONLY)
ALT: 18 U/L (ref 0–44)
AST: 11 U/L — ABNORMAL LOW (ref 15–41)
Albumin: 3.6 g/dL (ref 3.5–5.0)
Alkaline Phosphatase: 82 U/L (ref 38–126)
Anion gap: 6 (ref 5–15)
BUN: 11 mg/dL (ref 6–20)
CO2: 29 mmol/L (ref 22–32)
Calcium: 8.8 mg/dL — ABNORMAL LOW (ref 8.9–10.3)
Chloride: 105 mmol/L (ref 98–111)
Creatinine: 0.85 mg/dL (ref 0.44–1.00)
GFR, Estimated: >60 mL/min (ref >60)
Glucose, Bld: 102 mg/dL — ABNORMAL HIGH (ref 70–99)
Potassium: 3.6 mmol/L (ref 3.5–5.1)
Sodium: 140 mmol/L (ref 135–145)
Total Bilirubin: 0.4 mg/dL (ref 0.3–1.2)
Total Protein: 6.4 g/dL — ABNORMAL LOW (ref 6.5–8.1)

## 2021-10-14 MED ORDER — TEMOZOLOMIDE 100 MG PO CAPS: 200.0000 mg/m2/d | ORAL_CAPSULE | Freq: Every day | ORAL | 0 refills | Status: DC

## 2021-10-14 MED ORDER — LACOSAMIDE 50 MG PO TABS: 50.0000 mg | ORAL_TABLET | Freq: Two times a day (BID) | ORAL | 3 refills | Status: DC
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
## 2021-10-17 ENCOUNTER — Telehealth: Payer: Self-pay | Admitting: Pharmacist

## 2021-10-17 NOTE — Telephone Encounter (Signed)
Oral Oncology Pharmacist Encounter   Received notification from [employer] that prior authorization for Temozolomide is required.   PA submitted on CoverMyMeds Key: [id number] Status is pending   Oral Oncology Clinic will continue to follow.   Leron Croak, PharmD, BCPS, BCOP Hematology/Oncology Clinical Pharmacist [facility] [place] [phone] 10/17/2021 2:01 PM

## 2021-10-18 ENCOUNTER — Other Ambulatory Visit: Payer: Self-pay

## 2021-10-18 NOTE — Telephone Encounter (Signed)
Oral Oncology Pharmacist Encounter  Prior Authorization for Temozolomide has been approved.    PA# [id number] Effective dates: 10/17/21 through 10/18/22  Patient is required to continue filling with [place]. PA approval information shared with [place].   Leron Croak, PharmD, BCPS, [facility] Hematology/Oncology Clinical Pharmacist [facility] [place] [phone] 10/18/2021 8:26 AM

## 2021-10-21 ENCOUNTER — Other Ambulatory Visit: Payer: Self-pay

## 2021-10-22 ENCOUNTER — Other Ambulatory Visit: Payer: Self-pay | Admitting: Internal Medicine

## 2021-10-22 DIAGNOSIS — C719 Malignant neoplasm of brain, unspecified: Secondary | ICD-10-CM

## 2021-10-24 ENCOUNTER — Encounter: Payer: Self-pay | Admitting: Internal Medicine

## 2021-10-25 ENCOUNTER — Other Ambulatory Visit: Payer: Self-pay | Admitting: Internal Medicine

## 2021-10-25 MED ORDER — FLUCONAZOLE 100 MG PO TABS: 100.0000 mg | ORAL_TABLET | Freq: Every day | ORAL | 0 refills | Status: DC

## 2021-10-28 ENCOUNTER — Ambulatory Visit: Payer: No Typology Code available for payment source | Admitting: Internal Medicine

## 2021-10-28 ENCOUNTER — Other Ambulatory Visit: Payer: No Typology Code available for payment source

## 2021-11-04 ENCOUNTER — Other Ambulatory Visit: Payer: Self-pay | Admitting: Radiation Therapy

## 2021-11-05 ENCOUNTER — Other Ambulatory Visit: Payer: Self-pay | Admitting: Internal Medicine

## 2021-11-06 ENCOUNTER — Other Ambulatory Visit: Payer: Self-pay

## 2021-11-07 ENCOUNTER — Other Ambulatory Visit: Payer: Self-pay | Admitting: Internal Medicine

## 2021-11-08 ENCOUNTER — Ambulatory Visit (HOSPITAL_COMMUNITY)
Admission: RE | Admit: 2021-11-08 | Discharge: 2021-11-08 | Disposition: A | Discharge status: Home / Self Care | Payer: No Typology Code available for payment source | Source: Ambulatory Visit | Attending: Internal Medicine | Admitting: Internal Medicine

## 2021-11-08 DIAGNOSIS — C719 Malignant neoplasm of brain, unspecified: Secondary | ICD-10-CM | POA: Diagnosis present

## 2021-11-08 MED ORDER — GADOBUTROL 1 MMOL/ML IV SOLN
9.0000 mL | Freq: Once | INTRAVENOUS | Status: AC | PRN
  Administered 2021-11-08: 9 mL via INTRAVENOUS

## 2021-11-11 ENCOUNTER — Inpatient Hospital Stay (HOSPITAL_BASED_OUTPATIENT_CLINIC_OR_DEPARTMENT_OTHER): Payer: No Typology Code available for payment source | Admitting: Internal Medicine

## 2021-11-11 ENCOUNTER — Inpatient Hospital Stay: Payer: No Typology Code available for payment source

## 2021-11-11 ENCOUNTER — Inpatient Hospital Stay: Payer: No Typology Code available for payment source | Attending: Neurosurgery

## 2021-11-11 ENCOUNTER — Other Ambulatory Visit: Payer: Self-pay | Admitting: Internal Medicine

## 2021-11-11 ENCOUNTER — Telehealth: Payer: Self-pay | Admitting: Internal Medicine

## 2021-11-11 VITALS — BP 123/75 | HR 123 | Temp 97.7°F | Resp 18 | Ht 64.0 in | Wt 207.6 lb

## 2021-11-11 DIAGNOSIS — Z79899 Other long term (current) drug therapy: Secondary | ICD-10-CM | POA: Insufficient documentation

## 2021-11-11 DIAGNOSIS — C719 Malignant neoplasm of brain, unspecified: Secondary | ICD-10-CM

## 2021-11-11 DIAGNOSIS — R569 Unspecified convulsions: Secondary | ICD-10-CM

## 2021-11-11 DIAGNOSIS — Z923 Personal history of irradiation: Secondary | ICD-10-CM | POA: Diagnosis not present

## 2021-11-11 DIAGNOSIS — I10 Essential (primary) hypertension: Secondary | ICD-10-CM | POA: Insufficient documentation

## 2021-11-11 DIAGNOSIS — C712 Malignant neoplasm of temporal lobe: Secondary | ICD-10-CM | POA: Insufficient documentation

## 2021-11-11 DIAGNOSIS — R112 Nausea with vomiting, unspecified: Secondary | ICD-10-CM | POA: Diagnosis not present

## 2021-11-11 LAB — CBC WITH DIFFERENTIAL (CANCER CENTER ONLY)
Abs Immature Granulocytes: 0.01 10*3/uL (ref 0.00–0.07)
Basophils Absolute: 0 10*3/uL (ref 0.0–0.1)
Basophils Relative: 1 %
Eosinophils Absolute: 0.1 10*3/uL (ref 0.0–0.5)
Eosinophils Relative: 2 %
HCT: 37.1 % (ref 36.0–46.0)
Hemoglobin: 12.8 g/dL (ref 12.0–15.0)
Immature Granulocytes: 0 %
Lymphocytes Relative: 24 %
Lymphs Abs: 1.4 10*3/uL (ref 0.7–4.0)
MCH: 37.1 pg — ABNORMAL HIGH (ref 26.0–34.0)
MCHC: 34.5 g/dL (ref 30.0–36.0)
MCV: 107.5 fL — ABNORMAL HIGH (ref 80.0–100.0)
Monocytes Absolute: 0.3 10*3/uL (ref 0.1–1.0)
Monocytes Relative: 6 %
Neutro Abs: 3.9 10*3/uL (ref 1.7–7.7)
Neutrophils Relative %: 67 %
Platelet Count: 167 10*3/uL (ref 150–400)
RBC: 3.45 MIL/uL — ABNORMAL LOW (ref 3.87–5.11)
RDW: 13.9 % (ref 11.5–15.5)
WBC Count: 5.7 10*3/uL (ref 4.0–10.5)
nRBC: 0 % (ref 0.0–0.2)

## 2021-11-11 LAB — CMP (CANCER CENTER ONLY)
ALT: 19 U/L (ref 0–44)
AST: 13 U/L — ABNORMAL LOW (ref 15–41)
Albumin: 3.6 g/dL (ref 3.5–5.0)
Alkaline Phosphatase: 88 U/L (ref 38–126)
Anion gap: 7 (ref 5–15)
BUN: 9 mg/dL (ref 6–20)
CO2: 31 mmol/L (ref 22–32)
Calcium: 9.2 mg/dL (ref 8.9–10.3)
Chloride: 104 mmol/L (ref 98–111)
Creatinine: 0.75 mg/dL (ref 0.44–1.00)
GFR, Estimated: >60 mL/min (ref >60)
Glucose, Bld: 112 mg/dL — ABNORMAL HIGH (ref 70–99)
Potassium: 3.5 mmol/L (ref 3.5–5.1)
Sodium: 142 mmol/L (ref 135–145)
Total Bilirubin: 0.3 mg/dL (ref 0.3–1.2)
Total Protein: 6.3 g/dL — ABNORMAL LOW (ref 6.5–8.1)

## 2021-11-11 NOTE — Telephone Encounter (Signed)
Per 10/23 los called and left message for pt about appointment
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
## 2021-11-12 ENCOUNTER — Other Ambulatory Visit: Payer: Self-pay

## 2021-11-21 ENCOUNTER — Other Ambulatory Visit: Payer: Self-pay | Admitting: Internal Medicine

## 2021-11-21 DIAGNOSIS — C719 Malignant neoplasm of brain, unspecified: Secondary | ICD-10-CM

## 2021-11-21 NOTE — Telephone Encounter (Signed)
Patient will see provider and have labs done prior to refilling this oral chemotherapy  Also this medication was just ordered 1 week ago for a 30 day cycle.

## 2021-11-22 ENCOUNTER — Other Ambulatory Visit: Payer: Self-pay | Admitting: Internal Medicine

## 2021-11-22 DIAGNOSIS — C719 Malignant neoplasm of brain, unspecified: Secondary | ICD-10-CM

## 2021-11-26 ENCOUNTER — Other Ambulatory Visit: Payer: Self-pay

## 2021-11-26 ENCOUNTER — Other Ambulatory Visit: Payer: Self-pay | Admitting: Internal Medicine

## 2021-11-26 DIAGNOSIS — C719 Malignant neoplasm of brain, unspecified: Secondary | ICD-10-CM

## 2021-12-01 ENCOUNTER — Other Ambulatory Visit: Payer: Self-pay | Admitting: Internal Medicine

## 2021-12-01 DIAGNOSIS — C719 Malignant neoplasm of brain, unspecified: Secondary | ICD-10-CM

## 2021-12-02 ENCOUNTER — Encounter: Payer: Self-pay | Admitting: Internal Medicine

## 2021-12-02 NOTE — Telephone Encounter (Signed)
Patient will have labs and visit with Dr Mickeal Skinner prior to refill of Rx.

## 2021-12-04 ENCOUNTER — Telehealth: Payer: Self-pay | Admitting: Pharmacy Technician

## 2021-12-04 ENCOUNTER — Other Ambulatory Visit (HOSPITAL_COMMUNITY): Payer: Self-pay

## 2021-12-04 ENCOUNTER — Encounter: Payer: Self-pay | Admitting: Internal Medicine

## 2021-12-04 NOTE — Telephone Encounter (Signed)
Oral Oncology Patient Advocate Encounter   Received notification that prior authorization for Temozolomide is required.   PA submitted on 12/04/21 Key [id number] Status is pending     Kelly [name], CPhT-Adv Oncology Pharmacy Patient [place] Direct Number: [phone]  Fax: [phone]

## 2021-12-04 NOTE — Telephone Encounter (Addendum)
Oral Oncology Patient Advocate Encounter  Prior Authorization for Temozolomide has been approved.    PA# [id number]  Effective dates: 12/04/21 through 12/03/22  Patients co-pay is $460.43.    Lady Deutscher, CPhT-Adv Oncology Pharmacy Patient [place] Direct Number: [phone]  Fax: [phone]

## 2021-12-05 ENCOUNTER — Other Ambulatory Visit (HOSPITAL_COMMUNITY): Payer: Self-pay

## 2021-12-05 ENCOUNTER — Other Ambulatory Visit: Payer: Self-pay | Admitting: Pharmacist

## 2021-12-05 ENCOUNTER — Encounter: Payer: Self-pay | Admitting: Internal Medicine

## 2021-12-05 DIAGNOSIS — C719 Malignant neoplasm of brain, unspecified: Secondary | ICD-10-CM

## 2021-12-05 MED ORDER — TEMOZOLOMIDE 100 MG PO CAPS
ORAL_CAPSULE | ORAL | 0 refills | Status: DC
  Filled 2021-12-05: qty 20, 28d supply, fill #0
  Filled 2021-12-05: qty 20, fill #0

## 2021-12-05 NOTE — Progress Notes (Signed)
Oral Oncology Pharmacist Encounter  Patient with new insurance [facility]) that now requires patient to fill Temodar (temozolomide) through [facility]. Prescription redirected from [facility] to [facility] for dispensing.   Leron Croak, PharmD, BCPS, [facility] Hematology/Oncology Clinical Pharmacist [facility] [place] [phone] 12/05/2021 10:28 AM

## 2021-12-09 ENCOUNTER — Telehealth: Payer: Self-pay | Admitting: Internal Medicine

## 2021-12-09 ENCOUNTER — Inpatient Hospital Stay: Payer: BLUE CROSS/BLUE SHIELD | Attending: Neurosurgery

## 2021-12-09 ENCOUNTER — Other Ambulatory Visit: Payer: Self-pay

## 2021-12-09 ENCOUNTER — Inpatient Hospital Stay (HOSPITAL_BASED_OUTPATIENT_CLINIC_OR_DEPARTMENT_OTHER): Payer: BLUE CROSS/BLUE SHIELD | Admitting: Internal Medicine

## 2021-12-09 ENCOUNTER — Encounter: Payer: Self-pay | Admitting: Internal Medicine

## 2021-12-09 VITALS — BP 140/86 | HR 80 | Temp 97.7°F | Resp 17 | Wt 200.8 lb

## 2021-12-09 DIAGNOSIS — Z79899 Other long term (current) drug therapy: Secondary | ICD-10-CM | POA: Insufficient documentation

## 2021-12-09 DIAGNOSIS — R569 Unspecified convulsions: Secondary | ICD-10-CM | POA: Diagnosis not present

## 2021-12-09 DIAGNOSIS — R112 Nausea with vomiting, unspecified: Secondary | ICD-10-CM | POA: Diagnosis not present

## 2021-12-09 DIAGNOSIS — C712 Malignant neoplasm of temporal lobe: Secondary | ICD-10-CM | POA: Insufficient documentation

## 2021-12-09 DIAGNOSIS — C719 Malignant neoplasm of brain, unspecified: Secondary | ICD-10-CM

## 2021-12-09 DIAGNOSIS — I10 Essential (primary) hypertension: Secondary | ICD-10-CM | POA: Diagnosis not present

## 2021-12-09 DIAGNOSIS — F17210 Nicotine dependence, cigarettes, uncomplicated: Secondary | ICD-10-CM | POA: Diagnosis not present

## 2021-12-09 LAB — CMP (CANCER CENTER ONLY)
ALT: 17 U/L (ref 0–44)
AST: 19 U/L (ref 15–41)
Albumin: 4 g/dL (ref 3.5–5.0)
Alkaline Phosphatase: 102 U/L (ref 38–126)
Anion gap: 7 (ref 5–15)
BUN: 6 mg/dL (ref 6–20)
CO2: 27 mmol/L (ref 22–32)
Calcium: 9.5 mg/dL (ref 8.9–10.3)
Chloride: 105 mmol/L (ref 98–111)
Creatinine: 0.77 mg/dL (ref 0.44–1.00)
GFR, Estimated: >60 mL/min (ref >60)
Glucose, Bld: 114 mg/dL — ABNORMAL HIGH (ref 70–99)
Potassium: 3.9 mmol/L (ref 3.5–5.1)
Sodium: 139 mmol/L (ref 135–145)
Total Bilirubin: 0.4 mg/dL (ref 0.3–1.2)
Total Protein: 6.8 g/dL (ref 6.5–8.1)

## 2021-12-09 LAB — CBC WITH DIFFERENTIAL (CANCER CENTER ONLY)
Abs Immature Granulocytes: 0.01 10*3/uL (ref 0.00–0.07)
Basophils Absolute: 0 10*3/uL (ref 0.0–0.1)
Basophils Relative: 1 %
Eosinophils Absolute: 0.1 10*3/uL (ref 0.0–0.5)
Eosinophils Relative: 2 %
HCT: 40.8 % (ref 36.0–46.0)
Hemoglobin: 14.4 g/dL (ref 12.0–15.0)
Immature Granulocytes: 0 %
Lymphocytes Relative: 17 %
Lymphs Abs: 0.9 10*3/uL (ref 0.7–4.0)
MCH: 37.7 pg — ABNORMAL HIGH (ref 26.0–34.0)
MCHC: 35.3 g/dL (ref 30.0–36.0)
MCV: 106.8 fL — ABNORMAL HIGH (ref 80.0–100.0)
Monocytes Absolute: 0.4 10*3/uL (ref 0.1–1.0)
Monocytes Relative: 8 %
Neutro Abs: 4.2 10*3/uL (ref 1.7–7.7)
Neutrophils Relative %: 72 %
Platelet Count: 182 10*3/uL (ref 150–400)
RBC: 3.82 MIL/uL — ABNORMAL LOW (ref 3.87–5.11)
RDW: 12.8 % (ref 11.5–15.5)
WBC Count: 5.7 10*3/uL (ref 4.0–10.5)
nRBC: 0 % (ref 0.0–0.2)

## 2021-12-09 MED ORDER — TEMOZOLOMIDE 100 MG PO CAPS: 200.0000 mg/m2/d | ORAL_CAPSULE | Freq: Every day | ORAL | 0 refills | Status: DC

## 2021-12-09 MED ORDER — PROCHLORPERAZINE MALEATE 10 MG PO TABS: 10.0000 mg | ORAL_TABLET | Freq: Four times a day (QID) | ORAL | 1 refills | Status: DC | PRN

## 2021-12-09 NOTE — Telephone Encounter (Signed)
Per 11/20 los called and spoke to pt about appointment
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
## 2021-12-10 ENCOUNTER — Other Ambulatory Visit: Payer: Self-pay

## 2021-12-18 ENCOUNTER — Other Ambulatory Visit (HOSPITAL_COMMUNITY): Payer: Self-pay

## 2021-12-19 ENCOUNTER — Other Ambulatory Visit: Payer: Self-pay

## 2021-12-20 ENCOUNTER — Encounter: Payer: Self-pay | Admitting: Internal Medicine

## 2021-12-23 ENCOUNTER — Other Ambulatory Visit (HOSPITAL_COMMUNITY): Payer: Self-pay

## 2021-12-23 ENCOUNTER — Telehealth: Payer: Self-pay | Admitting: Pharmacy Technician

## 2021-12-23 ENCOUNTER — Encounter: Payer: Self-pay | Admitting: Internal Medicine

## 2021-12-23 NOTE — Telephone Encounter (Signed)
Oral Oncology Patient Advocate Encounter   Received notification that prior authorization for temozolomide is required by patient's new insurance.   PA submitted on 12/23/2021 Key [id number] Status is pending   PA must be completed via phone call with [place], [place] Patient [place] Direct Number: [phone]  Fax: [phone]

## 2021-12-23 NOTE — Telephone Encounter (Signed)
Oral Oncology Patient Advocate Encounter  Prior Authorization for temozolomide has been approved.    PA# [id number] Effective dates: 12/23/21 through 12/23/22  Patients co-pay is $584.73.  Patient has [facility] [place] which will cover the remaining cost bringing the copay to $0    Kelly [name], [place] Patient [place] Direct Number: [phone]  Fax: [phone]'

## 2021-12-24 ENCOUNTER — Encounter: Payer: Self-pay | Admitting: Internal Medicine

## 2021-12-26 ENCOUNTER — Other Ambulatory Visit: Payer: Self-pay | Admitting: Radiation Therapy

## 2021-12-27 ENCOUNTER — Other Ambulatory Visit: Payer: Self-pay

## 2021-12-31 ENCOUNTER — Ambulatory Visit (HOSPITAL_COMMUNITY)
Admission: RE | Admit: 2021-12-31 | Discharge: 2021-12-31 | Disposition: A | Discharge status: Home / Self Care | Payer: Medicaid Other | Source: Ambulatory Visit | Attending: Internal Medicine | Admitting: Internal Medicine

## 2021-12-31 DIAGNOSIS — C719 Malignant neoplasm of brain, unspecified: Secondary | ICD-10-CM | POA: Diagnosis not present

## 2021-12-31 MED ORDER — GADOBUTROL 1 MMOL/ML IV SOLN
9.0000 mL | Freq: Once | INTRAVENOUS | Status: AC | PRN
  Administered 2021-12-31: 9 mL via INTRAVENOUS

## 2022-01-02 ENCOUNTER — Inpatient Hospital Stay: Payer: Medicaid Other

## 2022-01-02 ENCOUNTER — Inpatient Hospital Stay: Payer: Medicaid Other | Attending: Neurosurgery | Admitting: Internal Medicine

## 2022-01-02 VITALS — BP 124/96 | HR 110 | Temp 97.9°F | Resp 18 | Ht 64.0 in | Wt 201.5 lb

## 2022-01-02 DIAGNOSIS — C712 Malignant neoplasm of temporal lobe: Secondary | ICD-10-CM | POA: Insufficient documentation

## 2022-01-02 DIAGNOSIS — D696 Thrombocytopenia, unspecified: Secondary | ICD-10-CM | POA: Insufficient documentation

## 2022-01-02 DIAGNOSIS — R569 Unspecified convulsions: Secondary | ICD-10-CM | POA: Insufficient documentation

## 2022-01-02 DIAGNOSIS — F17210 Nicotine dependence, cigarettes, uncomplicated: Secondary | ICD-10-CM | POA: Diagnosis not present

## 2022-01-02 DIAGNOSIS — I10 Essential (primary) hypertension: Secondary | ICD-10-CM | POA: Diagnosis not present

## 2022-01-02 DIAGNOSIS — C719 Malignant neoplasm of brain, unspecified: Secondary | ICD-10-CM

## 2022-01-02 LAB — CMP (CANCER CENTER ONLY)
ALT: 17 U/L (ref 0–44)
AST: 18 U/L (ref 15–41)
Albumin: 3.8 g/dL (ref 3.5–5.0)
Alkaline Phosphatase: 109 U/L (ref 38–126)
Anion gap: 6 (ref 5–15)
BUN: 8 mg/dL (ref 6–20)
CO2: 32 mmol/L (ref 22–32)
Calcium: 9.6 mg/dL (ref 8.9–10.3)
Chloride: 105 mmol/L (ref 98–111)
Creatinine: 0.75 mg/dL (ref 0.44–1.00)
GFR, Estimated: >60 mL/min (ref >60)
Glucose, Bld: 114 mg/dL — ABNORMAL HIGH (ref 70–99)
Potassium: 3.9 mmol/L (ref 3.5–5.1)
Sodium: 143 mmol/L (ref 135–145)
Total Bilirubin: 0.4 mg/dL (ref 0.3–1.2)
Total Protein: 6.2 g/dL — ABNORMAL LOW (ref 6.5–8.1)

## 2022-01-02 LAB — CBC WITH DIFFERENTIAL (CANCER CENTER ONLY)
Abs Immature Granulocytes: 0.01 10*3/uL (ref 0.00–0.07)
Basophils Absolute: 0 10*3/uL (ref 0.0–0.1)
Basophils Relative: 1 %
Eosinophils Absolute: 0.2 10*3/uL (ref 0.0–0.5)
Eosinophils Relative: 6 %
HCT: 41 % (ref 36.0–46.0)
Hemoglobin: 14.3 g/dL (ref 12.0–15.0)
Immature Granulocytes: 0 %
Lymphocytes Relative: 28 %
Lymphs Abs: 1.1 10*3/uL (ref 0.7–4.0)
MCH: 36.6 pg — ABNORMAL HIGH (ref 26.0–34.0)
MCHC: 34.9 g/dL (ref 30.0–36.0)
MCV: 104.9 fL — ABNORMAL HIGH (ref 80.0–100.0)
Monocytes Absolute: 0.2 10*3/uL (ref 0.1–1.0)
Monocytes Relative: 6 %
Neutro Abs: 2.3 10*3/uL (ref 1.7–7.7)
Neutrophils Relative %: 59 %
Platelet Count: 79 10*3/uL — ABNORMAL LOW (ref 150–400)
RBC: 3.91 MIL/uL (ref 3.87–5.11)
RDW: 12.2 % (ref 11.5–15.5)
WBC Count: 3.9 10*3/uL — ABNORMAL LOW (ref 4.0–10.5)
nRBC: 0 % (ref 0.0–0.2)

## 2022-01-02 MED ORDER — TEMOZOLOMIDE 100 MG PO CAPS: 200.0000 mg/m2/d | ORAL_CAPSULE | Freq: Every day | ORAL | 0 refills | Status: DC
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
## 2022-01-03 ENCOUNTER — Telehealth: Payer: Self-pay | Admitting: Internal Medicine

## 2022-01-03 NOTE — Telephone Encounter (Signed)
Called patient to schedule f/u. Patient notified of new appointment.

## 2022-01-05 ENCOUNTER — Other Ambulatory Visit: Payer: Self-pay

## 2022-01-06 ENCOUNTER — Inpatient Hospital Stay: Payer: Medicaid Other

## 2022-01-07 ENCOUNTER — Other Ambulatory Visit: Payer: Self-pay | Admitting: Internal Medicine

## 2022-01-07 ENCOUNTER — Telehealth: Payer: Self-pay | Admitting: *Deleted

## 2022-01-07 ENCOUNTER — Other Ambulatory Visit: Payer: Self-pay

## 2022-01-07 MED ORDER — DEXAMETHASONE 2 MG PO TABS: 2.0000 mg | ORAL_TABLET | Freq: Every day | ORAL | 0 refills | Status: DC

## 2022-01-07 MED ORDER — LACOSAMIDE 100 MG PO TABS: 100.0000 mg | ORAL_TABLET | Freq: Two times a day (BID) | ORAL | 2 refills | Status: DC

## 2022-01-07 NOTE — Telephone Encounter (Signed)
Patient called to report she had stopped Decadron 1 mg because she had been seizure free.  Stopped 1 week ago.  As of yesterday her seizures are back.  She is currently dosing Keppra 1500 BID and Vimpat 50 BID.    Per Dr Mickeal Skinner he will increase Vimpat to 100 BID plus existing dose of Keppra 1500 BID.  He will also order Decadron 2 mg once daily for 1 week to help bridge her until her Vimpat is at optimal level.  She know that after 1 week she can stop Decadron.  She states understanding and will call back if this does not cease seizure activity.

## 2022-01-09 ENCOUNTER — Other Ambulatory Visit: Payer: Self-pay | Admitting: Radiation Therapy

## 2022-01-23 ENCOUNTER — Encounter: Payer: Self-pay | Admitting: Internal Medicine

## 2022-02-16 ENCOUNTER — Other Ambulatory Visit: Payer: Self-pay | Admitting: Internal Medicine

## 2022-02-17 ENCOUNTER — Telehealth: Payer: Self-pay | Admitting: *Deleted

## 2022-02-17 ENCOUNTER — Other Ambulatory Visit: Payer: Self-pay | Admitting: Internal Medicine

## 2022-02-17 MED ORDER — AMITRIPTYLINE HCL 100 MG PO TABS: ORAL_TABLET | ORAL | 2 refills | Status: DC

## 2022-02-17 MED ORDER — LACOSAMIDE 100 MG PO TABS: 100.0000 mg | ORAL_TABLET | Freq: Two times a day (BID) | ORAL | 2 refills | Status: DC

## 2022-02-17 NOTE — Addendum Note (Signed)
Addended by: Ventura Sellers on: 02/17/2022 11:00 AM   Modules accepted: Orders

## 2022-02-17 NOTE — Telephone Encounter (Signed)
Patient called after hours to report that she has had migraines.  Was told to avoid taking Motrin.  She was taking amitriptyline 50 mg but Dr Mickeal Skinner instructed her to increase to 100 mg which she has done for some time now.  Still having headaches/migraines.  Need correct Rx for Amitriptyline 100 mg once daily at bedtime and any recommendations he might have for the migraines.    Prior to brain tumor she was taking Topamax.   She is no longer on Decadron.  Reports having increased hip to knee pain.  Discussed benefits of stretching and weight bearing exercises.    Routing to Dr Mickeal Skinner.  Patient wants advice on migraine management and updated Rx.  Needs refill also of Lacosamide.

## 2022-02-17 NOTE — Telephone Encounter (Signed)
Communicated response to patient.  No further questions at this time.

## 2022-02-26 ENCOUNTER — Telehealth: Payer: Self-pay

## 2022-02-26 NOTE — Telephone Encounter (Signed)
This nurse reached out to patient to see how her pain is doing.  Patient describes the pain as a throbbing tightness.  Feels like a pulled muscle from the base of her neck all the way done her back to her tailbone.  Patient states that she has taken Ibuprofen and has been lying on a heating pad and that has given her some relief.  This nurse advised that the provider is not in the office today and if she can get in to see her primary care physician that would be great.  If the pain gets more intense she should have herself evaluated at the emergency department. Patient acknowledged understanding and has no further questions at this time.  This nurse advised that provider will be made aware.

## 2022-02-26 NOTE — Telephone Encounter (Signed)
This nurse received a call from this patient stating that from her neck all the way down to her tail bone it feels like she has pulled a muscle. She states that she is hurting and would like to know should she contact her primary care physician or could it possibly be related to her tumor.  Message forwarded to provider for recommendation.

## 2022-02-27 ENCOUNTER — Encounter: Payer: Self-pay | Admitting: Internal Medicine

## 2022-02-28 ENCOUNTER — Ambulatory Visit (HOSPITAL_COMMUNITY)
Admission: RE | Admit: 2022-02-28 | Discharge: 2022-02-28 | Disposition: A | Discharge status: Home / Self Care | Payer: Medicaid Other | Source: Ambulatory Visit | Attending: Internal Medicine | Admitting: Internal Medicine

## 2022-02-28 DIAGNOSIS — C719 Malignant neoplasm of brain, unspecified: Secondary | ICD-10-CM | POA: Insufficient documentation

## 2022-02-28 MED ORDER — GADOBUTROL 1 MMOL/ML IV SOLN
9.0000 mL | Freq: Once | INTRAVENOUS | Status: AC | PRN
  Administered 2022-02-28: 9 mL via INTRAVENOUS

## 2022-03-03 ENCOUNTER — Inpatient Hospital Stay (HOSPITAL_BASED_OUTPATIENT_CLINIC_OR_DEPARTMENT_OTHER): Payer: Medicaid Other | Admitting: Internal Medicine

## 2022-03-03 ENCOUNTER — Inpatient Hospital Stay: Payer: Medicaid Other

## 2022-03-03 ENCOUNTER — Inpatient Hospital Stay: Payer: Medicaid Other | Attending: Neurosurgery

## 2022-03-03 VITALS — BP 130/73 | HR 108 | Temp 97.4°F | Resp 14 | Wt 198.8 lb

## 2022-03-03 DIAGNOSIS — R29898 Other symptoms and signs involving the musculoskeletal system: Secondary | ICD-10-CM

## 2022-03-03 DIAGNOSIS — C719 Malignant neoplasm of brain, unspecified: Secondary | ICD-10-CM | POA: Diagnosis not present

## 2022-03-03 DIAGNOSIS — F17210 Nicotine dependence, cigarettes, uncomplicated: Secondary | ICD-10-CM | POA: Diagnosis not present

## 2022-03-03 DIAGNOSIS — C712 Malignant neoplasm of temporal lobe: Secondary | ICD-10-CM | POA: Insufficient documentation

## 2022-03-03 DIAGNOSIS — R531 Weakness: Secondary | ICD-10-CM | POA: Diagnosis not present

## 2022-03-03 DIAGNOSIS — R569 Unspecified convulsions: Secondary | ICD-10-CM | POA: Diagnosis not present

## 2022-03-03 DIAGNOSIS — I10 Essential (primary) hypertension: Secondary | ICD-10-CM | POA: Diagnosis not present

## 2022-03-03 LAB — CMP (CANCER CENTER ONLY)
ALT: 13 U/L (ref 0–44)
AST: 13 U/L — ABNORMAL LOW (ref 15–41)
Albumin: 4 g/dL (ref 3.5–5.0)
Alkaline Phosphatase: 110 U/L (ref 38–126)
Anion gap: 9 (ref 5–15)
BUN: 10 mg/dL (ref 6–20)
CO2: 26 mmol/L (ref 22–32)
Calcium: 9.5 mg/dL (ref 8.9–10.3)
Chloride: 104 mmol/L (ref 98–111)
Creatinine: 0.67 mg/dL (ref 0.44–1.00)
GFR, Estimated: >60 mL/min (ref >60)
Glucose, Bld: 109 mg/dL — ABNORMAL HIGH (ref 70–99)
Potassium: 3.8 mmol/L (ref 3.5–5.1)
Sodium: 139 mmol/L (ref 135–145)
Total Bilirubin: 0.5 mg/dL (ref 0.3–1.2)
Total Protein: 6.7 g/dL (ref 6.5–8.1)

## 2022-03-03 LAB — CBC WITH DIFFERENTIAL (CANCER CENTER ONLY)
Abs Immature Granulocytes: 0.01 10*3/uL (ref 0.00–0.07)
Basophils Absolute: 0 10*3/uL (ref 0.0–0.1)
Basophils Relative: 1 %
Eosinophils Absolute: 0.3 10*3/uL (ref 0.0–0.5)
Eosinophils Relative: 4 %
HCT: 41.1 % (ref 36.0–46.0)
Hemoglobin: 14.5 g/dL (ref 12.0–15.0)
Immature Granulocytes: 0 %
Lymphocytes Relative: 23 %
Lymphs Abs: 1.4 10*3/uL (ref 0.7–4.0)
MCH: 36.1 pg — ABNORMAL HIGH (ref 26.0–34.0)
MCHC: 35.3 g/dL (ref 30.0–36.0)
MCV: 102.2 fL — ABNORMAL HIGH (ref 80.0–100.0)
Monocytes Absolute: 0.4 10*3/uL (ref 0.1–1.0)
Monocytes Relative: 7 %
Neutro Abs: 3.9 10*3/uL (ref 1.7–7.7)
Neutrophils Relative %: 65 %
Platelet Count: 172 10*3/uL (ref 150–400)
RBC: 4.02 MIL/uL (ref 3.87–5.11)
RDW: 12.1 % (ref 11.5–15.5)
WBC Count: 6 10*3/uL (ref 4.0–10.5)
nRBC: 0 % (ref 0.0–0.2)
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
## 2022-03-04 ENCOUNTER — Other Ambulatory Visit: Payer: Self-pay

## 2022-03-06 ENCOUNTER — Ambulatory Visit: Payer: Medicaid Other | Admitting: Internal Medicine

## 2022-03-06 ENCOUNTER — Telehealth: Payer: Self-pay | Admitting: Internal Medicine

## 2022-03-06 ENCOUNTER — Other Ambulatory Visit: Payer: Medicaid Other

## 2022-03-06 NOTE — Telephone Encounter (Signed)
Scheduled per 02/12 los, patient has been called and notified.

## 2022-03-07 ENCOUNTER — Emergency Department (HOSPITAL_COMMUNITY): Payer: Medicaid Other

## 2022-03-07 ENCOUNTER — Inpatient Hospital Stay (HOSPITAL_COMMUNITY)
Admission: EM | Admit: 2022-03-07 | Discharge: 2022-03-13 | DRG: 543 | Disposition: A | Discharge status: Rehabilitation Facility | Payer: Medicaid Other | Attending: Internal Medicine | Admitting: Internal Medicine

## 2022-03-07 ENCOUNTER — Emergency Department (HOSPITAL_BASED_OUTPATIENT_CLINIC_OR_DEPARTMENT_OTHER): Admit: 2022-03-07 | Discharge: 2022-03-07 | Disposition: A | Discharge status: Home / Self Care | Payer: Medicaid Other

## 2022-03-07 ENCOUNTER — Other Ambulatory Visit: Payer: Self-pay

## 2022-03-07 ENCOUNTER — Encounter (HOSPITAL_COMMUNITY): Payer: Self-pay

## 2022-03-07 ENCOUNTER — Telehealth: Payer: Self-pay

## 2022-03-07 DIAGNOSIS — K5900 Constipation, unspecified: Secondary | ICD-10-CM | POA: Diagnosis present

## 2022-03-07 DIAGNOSIS — Z888 Allergy status to other drugs, medicaments and biological substances status: Secondary | ICD-10-CM | POA: Diagnosis not present

## 2022-03-07 DIAGNOSIS — Z79899 Other long term (current) drug therapy: Secondary | ICD-10-CM

## 2022-03-07 DIAGNOSIS — Z923 Personal history of irradiation: Secondary | ICD-10-CM | POA: Diagnosis not present

## 2022-03-07 DIAGNOSIS — I69854 Hemiplegia and hemiparesis following other cerebrovascular disease affecting left non-dominant side: Secondary | ICD-10-CM | POA: Diagnosis not present

## 2022-03-07 DIAGNOSIS — M25561 Pain in right knee: Secondary | ICD-10-CM | POA: Diagnosis not present

## 2022-03-07 DIAGNOSIS — D72829 Elevated white blood cell count, unspecified: Secondary | ICD-10-CM | POA: Diagnosis not present

## 2022-03-07 DIAGNOSIS — Z9221 Personal history of antineoplastic chemotherapy: Secondary | ICD-10-CM

## 2022-03-07 DIAGNOSIS — D7589 Other specified diseases of blood and blood-forming organs: Secondary | ICD-10-CM | POA: Diagnosis present

## 2022-03-07 DIAGNOSIS — R7989 Other specified abnormal findings of blood chemistry: Secondary | ICD-10-CM | POA: Diagnosis not present

## 2022-03-07 DIAGNOSIS — Z8249 Family history of ischemic heart disease and other diseases of the circulatory system: Secondary | ICD-10-CM

## 2022-03-07 DIAGNOSIS — I10 Essential (primary) hypertension: Secondary | ICD-10-CM | POA: Diagnosis present

## 2022-03-07 DIAGNOSIS — C712 Malignant neoplasm of temporal lobe: Secondary | ICD-10-CM | POA: Diagnosis not present

## 2022-03-07 DIAGNOSIS — Z83438 Family history of other disorder of lipoprotein metabolism and other lipidemia: Secondary | ICD-10-CM

## 2022-03-07 DIAGNOSIS — D492 Neoplasm of unspecified behavior of bone, soft tissue, and skin: Secondary | ICD-10-CM | POA: Diagnosis not present

## 2022-03-07 DIAGNOSIS — E785 Hyperlipidemia, unspecified: Secondary | ICD-10-CM | POA: Diagnosis present

## 2022-03-07 DIAGNOSIS — F17210 Nicotine dependence, cigarettes, uncomplicated: Secondary | ICD-10-CM | POA: Diagnosis present

## 2022-03-07 DIAGNOSIS — R531 Weakness: Secondary | ICD-10-CM | POA: Diagnosis not present

## 2022-03-07 DIAGNOSIS — C719 Malignant neoplasm of brain, unspecified: Secondary | ICD-10-CM | POA: Diagnosis present

## 2022-03-07 DIAGNOSIS — R200 Anesthesia of skin: Secondary | ICD-10-CM | POA: Diagnosis not present

## 2022-03-07 DIAGNOSIS — R52 Pain, unspecified: Secondary | ICD-10-CM | POA: Diagnosis not present

## 2022-03-07 DIAGNOSIS — M25562 Pain in left knee: Secondary | ICD-10-CM | POA: Diagnosis not present

## 2022-03-07 DIAGNOSIS — R6520 Severe sepsis without septic shock: Secondary | ICD-10-CM | POA: Diagnosis not present

## 2022-03-07 DIAGNOSIS — E039 Hypothyroidism, unspecified: Secondary | ICD-10-CM | POA: Diagnosis present

## 2022-03-07 DIAGNOSIS — R29898 Other symptoms and signs involving the musculoskeletal system: Secondary | ICD-10-CM | POA: Diagnosis not present

## 2022-03-07 DIAGNOSIS — K592 Neurogenic bowel, not elsewhere classified: Secondary | ICD-10-CM | POA: Diagnosis not present

## 2022-03-07 DIAGNOSIS — Z720 Tobacco use: Secondary | ICD-10-CM | POA: Diagnosis present

## 2022-03-07 DIAGNOSIS — F4321 Adjustment disorder with depressed mood: Secondary | ICD-10-CM | POA: Diagnosis not present

## 2022-03-07 DIAGNOSIS — R203 Hyperesthesia: Secondary | ICD-10-CM | POA: Diagnosis not present

## 2022-03-07 DIAGNOSIS — Z6834 Body mass index (BMI) 34.0-34.9, adult: Secondary | ICD-10-CM | POA: Diagnosis not present

## 2022-03-07 DIAGNOSIS — S24101A Unspecified injury at T1 level of thoracic spinal cord, initial encounter: Secondary | ICD-10-CM | POA: Diagnosis not present

## 2022-03-07 DIAGNOSIS — C7951 Secondary malignant neoplasm of bone: Secondary | ICD-10-CM | POA: Diagnosis not present

## 2022-03-07 DIAGNOSIS — S24109S Unspecified injury at unspecified level of thoracic spinal cord, sequela: Secondary | ICD-10-CM | POA: Diagnosis not present

## 2022-03-07 DIAGNOSIS — A419 Sepsis, unspecified organism: Secondary | ICD-10-CM | POA: Diagnosis not present

## 2022-03-07 DIAGNOSIS — J189 Pneumonia, unspecified organism: Secondary | ICD-10-CM | POA: Diagnosis not present

## 2022-03-07 DIAGNOSIS — S24109D Unspecified injury at unspecified level of thoracic spinal cord, subsequent encounter: Secondary | ICD-10-CM | POA: Diagnosis not present

## 2022-03-07 DIAGNOSIS — E871 Hypo-osmolality and hyponatremia: Secondary | ICD-10-CM | POA: Diagnosis not present

## 2022-03-07 DIAGNOSIS — I959 Hypotension, unspecified: Secondary | ICD-10-CM | POA: Diagnosis not present

## 2022-03-07 DIAGNOSIS — Z23 Encounter for immunization: Secondary | ICD-10-CM | POA: Diagnosis not present

## 2022-03-07 DIAGNOSIS — S24109A Unspecified injury at unspecified level of thoracic spinal cord, initial encounter: Secondary | ICD-10-CM | POA: Diagnosis not present

## 2022-03-07 HISTORY — DX: Malignant (primary) neoplasm, unspecified: C80.1

## 2022-03-07 LAB — CBC WITH DIFFERENTIAL/PLATELET
Abs Immature Granulocytes: 0.02 10*3/uL (ref 0.00–0.07)
Basophils Absolute: 0 10*3/uL (ref 0.0–0.1)
Basophils Relative: 0 %
Eosinophils Absolute: 0.2 10*3/uL (ref 0.0–0.5)
Eosinophils Relative: 4 %
HCT: 41.7 % (ref 36.0–46.0)
Hemoglobin: 13.9 g/dL (ref 12.0–15.0)
Immature Granulocytes: 0 %
Lymphocytes Relative: 18 %
Lymphs Abs: 1.1 10*3/uL (ref 0.7–4.0)
MCH: 35.1 pg — ABNORMAL HIGH (ref 26.0–34.0)
MCHC: 33.3 g/dL (ref 30.0–36.0)
MCV: 105.3 fL — ABNORMAL HIGH (ref 80.0–100.0)
Monocytes Absolute: 0.3 10*3/uL (ref 0.1–1.0)
Monocytes Relative: 5 %
Neutro Abs: 4.2 10*3/uL (ref 1.7–7.7)
Neutrophils Relative %: 73 %
Platelets: 176 10*3/uL (ref 150–400)
RBC: 3.96 MIL/uL (ref 3.87–5.11)
RDW: 12.2 % (ref 11.5–15.5)
WBC: 5.8 10*3/uL (ref 4.0–10.5)
nRBC: 0 % (ref 0.0–0.2)

## 2022-03-07 LAB — BASIC METABOLIC PANEL
Anion gap: 9 (ref 5–15)
BUN: 13 mg/dL (ref 6–20)
CO2: 27 mmol/L (ref 22–32)
Calcium: 9.2 mg/dL (ref 8.9–10.3)
Chloride: 99 mmol/L (ref 98–111)
Creatinine, Ser: 0.79 mg/dL (ref 0.44–1.00)
GFR, Estimated: >60 mL/min (ref >60)
Glucose, Bld: 99 mg/dL (ref 70–99)
Potassium: 3.9 mmol/L (ref 3.5–5.1)
Sodium: 135 mmol/L (ref 135–145)

## 2022-03-07 LAB — TROPONIN I (HIGH SENSITIVITY)
Troponin I (High Sensitivity): 2 ng/L (ref <18)
Troponin I (High Sensitivity): 2 ng/L (ref <18)

## 2022-03-07 MED ORDER — DIPHENHYDRAMINE HCL 50 MG/ML IJ SOLN
50.0000 mg | Freq: Once | INTRAMUSCULAR | Status: AC
  Administered 2022-03-07: 50 mg via INTRAVENOUS
  Filled 2022-03-07: qty 1

## 2022-03-07 MED ORDER — MORPHINE SULFATE (PF) 4 MG/ML IV SOLN
4.0000 mg | Freq: Once | INTRAVENOUS | Status: AC
  Administered 2022-03-07: 4 mg via INTRAVENOUS
  Filled 2022-03-07: qty 1

## 2022-03-07 MED ORDER — DEXAMETHASONE SODIUM PHOSPHATE 10 MG/ML IJ SOLN
6.0000 mg | Freq: Four times a day (QID) | INTRAMUSCULAR | Status: DC
  Administered 2022-03-07 – 2022-03-11 (×14): 6 mg via INTRAVENOUS
  Filled 2022-03-07 (×14): qty 1

## 2022-03-07 MED ORDER — GADOBUTROL 1 MMOL/ML IV SOLN
9.0000 mL | Freq: Once | INTRAVENOUS | Status: AC | PRN
  Administered 2022-03-07: 9 mL via INTRAVENOUS

## 2022-03-07 MED ORDER — PROCHLORPERAZINE EDISYLATE 10 MG/2ML IJ SOLN
10.0000 mg | Freq: Once | INTRAMUSCULAR | Status: AC
  Administered 2022-03-07: 10 mg via INTRAVENOUS
  Filled 2022-03-07: qty 2

## 2022-03-07 NOTE — ED Provider Notes (Signed)
[place] EMERGENCY DEPARTMENT AT [facility] Provider Note   CSN: [id number] Arrival date & time: 03/07/22  1347     History {Add pertinent medical, surgical, social history, OB history to HPI:1} Chief Complaint  Patient presents with   Foot Swelling    Cole William is a 57 y.o. female.  The history is provided by the patient, the spouse and medical records. No language interpreter was used.  Neurologic Problem This is a new problem. The current episode started more than 2 days ago. The problem occurs constantly. The problem has been rapidly worsening. Associated symptoms include headaches (mild). Pertinent negatives include no chest pain, no abdominal pain and no shortness of breath. Nothing aggravates the symptoms. Nothing relieves the symptoms. She has tried nothing for the symptoms. The treatment provided no relief.       Home Medications Prior to Admission medications   Medication Sig Start Date End Date Taking? Authorizing Provider  amitriptyline (ELAVIL) 100 MG tablet TAKE 1 TABLET BY MOUTH EVERYDAY AT BEDTIME Patient taking differently: TAKE 2 TABLET BY MOUTH EVERYDAY AT BEDTIME 02/17/22   Vaslow, Acey Lav, MD  atorvastatin (LIPITOR) 10 MG tablet Take 10 mg by mouth at bedtime. 10/16/20   [provider]  cyclobenzaprine (FLEXERIL) 10 MG tablet Take 5-10 mg by mouth at bedtime. 08/18/21   [provider]  dexamethasone (DECADRON) 2 MG tablet Take 1 tablet (2 mg total) by mouth daily. 01/07/22   Ventura Sellers, MD  escitalopram (LEXAPRO) 10 MG tablet Take 10 mg by mouth at bedtime. 12/17/20   [provider]  fluconazole (DIFLUCAN) 100 MG tablet Take 1 tablet (100 mg total) by mouth daily. 10/25/21   Vaslow, Acey Lav, MD  Lacosamide 100 MG TABS Take 1 tablet (100 mg total) by mouth 2 (two) times daily. 02/17/22   Ventura Sellers, MD  levETIRAcetam (KEPPRA) 750 MG tablet TAKE 2 TABLETS (1,500 MG TOTAL) BY MOUTH 2 (TWO) TIMES DAILY.  02/17/22   Ventura Sellers, MD  lisinopril (ZESTRIL) 10 MG tablet Take 1 tablet (10 mg total) by mouth daily. 01/17/21 09/16/21  Oswald Hillock, MD  prochlorperazine (COMPAZINE) 10 MG tablet Take 1 tablet (10 mg total) by mouth every 6 (six) hours as needed for nausea or vomiting. 12/09/21   Ventura Sellers, MD      Allergies    Ranitidine    Review of Systems   Review of Systems  Constitutional:  Negative for chills, diaphoresis, fatigue and fever.  HENT:  Negative for congestion.   Eyes:  Negative for visual disturbance.  Respiratory:  Negative for cough (chronic but nothing new reported), chest tightness, shortness of breath and wheezing.   Cardiovascular:  Negative for chest pain.  Gastrointestinal:  Negative for abdominal pain, constipation, diarrhea, nausea and vomiting.  Genitourinary:  Negative for dysuria and flank pain.  Musculoskeletal:  Positive for back pain and neck pain.  Skin:  Negative for rash and wound.  Neurological:  Positive for weakness, numbness and headaches (mild). Negative for dizziness, facial asymmetry and light-headedness.  Psychiatric/Behavioral:  Negative for agitation and confusion.   All other systems reviewed and are negative.   Physical Exam Updated Vital Signs BP (!) 135/99 (BP Location: Right Arm)   Pulse (!) 103   Temp 98.4 F (36.9 C) (Oral)   Resp 16   SpO2 96%  Physical Exam Vitals and nursing note reviewed.  Constitutional:      General: She is not in acute distress.  Appearance: She is well-developed. She is not ill-appearing, toxic-appearing or diaphoretic.  HENT:     Head: Normocephalic and atraumatic.     Nose: Nose normal.     Mouth/Throat:     Mouth: Mucous membranes are moist.  Eyes:     Extraocular Movements: Extraocular movements intact.     Conjunctiva/sclera: Conjunctivae normal.     Pupils: Pupils are equal, round, and reactive to light.  Cardiovascular:     Rate and Rhythm: Normal rate and regular rhythm.      Heart sounds: No murmur heard. Pulmonary:     Effort: Pulmonary effort is normal. No respiratory distress.     Breath sounds: Normal breath sounds.  Abdominal:     Palpations: Abdomen is soft.     Tenderness: There is no abdominal tenderness. There is no right CVA tenderness, left CVA tenderness, guarding or rebound.  Musculoskeletal:        General: No swelling or tenderness.     Cervical back: Neck supple. No tenderness.     Right lower leg: No edema.     Left lower leg: No edema.  Skin:    General: Skin is warm and dry.     Capillary Refill: Capillary refill takes less than 2 seconds.     Findings: No erythema or rash.  Neurological:     Mental Status: She is alert.     Sensory: Sensory deficit present.     Motor: Weakness present.  Psychiatric:        Mood and Affect: Mood normal.     ED Results / Procedures / Treatments   Labs (all labs ordered are listed, but only abnormal results are displayed) Labs Reviewed  CBC WITH DIFFERENTIAL/PLATELET - Abnormal; Notable for the following components:      Result Value   MCV 105.3 (*)    MCH 35.1 (*)    All other components within normal limits  BASIC METABOLIC PANEL  TROPONIN I (HIGH SENSITIVITY)  TROPONIN I (HIGH SENSITIVITY)    EKG EKG Interpretation  Date/Time:  Friday March 07 2022 16:06:59 EST Ventricular Rate:  99 PR Interval:  133 QRS Duration: 102 QT Interval:  362 QTC Calculation: 465 R Axis:   27 Text Interpretation: Sinus rhythm when compared to prior, similar appearance. No STEMI Confirmed by Antony Blackbird [phone] on 03/07/2022 4:32:01 PM  Radiology VAS [place] LOWER EXTREMITY VENOUS (DVT) (7a-7p)  Result Date: 03/07/2022  Lower Venous DVT Study Patient Name:  ERLEAN HARKNESS  Date of Exam:   03/07/2022 Medical Rec #: [id number]       Accession #:    [id number] Date of Birth: 12/30/1965      Patient Gender: F Patient Age:   33 years Exam Location:  [facility] Procedure:      VAS [place] LOWER EXTREMITY  VENOUS (DVT) Referring Phys: Dorise Bullion --------------------------------------------------------------------------------  Indications: Pain.  Risk Factors: Cancer. Limitations: Poor ultrasound/tissue interface. Comparison Study: No prior studies. Performing Technologist: Oliver Hum RVT  Examination Guidelines: A complete evaluation includes B-mode imaging, spectral Doppler, color Doppler, and power Doppler as needed of all accessible portions of each vessel. Bilateral testing is considered an integral part of a complete examination. Limited examinations for reoccurring indications may be performed as noted. The reflux portion of the exam is performed with the patient in reverse Trendelenburg.  +-----+---------------+---------+-----------+----------+--------------+ RIGHTCompressibilityPhasicitySpontaneityPropertiesThrombus Aging +-----+---------------+---------+-----------+----------+--------------+ CFV  Full           Yes      Yes                                 +-----+---------------+---------+-----------+----------+--------------+   +---------+---------------+---------+-----------+----------+--------------+  LEFT     CompressibilityPhasicitySpontaneityPropertiesThrombus Aging +---------+---------------+---------+-----------+----------+--------------+ CFV      Full           Yes      Yes                                 +---------+---------------+---------+-----------+----------+--------------+ SFJ      Full                                                        +---------+---------------+---------+-----------+----------+--------------+ FV Prox  Full                                                        +---------+---------------+---------+-----------+----------+--------------+ FV Mid   Full                                                        +---------+---------------+---------+-----------+----------+--------------+ FV DistalFull                                                         +---------+---------------+---------+-----------+----------+--------------+ PFV      Full                                                        +---------+---------------+---------+-----------+----------+--------------+ POP      Full           Yes      Yes                                 +---------+---------------+---------+-----------+----------+--------------+ PTV      Full                                                        +---------+---------------+---------+-----------+----------+--------------+ PERO     Full                                                        +---------+---------------+---------+-----------+----------+--------------+    Summary: RIGHT: - No evidence of common femoral vein obstruction.  LEFT: - There is no evidence of deep vein thrombosis in the lower extremity.  - No cystic structure found in the popliteal fossa.  *See table(s) above for measurements and observations.  Preliminary     Procedures Procedures  {Document cardiac monitor, telemetry assessment procedure when appropriate:1}  Medications Ordered in ED Medications - No data to display  ED Course/ Medical Decision Making/ A&P   {   Click here for ABCD2, HEART and other calculatorsREFRESH Note before signing :1}                          Medical Decision Making Amount and/or Complexity of Data Reviewed Radiology: ordered.  Risk Prescription drug management. Decision regarding hospitalization.    Alesia Bushell is a 57 y.o. female with a past medical history significant for hypertension, hyperlipidemia, glioblastoma status post right craniotomy, hypothyroidism, obesity, and focal seizures who presents for worsening left leg numbness and weakness.  According to patient, for the last week or so, she has had progressive symptoms with numbness and weakness in her left leg.  She reports it is now practically insensate from her left mid abdomen  down the leg.  She reports it is not doing what she wants and she has had multiple falls as it is so weak.  She denies any right-sided symptoms.  Denies any arm symptoms.  Denies any speech difficulties, acutely change in vision, or facial symptoms.  She reports she has had some mild headaches and soreness in her back that has been ongoing for some time and denies nausea or vomiting.  Denies fevers, chills, congestion, cough.  She reports her pain is mild to moderate.  She denies urinary changes and denies any bowel or bladder incontinence.  Patient saw her oncologist on Monday who documented a small degree of weakness in the left leg.  Now she cannot lift it off the bed.  On my exam, patient was hyperreflexive in the left lower leg but was insensate and could not raise it.  She had intact pulses.  There was no edema on my exam.  Arm strength was symmetric.  Symmetric smile.  Clear speech.  Lungs clear and chest nontender.  Abdomen nontender.  Back was not focally tender on my exam.  Chart review from the patient's oncologist shows that he requested a spine metastasis MRI to be obtained this weekend however given her progressively worsening symptoms, I gave him a call and he does want it done today.  It was ordered.  He says that there could be leptomeningeal spread so if the MRI does not show anything concerning that could cause her symptoms, he would want a lumbar puncture attempted here.  He would want a cell count, cytology, glucose, and protein to be assessed.  He says that regardless of the workup here, given the progressive worsening and multiple falls, he does feel that she would likely needs admission to the hospital.  I asked if he wanted MRI of the brain and he said that he suspects there is something in her back as opposed to the head so he wanted to hold on that at this time.  Otherwise she did not have any tenderness on her neck or back manage she reports that she has soreness at times when she  moves her back and head.  She did not have true nuchal rigidity and I have low suspicion for meningitis at this time.  Will call for admission once workup is completed.  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  I sent a message to Dr. Mickeal Skinner who is off duty now but wanted to be updated.  Will call neurosurgery to discuss a plan but we will plan on admission to medicine for further management.  10:44 PM Just spoke to Dr. Christella Noa with neurosurgery who reviewed the case.  He feels she does not need emergent surgery right now and wants her to get Decadron 6 every 6 hours.  Will admit to medicine.  {Document critical care time when appropriate:1} {Document review of labs and clinical decision tools ie heart score, Chads2Vasc2 etc:1}  {Document your independent review of radiology images, and any outside records:1} {Document your discussion with family members, caretakers, and with consultants:1} {Document social determinants of health affecting pt's care:1} {Document your decision making why or why not admission, treatments were needed:1} Final Clinical Impression(s) / ED Diagnoses Final diagnoses:  None    Rx / DC Orders ED Discharge Orders     None

## 2022-03-07 NOTE — Progress Notes (Signed)
Left lower extremity venous duplex has been completed. Preliminary results can be found in CV Proc through chart review.  Results were given to Dorise Bullion PA.  03/07/22 3:17 PM Kelly [name] RVT

## 2022-03-07 NOTE — Telephone Encounter (Signed)
This nurse received a message from this patient stating that she is scheduled for an MRI on Sunday 2/18.  However, she has been having difficulty bearing weight on her legs and has had multiple falls over the past few days.  Patient would like to know should she try to get the MRI sooner or what the provider recommendations are.  This nurse forward patients request to the provider.

## 2022-03-07 NOTE — H&P (Signed)
History and Physical    Patient: Kelly [name] [id number] DOB: 19-Nov-1965 DOA: 03/07/2022 DOS: the patient was seen and examined on 03/07/2022 PCP: [facility]  Patient coming from: Home  Chief Complaint:  Chief Complaint  Patient presents with   Foot Swelling   HPI: Kelly [name] is a 57 y.o. female with medical history significant of glioblastoma status post cranieotomy with chemotherapy and radiation therapy who has been recovering and has had close follow-up with neurosurgery, Essential hypertension, hyperlipidemia, depression, hypothyroidism who presents with left lower extremity weakness that happened suddenly.  She noticed swelling of her left lower extremity 4 days ago.  She was sent to have Doppler ultrasound which was done today.  Patient has felt weird sensation on.  Patient has no prior history of clots.  Patient was brought in and found to have inability to move the left lower extremity with decreased sensation mainly on the left side all the way to the lower abdomen on the left.  Her most recent MRI of the brain apparently in January did not show any new masses.  She is still undergoing some chemo.  MRI of the spine however shows a T3-T4 metastatic lesion.  Patient is therefore being admitted to the hospital after conversation with neurosurgery.  Patient follows up with Dr. Mickeal Skinner.  Forde Dandy has been with Dr. Christella Noa who is on-call today.  Review of Systems: As mentioned in the history of present illness. All other systems reviewed and are negative. Past Medical History:  Diagnosis Date   Arthritis    Cancer [facility])    Depression    Dyslipidemia    Essential hypertension 07/31/2015   Hypothyroidism 07/31/2015   doctor took her off medication   Past Surgical History:  Procedure Laterality Date   BLADDER SUSPENSION     CRANIOTOMY Right 03/04/2021   Procedure: CRANIOTOMY TUMOR RESECTION W/ BRAIN LAB;  Surgeon: Newman Pies, MD;  Location: [place];   Service: Neurosurgery;  Laterality: Right;   TONSILLECTOMY     TUBAL LIGATION     Social History:  reports that she has been smoking cigarettes. She has a 28.50 pack-year smoking history. She has never used smokeless tobacco. She reports that she does not drink alcohol and does not use drugs.  Allergies  Allergen Reactions   Ranitidine Anaphylaxis    Family History  Problem Relation Age of Onset   Healthy Mother    Aneurysm Father    Hypertension Father    Hyperlipidemia Father    Healthy Sister    Healthy Daughter    Heart disease Son     Prior to Admission medications   Medication Sig Start Date End Date Taking? Authorizing Provider  amitriptyline (ELAVIL) 100 MG tablet TAKE 1 TABLET BY MOUTH EVERYDAY AT BEDTIME Patient taking differently: TAKE 2 TABLET BY MOUTH EVERYDAY AT BEDTIME 02/17/22   Vaslow, Acey Lav, MD  atorvastatin (LIPITOR) 10 MG tablet Take 10 mg by mouth at bedtime. 10/16/20   [provider]  cyclobenzaprine (FLEXERIL) 10 MG tablet Take 5-10 mg by mouth at bedtime. 08/18/21   [provider]  dexamethasone (DECADRON) 2 MG tablet Take 1 tablet (2 mg total) by mouth daily. 01/07/22   Ventura Sellers, MD  escitalopram (LEXAPRO) 10 MG tablet Take 10 mg by mouth at bedtime. 12/17/20   [provider]  fluconazole (DIFLUCAN) 100 MG tablet Take 1 tablet (100 mg total) by mouth daily. 10/25/21   Ventura Sellers, MD  Lacosamide 100 MG TABS Take  1 tablet (100 mg total) by mouth 2 (two) times daily. 02/17/22   Ventura Sellers, MD  levETIRAcetam (KEPPRA) 750 MG tablet TAKE 2 TABLETS (1,500 MG TOTAL) BY MOUTH 2 (TWO) TIMES DAILY. 02/17/22   Ventura Sellers, MD  lisinopril (ZESTRIL) 10 MG tablet Take 1 tablet (10 mg total) by mouth daily. 01/17/21 09/16/21  Oswald Hillock, MD  prochlorperazine (COMPAZINE) 10 MG tablet Take 1 tablet (10 mg total) by mouth every 6 (six) hours as needed for nausea or vomiting. 12/09/21   Ventura Sellers, MD     Physical Exam: Vitals:   03/07/22 1905 03/07/22 1930 03/07/22 2215 03/07/22 2215  BP:  (!) 133/96  (!) 133/96  Pulse: 97 95  95  Resp:    16  Temp:   98 F (36.7 C)   TempSrc:      SpO2: 96% 96%  100%   Constitutional: Acutely ill looking no distress NAD, calm, comfortable Eyes: PERRL, lids and conjunctivae normal ENMT: Mucous membranes are moist. Posterior pharynx clear of any exudate or lesions.Normal dentition.  Neck: normal, supple, no masses, no thyromegaly Respiratory: clear to auscultation bilaterally, no wheezing, no crackles. Normal respiratory effort. No accessory muscle use.  Cardiovascular: Regular rate and rhythm, no murmurs / rubs / gallops. No extremity edema. 2+ pedal pulses. No carotid bruits.  Abdomen: no tenderness, no masses palpated. No hepatosplenomegaly. Bowel sounds positive.  Musculoskeletal: Good range of motion, no joint swelling or tenderness, Skin: no rashes, lesions, ulcers. No induration Neurologic: CN 2-12 grossly intact.  Patient has 1 out of 5 power in the left lower extremity, decreased sensation throughout, 4 out of 5 power on the right lower extremity Psychiatric: Normal judgment and insight. Alert and oriented x 3. Normal mood  Data Reviewed:  Normal chemistry and CBC, left lower extremity Doppler ultrasound showed no DVT, MRI total spine mets screening shows enhancing intrathecal dural base versus intramedullary mass in the spinal cord around T6 measuring 11 x 14 x 21 concerning for metastatic disease.  Also there is a syrinx extending superiorly from the mass to the level of T3-T4 and then T8-T9.  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  Neurosurgery already consulted.  Patient will be admitted and initiated on dexamethasone, pain management,.  Will get  neurosurgery to decide wound what to do next.  #2 essential hypertension: Blood pressures controlled.  Resume home regimen.  #3 hyperlipidemia: Continue with statin.  #4 history of tobacco abuse: Counseling provided.  #8 morbid obesity: Dietary counseling     Advance Care Planning:   Code Status: Prior full code  Consults: Dr. Mickeal Skinner, Dr. Christella Noa tonight  Family Communication: No family at bedside  Severity of Illness: The appropriate patient status for this patient is INPATIENT. Inpatient status is judged to be reasonable and necessary in order to provide the required intensity of service to ensure the patient's safety. The patient's presenting symptoms, physical exam findings, and initial radiographic and laboratory data in the context of their chronic comorbidities is felt to place them at high risk for further clinical deterioration. Furthermore, it is not anticipated that the patient will be medically stable for discharge from the hospital within 2 midnights of admission.   * I certify that at the point of admission it is my clinical judgment that the patient will require inpatient  hospital care spanning beyond 2 midnights from the point of admission due to high intensity of service, high risk for further deterioration and high frequency of surveillance required.*  AuthorBarbette Merino, MD 03/07/2022 11:15 PM  For on call review [email or web address].

## 2022-03-07 NOTE — ED Triage Notes (Signed)
Patient said for 4 days her left foot has been swollen. Said the swelling has moved up her leg and patient is unable to bear weight. Has a different sensation to her left foot. Has stage 4 glioblastoma, getting chemo. Not on blood thinners, no history of clots.

## 2022-03-07 NOTE — Telephone Encounter (Signed)
This nurse reached out to patient and made her aware that the provider is not able to get the MRI completed sooner.  He recommends that patient go to ED for evaluation due to symptoms progressing.  Patient acknowledged understanding and states she will come to [facility] for evaluation.  No further questions or concerns noted at this time.

## 2022-03-07 NOTE — ED Notes (Signed)
Patient transported to MRI 

## 2022-03-07 NOTE — ED Provider Triage Note (Signed)
Emergency Medicine Provider Triage Evaluation Note  Kelly [name] , a 57 y.o. female  was evaluated in triage.  Pt complains of increased leg pain over the last week, significant swelling over the last 2 to 3 days.  Now notices she is unable to bear weight on that leg.  No recent injury.  Currently undergoing chemo/radiation for active malignancy.  Notes decreased sensation to the toes and decreased range of motion..  Review of Systems  Positive:  Negative: See above  Physical Exam  BP (!) 135/99 (BP Location: Right Arm)   Pulse (!) 103   Temp 98.4 F (36.9 C) (Oral)   Resp 16   SpO2 96%  Gen:   Awake, no distress   Resp:  Normal effort  MSK:   Moves extremities without difficulty  Other:  DP pulse 2+ bilaterally.  Decree subjective sensation to the left foot.  Noticeable 1+ to 2+ nonpitting edema of the LLE.  Mild cool sensation to the skin in comparison to the RLE.  Able to wiggle toes of LLE, though noticeably decreased from RLE.    Medical Decision Making  Medically screening exam initiated at 3:00 PM.  Appropriate orders placed.  Kelly [name] was informed that the remainder of the evaluation will be completed by another provider, this initial triage assessment does not replace that evaluation, and the importance of remaining in the ED until their evaluation is complete.     Kelly Rome, PA-C [phone]

## 2022-03-08 ENCOUNTER — Ambulatory Visit
Admit: 2022-03-08 | Discharge: 2022-03-08 | Disposition: A | Discharge status: Home / Self Care | Payer: Medicaid Other | Attending: Radiation Oncology | Admitting: Radiation Oncology

## 2022-03-08 DIAGNOSIS — C719 Malignant neoplasm of brain, unspecified: Secondary | ICD-10-CM

## 2022-03-08 DIAGNOSIS — C7951 Secondary malignant neoplasm of bone: Secondary | ICD-10-CM

## 2022-03-08 LAB — CBC
HCT: 41.2 % (ref 36.0–46.0)
Hemoglobin: 14.1 g/dL (ref 12.0–15.0)
MCH: 35.4 pg — ABNORMAL HIGH (ref 26.0–34.0)
MCHC: 34.2 g/dL (ref 30.0–36.0)
MCV: 103.5 fL — ABNORMAL HIGH (ref 80.0–100.0)
Platelets: 172 10*3/uL (ref 150–400)
RBC: 3.98 MIL/uL (ref 3.87–5.11)
RDW: 11.9 % (ref 11.5–15.5)
WBC: 6.7 10*3/uL (ref 4.0–10.5)
nRBC: 0 % (ref 0.0–0.2)

## 2022-03-08 LAB — BASIC METABOLIC PANEL
Anion gap: 11 (ref 5–15)
BUN: 10 mg/dL (ref 6–20)
CO2: 24 mmol/L (ref 22–32)
Calcium: 8.9 mg/dL (ref 8.9–10.3)
Chloride: 101 mmol/L (ref 98–111)
Creatinine, Ser: 0.68 mg/dL (ref 0.44–1.00)
GFR, Estimated: >60 mL/min (ref >60)
Glucose, Bld: 116 mg/dL — ABNORMAL HIGH (ref 70–99)
Potassium: 3.9 mmol/L (ref 3.5–5.1)
Sodium: 136 mmol/L (ref 135–145)

## 2022-03-08 LAB — HIV ANTIBODY (ROUTINE TESTING W REFLEX): HIV Screen 4th Generation wRfx: NONREACTIVE

## 2022-03-08 MED ORDER — ENOXAPARIN SODIUM 40 MG/0.4ML IJ SOSY
40.0000 mg | PREFILLED_SYRINGE | INTRAMUSCULAR | Status: DC
  Administered 2022-03-08 – 2022-03-13 (×6): 40 mg via SUBCUTANEOUS
  Filled 2022-03-08 (×6): qty 0.4

## 2022-03-08 MED ORDER — LISINOPRIL 10 MG PO TABS
10.0000 mg | ORAL_TABLET | Freq: Every day | ORAL | Status: DC
  Administered 2022-03-08 – 2022-03-12 (×5): 10 mg via ORAL
  Filled 2022-03-08 (×5): qty 1

## 2022-03-08 MED ORDER — CYCLOBENZAPRINE HCL 5 MG PO TABS
5.0000 mg | ORAL_TABLET | Freq: Every day | ORAL | Status: DC
  Administered 2022-03-08 – 2022-03-12 (×5): 10 mg via ORAL
  Filled 2022-03-08 (×5): qty 2

## 2022-03-08 MED ORDER — LACOSAMIDE 50 MG PO TABS
100.0000 mg | ORAL_TABLET | Freq: Two times a day (BID) | ORAL | Status: DC
  Administered 2022-03-08 – 2022-03-13 (×12): 100 mg via ORAL
  Filled 2022-03-08 (×12): qty 2

## 2022-03-08 MED ORDER — PANTOPRAZOLE SODIUM 40 MG PO TBEC
40.0000 mg | DELAYED_RELEASE_TABLET | Freq: Two times a day (BID) | ORAL | Status: DC
  Administered 2022-03-08 – 2022-03-13 (×11): 40 mg via ORAL
  Filled 2022-03-08 (×11): qty 1

## 2022-03-08 MED ORDER — HYDROMORPHONE HCL 1 MG/ML IJ SOLN
1.0000 mg | INTRAMUSCULAR | Status: DC | PRN
  Administered 2022-03-08 – 2022-03-09 (×10): 1 mg via INTRAVENOUS
  Filled 2022-03-08 (×10): qty 1

## 2022-03-08 MED ORDER — DICLOFENAC SODIUM 1 % EX GEL
2.0000 g | Freq: Four times a day (QID) | CUTANEOUS | Status: DC
  Administered 2022-03-08 – 2022-03-13 (×15): 2 g via TOPICAL
  Filled 2022-03-08: qty 100

## 2022-03-08 MED ORDER — AMITRIPTYLINE HCL 50 MG PO TABS
100.0000 mg | ORAL_TABLET | Freq: Every day | ORAL | Status: DC
  Administered 2022-03-08 – 2022-03-12 (×5): 100 mg via ORAL
  Filled 2022-03-08 (×5): qty 2

## 2022-03-08 MED ORDER — ONDANSETRON HCL 4 MG/2ML IJ SOLN: 4.0000 mg | Freq: Four times a day (QID) | INTRAMUSCULAR | Status: DC | PRN

## 2022-03-08 MED ORDER — ESCITALOPRAM OXALATE 10 MG PO TABS
10.0000 mg | ORAL_TABLET | Freq: Every day | ORAL | Status: DC
  Administered 2022-03-08 – 2022-03-12 (×5): 10 mg via ORAL
  Filled 2022-03-08 (×5): qty 1

## 2022-03-08 MED ORDER — SODIUM CHLORIDE 0.9 % IV SOLN: INTRAVENOUS | Status: DC

## 2022-03-08 MED ORDER — LEVETIRACETAM 500 MG PO TABS
1500.0000 mg | ORAL_TABLET | Freq: Two times a day (BID) | ORAL | Status: DC
  Administered 2022-03-08 – 2022-03-13 (×12): 1500 mg via ORAL
  Filled 2022-03-08 (×12): qty 3

## 2022-03-08 MED ORDER — ONDANSETRON HCL 4 MG PO TABS: 4.0000 mg | ORAL_TABLET | Freq: Four times a day (QID) | ORAL | Status: DC | PRN

## 2022-03-08 MED ORDER — ORAL CARE MOUTH RINSE: 15.0000 mL | OROMUCOSAL | Status: DC | PRN

## 2022-03-08 NOTE — Consult Note (Signed)
Reason for Consult:spinal cord tumor Referring Physician: Savita, Kelly [name] is an 57 y.o. female.  HPI: with hx of increasing numbness and weakness over last two weeks. Presented to the ED unable to move her left lower extremity. 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, and nodularity posteriorly at T3.4 Maintains bowel and bladder continence Past Medical History:  Diagnosis Date   Arthritis    Cancer [facility])    Depression    Dyslipidemia    Essential hypertension 07/31/2015   Hypothyroidism 07/31/2015   doctor took her off medication    Past Surgical History:  Procedure Laterality Date   BLADDER SUSPENSION     CRANIOTOMY Right 03/04/2021   Procedure: CRANIOTOMY TUMOR RESECTION W/ BRAIN LAB;  Surgeon: Newman Pies, MD;  Location: [facility];  Service: Neurosurgery;  Laterality: Right;   TONSILLECTOMY     TUBAL LIGATION      Family History  Problem Relation Age of Onset   Healthy Mother    Aneurysm Father    Hypertension Father    Hyperlipidemia Father    Healthy Sister    Healthy Daughter    Heart disease Son     Social History:  reports that she has been smoking cigarettes. She has a 28.50 pack-year smoking history. She has never used smokeless tobacco. She reports that she does not drink alcohol and does not use drugs.  Allergies:  Allergies  Allergen Reactions   Ranitidine Anaphylaxis    Medications: I have reviewed the patient's current medications.  Results for orders placed or performed during the hospital encounter of 03/07/22 (from the past 48 hour(s))  Basic metabolic panel     Status: None   Collection Time: 03/07/22  3:58 PM  Result Value Ref Range   Sodium 135 135 - 145 mmol/L   Potassium 3.9 3.5 - 5.1 mmol/L   Chloride 99 98 - 111 mmol/L   CO2 27 22 - 32 mmol/L   Glucose, Bld 99 70 - 99 mg/dL    Comment: Glucose reference range applies only to samples taken after fasting for at least 8 hours.   BUN 13 6 - 20 mg/dL    Creatinine, Ser 0.79 0.44 - 1.00 mg/dL   Calcium 9.2 8.9 - 10.3 mg/dL   GFR, Estimated >60 >60 mL/min    Comment: (NOTE) Calculated using the CKD-EPI Creatinine Equation (2021)    Anion gap 9 5 - 15    Comment: Performed at [facility], [place] [address] [place], [place] [address]  CBC with Differential     Status: Abnormal   Collection Time: 03/07/22  3:58 PM  Result Value Ref Range   WBC 5.8 4.0 - 10.5 K/uL   RBC 3.96 3.87 - 5.11 MIL/uL   Hemoglobin 13.9 12.0 - 15.0 g/dL   HCT 41.7 36.0 - 46.0 %   MCV 105.3 (H) 80.0 - 100.0 fL   MCH 35.1 (H) 26.0 - 34.0 pg   MCHC 33.3 30.0 - 36.0 g/dL   RDW 12.2 11.5 - 15.5 %   Platelets 176 150 - 400 K/uL   nRBC 0.0 0.0 - 0.2 %   Neutrophils Relative % 73 %   Neutro Abs 4.2 1.7 - 7.7 K/uL   Lymphocytes Relative 18 %   Lymphs Abs 1.1 0.7 - 4.0 K/uL   Monocytes Relative 5 %   Monocytes Absolute 0.3 0.1 - 1.0 K/uL   Eosinophils Relative 4 %   Eosinophils Absolute 0.2 0.0 -  0.5 K/uL   Basophils Relative 0 %   Basophils Absolute 0.0 0.0 - 0.1 K/uL   Immature Granulocytes 0 %   Abs Immature Granulocytes 0.02 0.00 - 0.07 K/uL    Comment: Performed at [facility], [place] [address], [place], [place] [address]  Troponin I (High Sensitivity)     Status: None   Collection Time: 03/07/22  3:58 PM  Result Value Ref Range   Troponin I (High Sensitivity) 2 <18 ng/L    Comment: (NOTE) Elevated high sensitivity troponin I (hsTnI) values and significant  changes across serial measurements may suggest ACS but many other  chronic and acute conditions are known to elevate hsTnI results.  Refer to the "Links" section for chest pain algorithms and additional  guidance. Performed at [facility], [place] [address] [place], [place] [address]   Troponin I (High Sensitivity)     Status: None   Collection Time: 03/07/22  5:10 PM  Result Value Ref Range   Troponin I (High Sensitivity) 2 <18 ng/L    Comment:  (NOTE) Elevated high sensitivity troponin I (hsTnI) values and significant  changes across serial measurements may suggest ACS but many other  chronic and acute conditions are known to elevate hsTnI results.  Refer to the "Links" section for chest pain algorithms and additional  guidance. Performed at [facility], [place] [address] [place], [place] [address]   HIV Antibody (routine testing w rflx)     Status: None   Collection Time: 03/08/22  3:19 AM  Result Value Ref Range   HIV Screen 4th Generation wRfx Non Reactive Non Reactive    Comment: Performed at [facility], [place] [address], [place], [place] [id number]  Basic metabolic panel     Status: Abnormal   Collection Time: 03/08/22  3:19 AM  Result Value Ref Range   Sodium 136 135 - 145 mmol/L   Potassium 3.9 3.5 - 5.1 mmol/L   Chloride 101 98 - 111 mmol/L   CO2 24 22 - 32 mmol/L   Glucose, Bld 116 (H) 70 - 99 mg/dL    Comment: Glucose reference range applies only to samples taken after fasting for at least 8 hours.   BUN 10 6 - 20 mg/dL   Creatinine, Ser 0.68 0.44 - 1.00 mg/dL   Calcium 8.9 8.9 - 10.3 mg/dL   GFR, Estimated >60 >60 mL/min    Comment: (NOTE) Calculated using the CKD-EPI Creatinine Equation (2021)    Anion gap 11 5 - 15    Comment: Performed at [facility], [place] [address] [place], [place] [address]  CBC     Status: Abnormal   Collection Time: 03/08/22  3:19 AM  Result Value Ref Range   WBC 6.7 4.0 - 10.5 K/uL   RBC 3.98 3.87 - 5.11 MIL/uL   Hemoglobin 14.1 12.0 - 15.0 g/dL   HCT 41.2 36.0 - 46.0 %   MCV 103.5 (H) 80.0 - 100.0 fL   MCH 35.4 (H) 26.0 - 34.0 pg   MCHC 34.2 30.0 - 36.0 g/dL   RDW 11.9 11.5 - 15.5 %   Platelets 172 150 - 400 K/uL   nRBC 0.0 0.0 - 0.2 %    Comment: Performed at [facility], [place] [address] [place], [place] [address]    MR TOTAL SPINE METS SCREENING  Result Date: 03/07/2022 CLINICAL DATA:  Left leg swelling,  unable to move left foot, history of glioblastoma  chest EXAM: MRI TOTAL SPINE WITHOUT AND WITH CONTRAST TECHNIQUE: Multisequence MR imaging of the spine from the cervical spine to the sacrum was performed prior to and following IV contrast administration for evaluation of spinal metastatic disease. CONTRAST:  20m GADAVIST GADOBUTROL 1 MMOL/ML IV SOLN COMPARISON:  None Available. FINDINGS: MRI CERVICAL SPINE FINDINGS Alignment: Physiologic. Vertebrae: No fracture, evidence of discitis, or bone lesion. No abnormal enhancement. Cord: Normal signal and morphology.  No abnormal enhancement. Posterior Fossa, vertebral arteries, paraspinal tissues: Please see MRI brain 02/28/2022 for intracranial findings. Disc levels: Limited imaging of the cervical spine demonstrates no significant spinal canal stenosis. MRI THORACIC SPINE FINDINGS Alignment:  No listhesis. Vertebrae: No fracture, evidence of discitis, or bone lesion. No abnormal enhancement. Cord: Enhancing, intrathecal, dural based or intramedullary lesion in the spinal cord, centered at T6, which measures 11 x 14 x 21 mm (AP x TR x CC) (series 24, image 24 and series 21, image 7). The mass extends towards the left neural foramen at T6-T7. This is associated with a syrinx extending superiorly from the mass to the level of T3-T4 and inferiorly to the level of T8-T9 (series 16, image 8). Minimal normal spinal cord signal is noted at the right aspect of the spinal canal (series 24, image 24). Somewhat nodular enhancement along the dorsal aspect of the spinal cord at T3 (series 24, image 8 and series 21, image 8), which may indicate additional leptomeningeal disease or be vascular. Paraspinal and other soft tissues: Not well evaluated due to respiratory motion artifact. Large hiatal hernia. Disc levels: No osseous spinal canal stenosis. MRI LUMBAR SPINE FINDINGS Segmentation:  5 lumbar type vertebral bodies. Alignment:  No significant listhesis. Vertebrae: No fracture,  evidence of discitis, or bone lesion. No abnormal enhancement. Conus medullaris: Extends to the L1 level and appears normal. No abnormal enhancement. Paraspinal and other soft tissues: No acute finding on the limited series. Disc levels: No osseous spinal canal stenosis. IMPRESSION: 1. Enhancing, intrathecal, dural based versus intramedullary mass in the spinal cord, centered at T6, which measures 11 x 14 x 21 mm, concerning for metastatic disease from the patient's glioblastoma. A this is associated with a syrinx extending superiorly from the mass to the level of T3-T4 and inferiorly to the level of T8-T9. 2. Additional somewhat nodular enhancement along the dorsal aspect of the spinal cord at T3, which may indicate additional leptomeningeal disease or be vascular. 3. No additional enhancing lesions are seen in the cervical, thoracic, or lumbar spine. These results were called by telephone at the time of interpretation on 03/07/2022 at 10:00 pm [facility] who verbally acknowledged these results. Electronically Signed   By: AMerilyn BabaM.D.   On: 03/07/2022 22:00   VAS [place]OWER EXTREMITY VENOUS (DVT) (7a-7p)  Result Date: 03/07/2022  Lower Venous DVT Study Patient Name:  CPRISCILLE COINER Date of Exam:   03/07/2022 Medical Rec #: [id number]      Accession #:    [id number] of Birth: 108/25/1967     Patient Gender: F Patient Age:   89years [facility]      VAS [place]OWER EXTREMITY VENOUS (DVT) Referring Phys: ADorise Bullion--------------------------------------------------------------------------------  Indications: Pain.  Risk Factors: Cancer. Limitations: Poor ultrasound/tissue interface. Comparison Study: No prior studies. Performing Technologist: GOliver HumRVT  Examination Guidelines: A complete evaluation includes B-mode imaging, spectral Doppler, color Doppler, and power Doppler as needed of all accessible portions of  each vessel.  Bilateral testing is considered an integral part of a complete examination. Limited examinations for reoccurring indications may be performed as noted. The reflux portion of the exam is performed with the patient in reverse Trendelenburg.  +-----+---------------+---------+-----------+----------+--------------+ RIGHTCompressibilityPhasicitySpontaneityPropertiesThrombus Aging +-----+---------------+---------+-----------+----------+--------------+ CFV  Full           Yes      Yes                                 +-----+---------------+---------+-----------+----------+--------------+   +---------+---------------+---------+-----------+----------+--------------+ LEFT     CompressibilityPhasicitySpontaneityPropertiesThrombus Aging +---------+---------------+---------+-----------+----------+--------------+ CFV      Full           Yes      Yes                                 +---------+---------------+---------+-----------+----------+--------------+ SFJ      Full                                                        +---------+---------------+---------+-----------+----------+--------------+ FV Prox  Full                                                        +---------+---------------+---------+-----------+----------+--------------+ FV Mid   Full                                                        +---------+---------------+---------+-----------+----------+--------------+ FV DistalFull                                                        +---------+---------------+---------+-----------+----------+--------------+ PFV      Full                                                        +---------+---------------+---------+-----------+----------+--------------+ POP      Full           Yes      Yes                                 +---------+---------------+---------+-----------+----------+--------------+ PTV      Full                                                         +---------+---------------+---------+-----------+----------+--------------+ PERO     Full                                                        +---------+---------------+---------+-----------+----------+--------------+  Summary: RIGHT: - No evidence of common femoral vein obstruction.  LEFT: - There is no evidence of deep vein thrombosis in the lower extremity.  - No cystic structure found in the popliteal fossa.  *See table(s) above for measurements and observations. Electronically signed by Deitra Mayo MD on 03/07/2022 at 5:08:36 PM.    Final     Review of Systems  Constitutional:  Positive for activity change.  HENT: Negative.    Eyes: Negative.   Respiratory: Negative.    Cardiovascular: Negative.   Gastrointestinal: Negative.   Endocrine: Negative.   Genitourinary: Negative.   Musculoskeletal:  Positive for gait problem.  Skin: Negative.   Neurological:  Positive for weakness and numbness.  Hematological: Negative.   Psychiatric/Behavioral: Negative.     Blood pressure (!) 143/94, pulse (!) 103, temperature 98.9 F (37.2 C), resp. rate 17, height 5' 4"$  (1.626 m), weight 90.7 kg, SpO2 92 %. Physical Exam Constitutional:      Appearance: Normal appearance.  HENT:     Head: Normocephalic.     Comments: Well healed scar right temporal region    Right Ear: Tympanic membrane normal.     Left Ear: Tympanic membrane normal.  Cardiovascular:     Rate and Rhythm: Normal rate and regular rhythm.  Skin:    General: Skin is warm and dry.  Neurological:     Mental Status: She is alert and oriented to person, place, and time.     Motor: Weakness present.     Coordination: Coordination is intact.     Comments: No movement left lower extremity, full strength on right side   Psychiatric:        Mood and Affect: Mood normal.        Thought Content: Thought content normal.        Judgment: Judgment normal.     Assessment/Plan: Amare Dinallo is a 57  y.o. 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 Do not believe the mass is resectable with any expectation that she would maintain lower extremity function. Her case will discussed at tumor board this coming Monday.   Ashok Pall 03/08/2022, 6:57 PM

## 2022-03-08 NOTE — Consult Note (Signed)
Radiation Oncology         [phone] ________________________________  Name: Kelly [name] MRN: [id number]  Date: 03/07/2022  DOB: 1965/03/21  [id number], [facility]  No ref. provider found     REFERRING PHYSICIAN: No ref. 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 Diagnoses of Left leg weakness and Left leg numbness were also pertinent to this visit.   HISTORY OF PRESENT ILLNESS::Kelly [name] is a 57 y.o. female who is seen for an initial consultation visit regarding the patient's diagnosis of glioblastoma.  The patient previously completed a course of chemoradiation treatment to the brain, including 6 weeks of radiation, in April of last year.  She describes worsening weakness in the left lower extremity which prompted her admission.  She states that her weakness began possibly in December and has progressed since then, most markedly on Wednesday when she was not able to ambulate and began losing all strength in the leg.  Strength on admission was 0 out of 5.  The patient has started steroids and is able to move her toes a little which is an improvement.  The patient has been seen by neurosurgery who does not feel that the tumor is resectable.  I have been asked to see the patient for consideration of radiation treatment.    PREVIOUS RADIATION THERAPY: Yes the patient completed 60 Gray in 30 fractions to the brain in April 2023   PAST MEDICAL HISTORY:  has a past medical history of Arthritis, Cancer ([place]), Depression, Dyslipidemia, Essential hypertension (07/31/2015), and Hypothyroidism (07/31/2015).     PAST SURGICAL HISTORY: Past Surgical History:  Procedure Laterality Date   BLADDER SUSPENSION     CRANIOTOMY Right 03/04/2021   Procedure: CRANIOTOMY TUMOR RESECTION W/ BRAIN LAB;  Surgeon: Newman Pies, MD;  Location: [facility];  Service: Neurosurgery;  Laterality: Right;   TONSILLECTOMY     TUBAL LIGATION       FAMILY  HISTORY: family history includes Aneurysm in her father; Healthy in her daughter, mother, and sister; Heart disease in her son; Hyperlipidemia in her father; Hypertension in her father.   SOCIAL HISTORY:  reports that she has been smoking cigarettes. She has a 28.50 pack-year smoking history. She has never used smokeless tobacco. She reports that she does not drink alcohol and does not use drugs.   ALLERGIES: Ranitidine   MEDICATIONS:  Current Facility-Administered Medications  Medication Dose Route Frequency Provider Last Rate Last Admin   amitriptyline (ELAVIL) tablet 100 mg  100 mg Oral QHS Garba, Mohammad L, MD       cyclobenzaprine (FLEXERIL) tablet 5-10 mg  5-10 mg Oral QHS Garba, Mohammad L, MD       dexamethasone (DECADRON) injection 6 mg  6 mg Intravenous Q6H Gala Romney L, MD   6 mg at 03/08/22 1713   diclofenac Sodium (VOLTAREN) 1 % topical gel 2 g  2 g Topical QID Debbe Odea, MD   2 g at 03/08/22 1435   enoxaparin (LOVENOX) injection 40 mg  40 mg Subcutaneous Q24H Rizwan, Eunice Blase, MD   40 mg at 03/08/22 1051   escitalopram (LEXAPRO) tablet 10 mg  10 mg Oral QHS Garba, Mohammad L, MD       HYDROmorphone (DILAUDID) injection 1 mg  1 mg Intravenous Q2H PRN Elwyn Reach, MD   1 mg at 03/08/22 1713   lacosamide (VIMPAT) tablet 100 mg  100 mg Oral BID Elwyn Reach, MD   100 mg at 03/08/22  1051   levETIRAcetam (KEPPRA) tablet 1,500 mg  1,500 mg Oral BID Gala Romney L, MD   1,500 mg at 03/08/22 1051   lisinopril (ZESTRIL) tablet 10 mg  10 mg Oral Daily Rizwan, Eunice Blase, MD   10 mg at 03/08/22 1434   ondansetron (ZOFRAN) tablet 4 mg  4 mg Oral Q6H PRN Elwyn Reach, MD       Or   ondansetron (ZOFRAN) injection 4 mg  4 mg Intravenous Q6H PRN Elwyn Reach, MD       Oral care mouth rinse  15 mL Mouth Rinse PRN Elwyn Reach, MD       pantoprazole (PROTONIX) EC tablet 40 mg  40 mg Oral BID Debbe Odea, MD   40 mg at 03/08/22 1051     REVIEW OF SYSTEMS:  A  15 point review of systems is documented in the electronic medical record. This was obtained by the nursing staff. However, I reviewed this with the patient to discuss relevant findings and make appropriate changes.  Pertinent items are noted in HPI. Pertinent items noted in HPI and remainder of comprehensive ROS otherwise negative.    PHYSICAL EXAM:  height is 5' 4"$  (1.626 m) and weight is 199 lb 15.3 oz (90.7 kg). Her temperature is 98.9 F (37.2 C). Her blood pressure is 143/94 (abnormal) and her pulse is 103 (abnormal). Her respiration is 17 and oxygen saturation is 92%.   ECOG = 2-3 based on ambulation  0 - Asymptomatic (Fully active, able to carry on all predisease activities without restriction)  1 - Symptomatic but completely ambulatory (Restricted in physically strenuous activity but ambulatory and able to carry out work of a light or sedentary nature. For example, light housework, office work)  2 - Symptomatic, <50% in bed during the day (Ambulatory and capable of all self care but unable to carry out any work activities. Up and about more than 50% of waking hours)  3 - Symptomatic, >50% in bed, but not bedbound (Capable of only limited self-care, confined to bed or chair 50% or more of waking hours)  4 - Bedbound (Completely disabled. Cannot carry on any self-care. Totally confined to bed or chair)  5 - Death   Kelly [name] MM, Creech RH, Tormey DC, et al. [phone]). "Toxicity and response criteria of the [facility]". [place] Oncol. 5 (6): 649-55  Alert, no acute distress 0/5 strength in the left lower extremity with the exception of being able to wiggle her toes on the left; no significant weakness on the right or in the upper extremities   LABORATORY DATA:  Lab Results  Component Value Date   WBC 6.7 03/08/2022   HGB 14.1 03/08/2022   HCT 41.2 03/08/2022   MCV 103.5 (H) 03/08/2022   PLT 172 03/08/2022   Lab Results  Component Value Date   NA 136  03/08/2022   K 3.9 03/08/2022   CL 101 03/08/2022   CO2 24 03/08/2022   Lab Results  Component Value Date   ALT 13 03/03/2022   AST 13 (L) 03/03/2022   ALKPHOS 110 03/03/2022   BILITOT 0.5 03/03/2022      RADIOGRAPHY: MR TOTAL SPINE METS SCREENING  Result Date: 03/07/2022 CLINICAL DATA:  Left leg swelling, unable to move left foot, history of glioblastoma chest EXAM: MRI TOTAL SPINE WITHOUT AND WITH CONTRAST TECHNIQUE: Multisequence MR imaging of the spine from the cervical spine to the sacrum was performed prior to and following IV  contrast administration for evaluation of spinal metastatic disease. CONTRAST:  60m GADAVIST GADOBUTROL 1 MMOL/ML IV SOLN COMPARISON:  None Available. FINDINGS: MRI CERVICAL SPINE FINDINGS Alignment: Physiologic. Vertebrae: No fracture, evidence of discitis, or bone lesion. No abnormal enhancement. Cord: Normal signal and morphology.  No abnormal enhancement. Posterior Fossa, vertebral arteries, paraspinal tissues: Please see MRI brain 02/28/2022 for intracranial findings. Disc levels: Limited imaging of the cervical spine demonstrates no significant spinal canal stenosis. MRI THORACIC SPINE FINDINGS Alignment:  No listhesis. Vertebrae: No fracture, evidence of discitis, or bone lesion. No abnormal enhancement. Cord: Enhancing, intrathecal, dural based or intramedullary lesion in the spinal cord, centered at T6, which measures 11 x 14 x 21 mm (AP x TR x CC) (series 24, image 24 and series 21, image 7). The mass extends towards the left neural foramen at T6-T7. This is associated with a syrinx extending superiorly from the mass to the level of T3-T4 and inferiorly to the level of T8-T9 (series 16, image 8). Minimal normal spinal cord signal is noted at the right aspect of the spinal canal (series 24, image 24). Somewhat nodular enhancement along the dorsal aspect of the spinal cord at T3 (series 24, image 8 and series 21, image 8), which may indicate additional  leptomeningeal disease or be vascular. Paraspinal and other soft tissues: Not well evaluated due to respiratory motion artifact. Large hiatal hernia. Disc levels: No osseous spinal canal stenosis. MRI LUMBAR SPINE FINDINGS Segmentation:  5 lumbar type vertebral bodies. Alignment:  No significant listhesis. Vertebrae: No fracture, evidence of discitis, or bone lesion. No abnormal enhancement. Conus medullaris: Extends to the L1 level and appears normal. No abnormal enhancement. Paraspinal and other soft tissues: No acute finding on the limited series. Disc levels: No osseous spinal canal stenosis. IMPRESSION: 1. Enhancing, intrathecal, dural based versus intramedullary mass in the spinal cord, centered at T6, which measures 11 x 14 x 21 mm, concerning for metastatic disease from the patient's glioblastoma. A this is associated with a syrinx extending superiorly from the mass to the level of T3-T4 and inferiorly to the level of T8-T9. 2. Additional somewhat nodular enhancement along the dorsal aspect of the spinal cord at T3, which may indicate additional leptomeningeal disease or be vascular. 3. No additional enhancing lesions are seen in the cervical, thoracic, or lumbar spine. These results were called by telephone at the time of interpretation on 03/07/2022 at 10:00 pm to [facility], who verbally acknowledged these results. Electronically Signed   By: AMerilyn BabaM.D.   On: 03/07/2022 22:00   VAS [place]OWER EXTREMITY VENOUS (DVT) (7a-7p)  Result Date: 03/07/2022  Lower Venous DVT Study Patient Name:  Kelly [name] Date of Exam:   03/07/2022 Medical Rec #: [id number]      Accession #:    [id number] of Birth: [birth year]     Patient Gender: F Patient Age:   89years [facility]      VAS [place]OWER EXTREMITY VENOUS (DVT) Referring Phys: ADorise Bullion--------------------------------------------------------------------------------  Indications: Pain.   Risk Factors: Cancer. Limitations: Poor ultrasound/tissue interface. Comparison Study: No prior studies. Performing Technologist: GOliver HumRVT  Examination Guidelines: A complete evaluation includes B-mode imaging, spectral Doppler, color Doppler, and power Doppler as needed of all accessible portions of each vessel. Bilateral testing is considered an integral part of a complete examination. Limited examinations for reoccurring indications may be performed as noted. The reflux portion of the exam  is performed with the patient in reverse Trendelenburg.  +-----+---------------+---------+-----------+----------+--------------+ RIGHTCompressibilityPhasicitySpontaneityPropertiesThrombus Aging +-----+---------------+---------+-----------+----------+--------------+ CFV  Full           Yes      Yes                                 +-----+---------------+---------+-----------+----------+--------------+   +---------+---------------+---------+-----------+----------+--------------+ LEFT     CompressibilityPhasicitySpontaneityPropertiesThrombus Aging +---------+---------------+---------+-----------+----------+--------------+ CFV      Full           Yes      Yes                                 +---------+---------------+---------+-----------+----------+--------------+ SFJ      Full                                                        +---------+---------------+---------+-----------+----------+--------------+ FV Prox  Full                                                        +---------+---------------+---------+-----------+----------+--------------+ FV Mid   Full                                                        +---------+---------------+---------+-----------+----------+--------------+ FV DistalFull                                                        +---------+---------------+---------+-----------+----------+--------------+ PFV      Full                                                         +---------+---------------+---------+-----------+----------+--------------+ POP      Full           Yes      Yes                                 +---------+---------------+---------+-----------+----------+--------------+ PTV      Full                                                        +---------+---------------+---------+-----------+----------+--------------+ PERO     Full                                                        +---------+---------------+---------+-----------+----------+--------------+  Summary: RIGHT: - No evidence of common femoral vein obstruction.  LEFT: - There is no evidence of deep vein thrombosis in the lower extremity.  - No cystic structure found in the popliteal fossa.  *See table(s) above for measurements and observations. Electronically signed by Deitra Mayo MD on 03/07/2022 at 5:08:36 PM.    Final    MR BRAIN W WO CONTRAST  Result Date: 03/03/2022 CLINICAL DATA:  Brain/CNS neoplasm, assess treatment response EXAM: MRI HEAD WITHOUT AND WITH CONTRAST TECHNIQUE: Multiplanar, multiecho pulse sequences of the brain and surrounding structures were obtained without and with intravenous contrast. CONTRAST:  49m GADAVIST GADOBUTROL 1 MMOL/ML IV SOLN COMPARISON:  MRI 12/31/2021. FINDINGS: Brain: Continued interval collapse of the enhancing mass centered in the anterior right temporal lobe with mildly decreased conspicuity of enhancement. No new or progressive enhancement. Mild surrounding T2/FLAIR hyperintensities in the adjacent white matter, suspicious for infiltrating neoplasm. No evidence of acute hemorrhage, acute infarct, midline shift or hydrocephalus. Vascular: Major arterial flow voids are maintained. Skull and upper cervical spine: No acute findings. Sinuses/Orbits: Clear sinuses.  No acute orbital findings. Other: No mastoid effusions. IMPRESSION: Continued slight contraction and decrease in size and  conspicuity of enhancement of the anterior right temporal lobe lesion. Similar surrounding T2/FLAIR hyperintensity. No new or progressive findings. Electronically Signed   By: FMargaretha SheffieldM.D.   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  Neurosurgery does not recommend consideration of resection.  The patient has started steroids with some mild improvement thus far and her weakness.  I discussed with the patient a potential palliative course of radiation treatment to the T6 level.  This is not a common scenario, but my initial recommendation would be to proceed with 30 Gray in 10 fractions.  I discussed with the patient that it is difficult to precisely estimate the likelihood or degree of improvement in strength.  We discussed that in general, the longer weakness is present and the more severe, the less likely significant recovery is.  It does appear that her weakness to some degree has been present for an extended period of time, although severe weakness has been present just for several days now since Wednesday.  The improvement thus far with steroids is somewhat encouraging, but I still remain concerned that significant weakness will persist even if we are able to achieve local control of this area given the context and timeframe that we are dealing with.  Nevertheless, the greatest chance of recovery certainly would be present with treatment initiated as soon as possible.  I did offer treatment to this area tonight.  The patient wishes to consider this option, sleeping on this and also discussing with her husband.  She is aware that we will discuss her case Monday morning and tumor board but we left it tonight that I would check with her tomorrow to see how she would like to proceed, initiating radiation treatment tomorrow versus waiting until the tumor board is able to be on Monday.   I do not anticipate that this discussion would alter the recommendation that I have provided with her, but the patient is aware that this is not a common scenario and I certainly understand where she is coming from.  I will check in again with her tomorrow and we will proceed accordingly.   The patient was seen  in person today in the hospital.  The total time spent on the patient's visit today was 50 minutes, including chart review, direct discussion/evaluation with the patient, and coordination of care.     ________________________________   Jodelle Gross, MD, PhD   **Disclaimer: This note was dictated with voice recognition software. Similar sounding words can inadvertently be transcribed and this note may contain transcription errors which may not have been corrected upon publication of note.**

## 2022-03-08 NOTE — Progress Notes (Signed)
Patient ID: Kelly [name], female   DOB: 1965/10/22, 57 y.o.   MRN: [id number] BP (!) 135/101 (BP Location: Left Arm)   Pulse (!) 106   Temp 98 F (36.7 C) (Oral)   Resp 14   Ht 5' 4"$  (1.626 m)   Wt 90.7 kg   SpO2 92%   BMI 34.32 kg/m  Films reviewed. Will speak with patient later today.

## 2022-03-08 NOTE — Progress Notes (Signed)
[facility] Progress Note  Patient: Kelly [name]     [id number]  DOA: 03/07/2022   PCP: [facility]       Brief hospital course: Kelly [name], a 57 year old female with a history of glioblastoma post craniotomy, undergoing chemotherapy and radiation, presents with sudden left lower extremity weakness and swelling noticed four days ago. She underwent Doppler ultrasound today due to the swelling. She experienced unusual sensations in the left lower extremity, with no prior history of clots. Examination revealed left lower extremity immobility and decreased sensation extending to the lower abdomen on the left side. A recent brain MRI showed no new masses, but MRI of the spine > intrathecal, dural based versus intramedullary mass in the spinal cord, centered at T6, which measures 11 x 14 x 21 mm, concerning for metastatic disease from the patient's glioblastoma. A this is associated with a syrinx extending superiorly from the mass to the level of T3-T4 and inferiorly to the level of T8-T9. 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  HTN  - Cont Lisinopril     Code Status: Full Code Consultants: rad onc, NS Level of Care: Level of care: Med-Surg Total time on patient care: 35 DVT prophylaxis:  enoxaparin  (LOVENOX) injection 40 mg Start: 03/08/22 1200 SCDs Start: 03/08/22 0204    Objective:   Vitals:   03/07/22 2215 03/08/22 0100 03/08/22 0105 03/08/22 0603  BP: (!) 133/96  (!) 155/97 (!) 139/91  Pulse: 95  90 100  Resp: 16  18 17  $ Temp:   (!) 97.4 F (36.3 C) 98.4 F (36.9 C)  TempSrc:   Oral Oral  SpO2: 100%  95% 90%  Weight:  90.7 kg    Height:  5' 4"$  (1.626 m)     Filed Weights   03/08/22 0100  Weight: 90.7 kg   Exam: General exam: Appears comfortable  HEENT: oral mucosa moist Respiratory system: Clear to auscultation.  Cardiovascular system: S1 & S2 heard  Gastrointestinal system: Abdomen soft, non-tender, nondistended. Normal bowel sounds   Extremities: No cyanosis, clubbing - left leg 0/5 - no sensation in foot and decreased sensation in leg Psychiatry:  Mood & affect appropriate.     CBC: Recent Labs  Lab 03/03/22 0935 03/07/22 1558 03/08/22 0319  WBC 6.0 5.8 6.7  NEUTROABS 3.9 4.2  --   HGB 14.5 13.9 14.1  HCT 41.1 41.7 41.2  MCV 102.2* 105.3* 103.5*  PLT 172 176 [id number]   Basic Metabolic Panel: Recent Labs  Lab 03/03/22 0935 03/07/22 1558 03/08/22 0319  NA 139 135 136  K 3.8 3.9 3.9  CL 104 99 101  CO2 26 27 24  $ GLUCOSE 109* 99 116*  BUN 10 13 10  $ CREATININE 0.67 0.79 0.68  CALCIUM 9.5 9.2 8.9   GFR: Estimated  Creatinine Clearance: 84.6 mL/min (by C-G formula based on SCr of 0.68 mg/dL).  Scheduled Meds:  amitriptyline  100 mg Oral QHS   cyclobenzaprine  5-10 mg Oral QHS   dexamethasone (DECADRON) injection  6 mg Intravenous Q6H   escitalopram  10 mg Oral QHS   lacosamide  100 mg Oral BID   levETIRAcetam  1,500 mg Oral BID   Continuous Infusions: Imaging and lab data was personally reviewed MR TOTAL SPINE METS SCREENING  Result Date: 03/07/2022 CLINICAL DATA:  Left leg swelling, unable to move left foot, history of glioblastoma chest EXAM: MRI TOTAL SPINE WITHOUT AND WITH CONTRAST TECHNIQUE: Multisequence MR imaging of the spine from  the cervical spine to the sacrum was performed prior to and following IV contrast administration for evaluation of spinal metastatic disease. CONTRAST:  4m GADAVIST GADOBUTROL 1 MMOL/ML IV SOLN COMPARISON:  None Available. FINDINGS: MRI CERVICAL SPINE FINDINGS Alignment: Physiologic. Vertebrae: No fracture, evidence of discitis, or bone lesion. No abnormal enhancement. Cord: Normal signal and morphology.  No abnormal enhancement. Posterior Fossa, vertebral arteries, paraspinal tissues: Please see MRI brain 02/28/2022 for intracranial findings. Disc levels: Limited imaging of the cervical spine demonstrates no significant spinal canal stenosis. MRI THORACIC SPINE FINDINGS Alignment:  No listhesis. Vertebrae: No fracture, evidence of discitis, or bone lesion. No abnormal enhancement. Cord: Enhancing, intrathecal, dural based or intramedullary lesion in the spinal cord, centered at T6, which measures 11 x 14 x 21 mm (AP x TR x CC) (series 24, image 24 and series 21, image 7). The mass extends towards the left neural foramen at T6-T7. This is associated with a syrinx extending superiorly from the mass to the level of T3-T4 and inferiorly to the level of T8-T9 (series 16, image 8). Minimal normal spinal cord signal is noted at the right aspect of the spinal canal (series 24, image 24). Somewhat nodular enhancement along the dorsal aspect of the spinal cord at T3 (series 24, image 8 and series 21, image 8), which may indicate additional leptomeningeal disease or be vascular. Paraspinal and other soft tissues: Not well evaluated due to respiratory motion artifact. Large hiatal hernia. Disc levels: No osseous spinal canal stenosis. MRI LUMBAR SPINE FINDINGS Segmentation:  5 lumbar type vertebral bodies. Alignment:  No significant listhesis. Vertebrae: No fracture, evidence of discitis, or bone lesion. No abnormal enhancement. Conus medullaris: Extends to the L1 level and appears normal. No abnormal enhancement. Paraspinal  and other soft tissues: No acute finding on the limited series. Disc levels: No osseous spinal canal stenosis. IMPRESSION: 1. Enhancing, intrathecal, dural based versus intramedullary mass in the spinal cord, centered at T6, which measures 11 x 14 x 21 mm, concerning for metastatic disease from the patient's glioblastoma. A this is associated with a syrinx extending superiorly from the mass to the level of T3-T4 and inferiorly to the level of T8-T9. 2. Additional somewhat nodular enhancement along the dorsal aspect of the spinal cord at T3, which may indicate additional leptomeningeal disease or be vascular. 3. No additional enhancing lesions are seen in the cervical, thoracic, or lumbar spine. These results were called by telephone at the time of interpretation on 03/07/2022 at 10:00 pm to [facility] who verbally acknowledged these results. Electronically Signed   By: AMerilyn BabaM.D.   On: 03/07/2022 22:00   VAS [place] EXTREMITY VENOUS (DVT) (7a-7p)  Result Date: 03/07/2022  Lower Venous DVT Study Patient Name:  Kelly [name] Date of Exam:   03/07/2022 Medical  Rec #: [id number]       Accession #:    [id number] Date of Birth: 04-18-1965      Patient Gender: F Patient Age:   69 years Exam Location:  [facility] Procedure:      VAS [place] LOWER EXTREMITY VENOUS (DVT) Referring Phys: Dorise Bullion --------------------------------------------------------------------------------  Indications: Pain.  Risk Factors: Cancer. Limitations: Poor ultrasound/tissue interface. Comparison Study: No prior studies. Performing Technologist: Oliver Hum RVT  Examination Guidelines: A complete evaluation includes B-mode imaging, spectral Doppler, color Doppler, and power Doppler as needed of all accessible portions of each vessel. Bilateral testing is considered an integral part of a complete examination. Limited examinations for reoccurring indications may be performed as noted. The reflux  portion of the exam is performed with the patient in reverse Trendelenburg.  +-----+---------------+---------+-----------+----------+--------------+ RIGHTCompressibilityPhasicitySpontaneityPropertiesThrombus Aging +-----+---------------+---------+-----------+----------+--------------+ CFV  Full           Yes      Yes                                 +-----+---------------+---------+-----------+----------+--------------+   +---------+---------------+---------+-----------+----------+--------------+ LEFT     CompressibilityPhasicitySpontaneityPropertiesThrombus Aging +---------+---------------+---------+-----------+----------+--------------+ CFV      Full           Yes      Yes                                 +---------+---------------+---------+-----------+----------+--------------+ SFJ      Full                                                        +---------+---------------+---------+-----------+----------+--------------+ FV Prox  Full                                                        +---------+---------------+---------+-----------+----------+--------------+ FV Mid   Full                                                        +---------+---------------+---------+-----------+----------+--------------+ FV DistalFull                                                        +---------+---------------+---------+-----------+----------+--------------+ PFV      Full                                                        +---------+---------------+---------+-----------+----------+--------------+ POP      Full           Yes      Yes                                 +---------+---------------+---------+-----------+----------+--------------+  PTV      Full                                                        +---------+---------------+---------+-----------+----------+--------------+ PERO     Full                                                         +---------+---------------+---------+-----------+----------+--------------+    Summary: RIGHT: - No evidence of common femoral vein obstruction.  LEFT: - There is no evidence of deep vein thrombosis in the lower extremity.  - No cystic structure found in the popliteal fossa.  *See table(s) above for measurements and observations. Electronically signed by Deitra Mayo MD on 03/07/2022 at 5:08:36 PM.    Final     LOS: 1 day   Author: Debbe Odea  03/08/2022 9:40 AM  To contact [facility]>   Check the care team in [facility] and look for the attending/consulting [facility] provider listed  Log into [email or web address] and use [place]'s universal password   Go to> "[facility]"  and find provider  If you still have difficulty reaching the provider, please page the [facility] (Director on Call) for the Hospitalists listed on amion

## 2022-03-09 ENCOUNTER — Ambulatory Visit (HOSPITAL_COMMUNITY): Payer: Medicaid Other

## 2022-03-09 ENCOUNTER — Encounter (HOSPITAL_COMMUNITY): Payer: Self-pay

## 2022-03-09 DIAGNOSIS — C7951 Secondary malignant neoplasm of bone: Secondary | ICD-10-CM | POA: Diagnosis not present

## 2022-03-09 MED ORDER — POLYETHYLENE GLYCOL 3350 17 G PO PACK
17.0000 g | PACK | Freq: Every day | ORAL | Status: DC
  Administered 2022-03-09 – 2022-03-12 (×4): 17 g via ORAL
  Filled 2022-03-09 (×4): qty 1

## 2022-03-09 MED ORDER — SENNA 8.6 MG PO TABS
1.0000 | ORAL_TABLET | Freq: Every day | ORAL | Status: DC
  Administered 2022-03-09 – 2022-03-12 (×4): 8.6 mg via ORAL
  Filled 2022-03-09 (×4): qty 1

## 2022-03-09 MED ORDER — BISACODYL 10 MG RE SUPP
10.0000 mg | Freq: Every day | RECTAL | Status: DC | PRN
  Administered 2022-03-12: 10 mg via RECTAL
  Filled 2022-03-09: qty 1

## 2022-03-09 NOTE — Evaluation (Signed)
Physical Therapy Evaluation Patient Details Name: Kelly [name] MRN: [id number] DOB: 13-Jul-1965 Today's Date: 03/09/2022  History of Present Illness  Patient is a 57 year old female who presented with 4 day history of left lower extremity swelling and weakness. 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 [id number], HTN, obesity, tobacco abuse, hyperlipidemia.  Clinical Impression  Pt admitted with above diagnosis.  Pt currently with functional limitations due to the deficits listed below (see PT Problem List). Pt will benefit from skilled PT to increase their independence and safety with mobility to allow discharge to the venue listed below.  Pt very pleasant and cooperative.  Pt requiring cues for safety and technique.  Pt assisted with transferring to/from wheelchair as this will be her safest way to mobilize upon d/c.  Pt prefers d/c home however would need physical assist for mobility.  Recommend AIR upon d/c to assist pt and spouse with further education for safe mobility upon return home.  If pt does d/c home, she would benefit from a wheelchair.        Recommendations for follow up therapy are one component of a multi-disciplinary discharge planning process, led by the attending physician.  Recommendations may be updated based on patient status, additional functional criteria and insurance authorization.  Follow Up Recommendations Acute inpatient rehab (3hours/day)      Assistance Recommended at Discharge Intermittent Supervision/Assistance  Patient can return home with the following  A lot of help with walking and/or transfers;A lot of help with bathing/dressing/bathroom;Help with stairs or ramp for entrance;Assistance with cooking/housework;Assist for transportation    Equipment Recommendations Wheelchair cushion (measurements PT);Wheelchair (measurements PT)  Recommendations for Other Services        Functional Status Assessment Patient has had a recent decline in their functional status and demonstrates the ability to make significant improvements in function in a reasonable and predictable amount of time.     Precautions / Restrictions Precautions Precautions: Fall;Back Precaution Comments: L LE hemplegia (0/5 strength throughout) Restrictions Weight Bearing Restrictions: No      Mobility  Bed Mobility               General bed mobility comments: pt in recliner    Transfers Overall transfer level: Needs assistance   Transfers: Bed to chair/wheelchair/BSC       Squat pivot transfers: Min assist, +2 safety/equipment     General transfer comment: pt with no strength in Lt LE so blocked knee for safety; transfers with chairs 45* angles so pt can use UEs on armrests to assist; performed recliner to w/c, w/c to [facility], BSC to recliner    Ambulation/Gait                  Hotel manager mobility: Yes Wheelchair propulsion: Both upper extremities Wheelchair parts: Needs assistance Distance: 80 (limited due to need to void) Wheelchair Assistance Details (indicate cue type and reason): cues for brakes and using leg rest for L LE, cues for propelling and turning w/c, assist for w/c with tight spaces  Modified Rankin (Stroke Patients Only)       Balance           Standing balance support: Bilateral upper extremity supported, During functional activity, Reliant on assistive device for balance Standing balance-Leahy Scale: Poor Standing balance comment: reliant on UE support as L LE  has no strength or tone to weight bear                             Pertinent Vitals/Pain Pain Assessment Pain Assessment: No/denies pain    Home Living Family/patient expects to be discharged to:: Private residence Living Arrangements: Spouse/significant other Available Help at Discharge:  Family;Available 24 hours/day Type of Home: House Home Access: Stairs to enter Entrance Stairs-Rails: Right Entrance Stairs-Number of Steps: 5 brick steps   Home Layout: One level Home Equipment: Conservation officer, nature (2 wheels) Additional Comments: husband is able to help as needed per pt report    Prior Function Prior Level of Function : Independent/Modified Independent               ADLs Comments: works full time as Chiropodist as residential Herbalist   Dominant Hand: Right    Extremity/Trunk Assessment        Lower Extremity Assessment Lower Extremity Assessment: LLE deficits/detail LLE Deficits / Details: 0/5 throughout, only able to wiggle her toes; reports hyperesthesia    Cervical / Trunk Assessment Cervical / Trunk Assessment: Normal  Communication   Communication: No difficulties  Cognition   Behavior During Therapy: WFL for tasks assessed/performed Overall Cognitive Status: Difficult to assess                                 General Comments: patient is plesant and cooperative. appeared to have poor insight to LLE deficits at this time, requires cues for technique and safety as well as waiting for therapist to set up for transfers prior to initiating        General Comments      Exercises     Assessment/Plan    PT Assessment Patient needs continued PT services  PT Problem List         PT Treatment Interventions DME instruction;Therapeutic exercise;Balance training;Functional mobility training;Patient/family education;Therapeutic activities;Wheelchair mobility training;Neuromuscular re-education    PT Goals (Current goals can be found in the Care Plan section)  Acute Rehab PT Goals PT Goal Formulation: With patient Time For Goal Achievement: 03/23/22 Potential to Achieve Goals: Good    Frequency Min 3X/week     Co-evaluation               AM-PAC PT "6 Clicks" Mobility  Outcome Measure Help needed  turning from your back to your side while in a flat bed without using bedrails?: A Lot Help needed moving from lying on your back to sitting on the side of a flat bed without using bedrails?: A Lot Help needed moving to and from a bed to a chair (including a wheelchair)?: A Lot Help needed standing up from a chair using your arms (e.g., wheelchair or bedside chair)?: A Lot Help needed to walk in hospital room?: Total Help needed climbing 3-5 steps with a railing? : Total 6 Click Score: 10    End of Session Equipment Utilized During Treatment: Gait belt Activity Tolerance: Patient tolerated treatment well Patient left: in chair;with call bell/phone within reach;with chair alarm set Nurse Communication: Mobility status PT Visit Diagnosis: Other abnormalities of gait and mobility (R26.89);Other symptoms and signs involving the nervous system (R29.898);Hemiplegia and hemiparesis Hemiplegia - Right/Left: Left Hemiplegia - caused by:  (mass in spinal cord centered at T6)    Time: [id number] PT Time Calculation (min) (ACUTE  ONLY): 31 min   Charges:   PT Evaluation $PT Eval Moderate Complexity: 1 Mod PT Treatments $Therapeutic Activity: 8-22 mins      Jannette Spanner PT, DPT Physical Therapist Acute Rehabilitation Services Preferred contact method: Secure Chat Weekend Pager Only: [phone] Office: [place] 03/09/2022, 1:35 PM

## 2022-03-09 NOTE — Progress Notes (Signed)
Inpatient Rehab Admissions Coordinator:  ? ?Per therapy recommendations,  patient was screened for CIR candidacy by Demarques Pilz, MS, CCC-SLP. At this time, Pt. Appears to be a a potential candidate for CIR. I will place   order for rehab consult per protocol for full assessment. Please contact me any with questions. ? ?Jaylanie Boschee, MS, CCC-SLP ?Rehab Admissions Coordinator  ?[phone] (celll) ?[phone] (office) ? ?

## 2022-03-09 NOTE — Evaluation (Addendum)
Occupational Therapy Evaluation Patient Details Name: Kelly [name] MRN: [id number] DOB: 05-31-1965 Today's Date: 03/09/2022   History of Present Illness Patient is a 57 year old female who presented with 4 day history of left lower extremity swelling and weakness. 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 [id number], HTN, obesity, tobacco abuse, hyperlipidemia.   Clinical Impression   Patient is a 57 year old female who was admitted for above. Patient was living at home with husband while still working. Patient was educated on back precautions and importance of adherence to them with consistent cues needed for safety and maintaining precautions during session. Patient reported plan was to d/c home with family support. Patient indicated that wheelchair could fit through house. Patient was noted to have decreased functional activity tolernace, decreased AROM of LLE, decreased endurance, decreased sitting balance, decreased standing balanced, decreased safety awareness, and decreased knowledge of AE/AD impacting participation in ADLs. Patient would continue to benefit from skilled OT services at this time while admitted and after d/c to address noted deficits in order to improve overall safety and independence in ADLs.        Recommendations for follow up therapy are one component of a multi-disciplinary discharge planning process, led by the attending physician.  Recommendations may be updated based on patient status, additional functional criteria and insurance authorization.   Follow Up Recommendations  Acute Inpatient Rehab    Assistance Recommended at Discharge Frequent or constant Supervision/Assistance  Patient can return home with the following Two people to help with walking and/or transfers;A lot of help with bathing/dressing/bathroom;Assistance with cooking/housework;Direct supervision/assist for  medications management;Assist for transportation;Help with stairs or ramp for entrance;Direct supervision/assist for financial management    Functional Status Assessment  Patient has had a recent decline in their functional status and demonstrates the ability to make significant improvements in function in a reasonable and predictable amount of time.  Equipment Recommendations  Wheelchair cushion (measurements OT);Wheelchair (measurements OT) (drop arm BSC)       Precautions / Restrictions Precautions Precautions: Fall;Back Precaution Booklet Issued: No Precaution Comments: LLE buckles,back precautions Restrictions Weight Bearing Restrictions: No      Mobility Bed Mobility Overal bed mobility: Needs Assistance Bed Mobility: Supine to Sit     Supine to sit: Mod assist     General bed mobility comments: patient needed physical A to move LLE with consistent cues for proper body mechanics to avoid breaking back precautions    Transfers Overall transfer level: Needs assistance Equipment used: Rolling walker (2 wheels) Transfers: Sit to/from Stand Sit to Stand: Min assist, +2 physical assistance, +2 safety/equipment                  Balance Overall balance assessment: Needs assistance Sitting-balance support: Feet supported Sitting balance-Leahy Scale: Fair     Standing balance support: Bilateral upper extremity supported, Reliant on assistive device for balance Standing balance-Leahy Scale: Zero Standing balance comment: noted to have LLE buckling                           ADL either performed or assessed with clinical judgement   ADL Overall ADL's : Needs assistance/impaired Eating/Feeding: Set up;Sitting   Grooming: Applying deodorant;Wash/dry face;Wash/dry hands;Supervision/safety;Bed level   Upper Body Bathing: Set up;Bed level   Lower Body Bathing: Total assistance;Bed level   Upper Body Dressing : Set up;Bed level   Lower  Body Dressing:  Bed level;Total assistance Lower Body Dressing Details (indicate cue type and reason): patient needed mod A for rolling to each side in bed. paient was able to cross RLE in figure four and don sock. Toilet Transfer: Minimal assistance;+2 for physical assistance;+2 for safety/equipment Toilet Transfer Details (indicate cue type and reason): with STEDY. patient unable to advance LLE with poor awareness of location on floor wiht patient attempting to cross feet and step with RLE over LLE with multiple attempts to weight shift and help advance LLE with RW. Toileting- Clothing Manipulation and Hygiene: Total assistance;Bed level               Vision Baseline Vision/History: 1 Wears glasses              Pertinent Vitals/Pain Pain Assessment Pain Assessment: No/denies pain     Hand Dominance Right   Extremity/Trunk Assessment Upper Extremity Assessment Upper Extremity Assessment: Overall WFL for tasks assessed   Lower Extremity Assessment Lower Extremity Assessment: Defer to PT evaluation (LLE no AROM at hip/knee able to wiggle toes, reports numbness/pins/needels in saddle area down.)   Cervical / Trunk Assessment Cervical / Trunk Assessment: Normal   Communication Communication Communication: No difficulties   Cognition Arousal/Alertness: Awake/alert Behavior During Therapy: WFL for tasks assessed/performed Overall Cognitive Status: Difficult to assess     General Comments: patient is plesant and cooperative. patient reported today was feb 17th. noted to have to repeat things multiple times during session. appeared to have poor insight to LLE deficits at this time.                Home Living Family/patient expects to be discharged to:: Private residence Living Arrangements: Spouse/significant other Available Help at Discharge: Family;Available 24 hours/day Type of Home: House Home Access: Stairs to enter [employer] of Steps: 5 brick steps Entrance  Stairs-Rails: Right Home Layout: One level     Bathroom Shower/Tub: Tub/shower unit         Home Equipment: Conservation officer, nature (2 wheels)   Additional Comments: husband is able to help as needed,      Prior Functioning/Environment Prior Level of Function : Independent/Modified Independent               ADLs Comments: works full time as Chiropodist as Museum/gallery conservator Problem List: Decreased activity tolerance;Impaired balance (sitting and/or standing);Decreased coordination;Decreased safety awareness;Decreased knowledge of precautions;Decreased knowledge of use of DME or AE;Decreased range of motion      OT Treatment/Interventions: Self-care/ADL training;Energy conservation;Therapeutic exercise;DME and/or AE instruction;Therapeutic activities;Patient/family education;Balance training    OT Goals(Current goals can be found in the care plan section) Acute Rehab OT Goals Patient Stated Goal: to get LLE better and go home OT Goal Formulation: Patient unable to participate in goal setting Time For Goal Achievement: 03/23/22 Potential to Achieve Goals: Fair  OT Frequency: Min 2X/week       AM-PAC OT "6 Clicks" Daily Activity     Outcome Measure Help from another person eating meals?: A Little Help from another person taking care of personal grooming?: A Little Help from another person toileting, which includes using toliet, bedpan, or urinal?: Total Help from another person bathing (including washing, rinsing, drying)?: Total Help from another person to put on and taking off regular upper body clothing?: A Little Help from another person to put on and taking off regular lower body clothing?: A Lot 6 Click Score: 13   End of  Session Equipment Utilized During Treatment: Gait belt;Other (comment);Rolling walker (2 wheels) (STEDY) Nurse Communication: Other (comment) (ok to participate in session)  Activity Tolerance: Patient tolerated treatment well Patient  left: in chair;with call bell/phone within reach;with chair alarm set  OT Visit Diagnosis: Unsteadiness on feet (R26.81);Other abnormalities of gait and mobility (R26.89);Muscle weakness (generalized) (M62.81);Other symptoms and signs involving the nervous system (R29.898)                Time: 0820-0903 OT Time Calculation (min): 43 min Charges:  OT General Charges $OT Visit: 1 Visit OT Evaluation $OT Eval Moderate Complexity: 1 Mod OT Treatments $Self Care/Home Management : 23-37 mins  Trude Cansler OTR/L, MS Acute Rehabilitation Department Office# [phone]   Willa Rough 03/09/2022, 9:17 AM

## 2022-03-09 NOTE — Progress Notes (Signed)
[facility] Progress Note  Patient: Kelly [name]     [id number]  DOA: 03/07/2022   PCP: [facility]       Brief hospital course: Kelly [name], a 57 year old female with a history of glioblastoma post craniotomy, undergoing chemotherapy and radiation, presents with sudden left lower extremity weakness and swelling noticed four days ago. She underwent Doppler ultrasound today due to the swelling. She experienced unusual sensations in the left lower extremity, with no prior history of clots. Examination revealed left lower extremity immobility and decreased sensation extending to the lower abdomen on the left side. A recent brain MRI showed no new masses, but MRI of the spine > intrathecal, dural based versus intramedullary mass in the spinal cord, centered at T6, which measures 11 x 14 x 21 mm, concerning for metastatic disease from the patient's glioblastoma. A this is associated with a syrinx extending superiorly from the mass to the level of T3-T4 and inferiorly to the level of T8-T9. 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  Subjective:  She is able to move her toes now and is happy about this.  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  HTN  - Cont Lisinopril     Code Status: Full Code Consultants: rad onc, NS Level of Care: Level of care: Med-Surg Total time on patient care: 35 DVT prophylaxis:  enoxaparin (LOVENOX) injection 40 mg Start:  03/08/22 1200 SCDs Start: 03/08/22 0204    Objective:   Vitals:   03/08/22 1000 03/08/22 1355 03/08/22 2209 03/09/22 0608  BP: (!) 135/101 (!) 143/94 (!) 135/92 (!) 131/98  Pulse: (!) 106 (!) 103 99 91  Resp: 14 17 15 16  $ Temp: 98 F (36.7 C) 98.9 F (37.2 C) 98.4 F (36.9 C) 97.7 F (36.5 C)  TempSrc: Oral  Oral Oral  SpO2: 92% 92% 94% 96%  Weight:      Height:       Filed Weights   03/08/22 0100  Weight: 90.7 kg   Exam: General exam: Appears comfortable  HEENT: oral mucosa moist Respiratory system: Clear to auscultation.  Cardiovascular system: S1 & S2 heard  Gastrointestinal system: Abdomen soft, non-tender, nondistended. Normal bowel sounds   Extremities: No cyanosis, clubbing - left leg 0/5 - no sensation in foot and decreased sensation in leg Psychiatry:  Mood & affect appropriate.     CBC: Recent Labs  Lab 03/03/22 0935 03/07/22 1558 03/08/22 0319  WBC 6.0 5.8 6.7  NEUTROABS 3.9 4.2  --   HGB 14.5 13.9 14.1  HCT 41.1 41.7 41.2  MCV 102.2* 105.3* 103.5*  PLT 172 176 [id number]    Basic Metabolic Panel: Recent Labs  Lab 03/03/22 0935 03/07/22 1558 03/08/22 0319  NA 139 135 136  K 3.8 3.9 3.9  CL 104 99 101  CO2 26 27 24  $ GLUCOSE 109* 99 116*  BUN 10 13 10  $ CREATININE 0.67 0.79 0.68  CALCIUM 9.5  9.2 8.9    GFR: Estimated Creatinine Clearance: 84.6 mL/min (by C-G formula based on SCr of 0.68 mg/dL).  Scheduled Meds:  amitriptyline  100 mg Oral QHS   cyclobenzaprine  5-10 mg Oral QHS   dexamethasone (DECADRON) injection  6 mg Intravenous Q6H   diclofenac Sodium  2 g Topical QID   enoxaparin (LOVENOX) injection  40 mg Subcutaneous Q24H   escitalopram  10 mg Oral QHS   lacosamide  100 mg Oral BID   levETIRAcetam  1,500 mg Oral BID   lisinopril  10 mg Oral Daily   pantoprazole  40 mg Oral BID   polyethylene glycol  17 g Oral Daily   senna  1 tablet Oral QHS   Continuous Infusions: Imaging and lab data was personally reviewed MR TOTAL SPINE  METS SCREENING  Result Date: 03/07/2022 CLINICAL DATA:  Left leg swelling, unable to move left foot, history of glioblastoma chest EXAM: MRI TOTAL SPINE WITHOUT AND WITH CONTRAST TECHNIQUE: Multisequence MR imaging of the spine from the cervical spine to the sacrum was performed prior to and following IV contrast administration for evaluation of spinal metastatic disease. CONTRAST:  81m GADAVIST GADOBUTROL 1 MMOL/ML IV SOLN COMPARISON:  None Available. FINDINGS: MRI CERVICAL SPINE FINDINGS Alignment: Physiologic. Vertebrae: No fracture, evidence of discitis, or bone lesion. No abnormal enhancement. Cord: Normal signal and morphology.  No abnormal enhancement. Posterior Fossa, vertebral arteries, paraspinal tissues: Please see MRI brain 02/28/2022 for intracranial findings. Disc levels: Limited imaging of the cervical spine demonstrates no significant spinal canal stenosis. MRI THORACIC SPINE FINDINGS Alignment:  No listhesis. Vertebrae: No fracture, evidence of discitis, or bone lesion. No abnormal enhancement. Cord: Enhancing, intrathecal, dural based or intramedullary lesion in the spinal cord, centered at T6, which measures 11 x 14 x 21 mm (AP x TR x CC) (series 24, image 24 and series 21, image 7). The mass extends towards the left neural foramen at T6-T7. This is associated with a syrinx extending superiorly from the mass to the level of T3-T4 and inferiorly to the level of T8-T9 (series 16, image 8). Minimal normal spinal cord signal is noted at the right aspect of the spinal canal (series 24, image 24). Somewhat nodular enhancement along the dorsal aspect of the spinal cord at T3 (series 24, image 8 and series 21, image 8), which may indicate additional leptomeningeal disease or be vascular. Paraspinal and other soft tissues: Not well evaluated due to respiratory motion artifact. Large hiatal hernia. Disc levels: No osseous spinal canal stenosis. MRI LUMBAR SPINE FINDINGS Segmentation:  5 lumbar type  vertebral bodies. Alignment:  No significant listhesis. Vertebrae: No fracture, evidence of discitis, or bone lesion. No abnormal enhancement. Conus medullaris: Extends to the L1 level and appears normal. No abnormal enhancement. Paraspinal and other soft tissues: No acute finding on the limited series. Disc levels: No osseous spinal canal stenosis. IMPRESSION: 1. Enhancing, intrathecal, dural based versus intramedullary mass in the spinal cord, centered at T6, which measures 11 x 14 x 21 mm, concerning for metastatic disease from the patient's glioblastoma. A this is associated with a syrinx extending superiorly from the mass to the level of T3-T4 and inferiorly to the level of T8-T9. 2. Additional somewhat nodular enhancement along the dorsal aspect of the spinal cord at T3, which may indicate additional leptomeningeal disease or be vascular. 3. No additional enhancing lesions are seen in the cervical, thoracic, or lumbar spine. These results were called by telephone at the time of interpretation on  03/07/2022 at 10:00 pm to provider [facility] , who verbally acknowledged these results. Electronically Signed   By: Merilyn Baba M.D.   On: 03/07/2022 22:00   VAS [place] LOWER EXTREMITY VENOUS (DVT) (7a-7p)  Result Date: 03/07/2022  Lower Venous DVT Study Patient Name:  ORAL ROBLING  Date of Exam:   03/07/2022 Medical Rec #: [id number]       Accession #:    [id number] Date of Birth: 1965/12/14      Patient Gender: F Patient Age:   45 years Exam [facility]  [facility] Procedure:      VAS [place] LOWER EXTREMITY VENOUS (DVT) Referring Phys: Dorise Bullion --------------------------------------------------------------------------------  Indications: Pain.  Risk Factors: Cancer. Limitations: Poor ultrasound/tissue interface. Comparison Study: No prior studies. Performing Technologist: Oliver Hum RVT  Examination Guidelines: A complete evaluation includes B-mode imaging, spectral Doppler, color Doppler,  and power Doppler as needed of all accessible portions of each vessel. Bilateral testing is considered an integral part of a complete examination. Limited examinations for reoccurring indications may be performed as noted. The reflux portion of the exam is performed with the patient in reverse Trendelenburg.  +-----+---------------+---------+-----------+----------+--------------+ RIGHTCompressibilityPhasicitySpontaneityPropertiesThrombus Aging +-----+---------------+---------+-----------+----------+--------------+ CFV  Full           Yes      Yes                                 +-----+---------------+---------+-----------+----------+--------------+   +---------+---------------+---------+-----------+----------+--------------+ LEFT     CompressibilityPhasicitySpontaneityPropertiesThrombus Aging +---------+---------------+---------+-----------+----------+--------------+ CFV      Full           Yes      Yes                                 +---------+---------------+---------+-----------+----------+--------------+ SFJ      Full                                                        +---------+---------------+---------+-----------+----------+--------------+ FV Prox  Full                                                        +---------+---------------+---------+-----------+----------+--------------+ FV Mid   Full                                                        +---------+---------------+---------+-----------+----------+--------------+ FV DistalFull                                                        +---------+---------------+---------+-----------+----------+--------------+ PFV      Full                                                        +---------+---------------+---------+-----------+----------+--------------+  POP      Full           Yes      Yes                                  +---------+---------------+---------+-----------+----------+--------------+ PTV      Full                                                        +---------+---------------+---------+-----------+----------+--------------+ PERO     Full                                                        +---------+---------------+---------+-----------+----------+--------------+    Summary: RIGHT: - No evidence of common femoral vein obstruction.  LEFT: - There is no evidence of deep vein thrombosis in the lower extremity.  - No cystic structure found in the popliteal fossa.  *See table(s) above for measurements and observations. Electronically signed by Deitra Mayo MD on 03/07/2022 at 5:08:36 PM.    Final     LOS: 2 days   Author: Debbe Odea  03/09/2022 12:35 PM  To contact [facility]>   Check the care team in [facility] and look for the attending/consulting [facility] provider listed  Log into [email or web address] and use [place] universal password   Go to> "[facility]"  and find provider  If you still have difficulty reaching the provider, please page the [facility] (Director on Call) for the Hospitalists listed on amion

## 2022-03-09 NOTE — Progress Notes (Signed)
Orthopedic Tech Progress Note Patient Details:  [name] Mar 01, 1965 [id number]  Patient ID: Kelly [name], female   DOB: 1965-05-26, 57 y.o.   MRN: [id number] Brace ordered. Edwina Barth 03/09/2022, 2:44 PM

## 2022-03-09 NOTE — Progress Notes (Signed)
Orthopedic Tech Progress Note Patient Details:  Kelly [name] 12/11/1965 [id number]  [place] [facility] aware of walking prafo order.  Patient ID: Kelly [name], female   DOB: 12-14-1965, 57 y.o.   MRN: [id number]  [name] 03/09/2022, 2:43 PM

## 2022-03-10 ENCOUNTER — Ambulatory Visit
Admission: RE | Admit: 2022-03-10 | Discharge: 2022-03-10 | Disposition: A | Discharge status: Home / Self Care | Payer: Medicaid Other | Source: Ambulatory Visit | Attending: Radiation Oncology | Admitting: Radiation Oncology

## 2022-03-10 DIAGNOSIS — C719 Malignant neoplasm of brain, unspecified: Secondary | ICD-10-CM

## 2022-03-10 DIAGNOSIS — R531 Weakness: Secondary | ICD-10-CM | POA: Diagnosis not present

## 2022-03-10 DIAGNOSIS — C7951 Secondary malignant neoplasm of bone: Secondary | ICD-10-CM | POA: Diagnosis not present

## 2022-03-10 DIAGNOSIS — I10 Essential (primary) hypertension: Secondary | ICD-10-CM

## 2022-03-10 DIAGNOSIS — C712 Malignant neoplasm of temporal lobe: Secondary | ICD-10-CM | POA: Insufficient documentation

## 2022-03-10 DIAGNOSIS — Z510 Encounter for antineoplastic radiation therapy: Secondary | ICD-10-CM | POA: Insufficient documentation

## 2022-03-10 MED ORDER — HYDROMORPHONE HCL 1 MG/ML IJ SOLN: 1.0000 mg | INTRAMUSCULAR | Status: DC | PRN

## 2022-03-10 MED ORDER — GUAIFENESIN-DM 100-10 MG/5ML PO SYRP
5.0000 mL | ORAL_SOLUTION | ORAL | Status: DC | PRN
  Administered 2022-03-10: 5 mL via ORAL
  Filled 2022-03-10: qty 10

## 2022-03-10 MED ORDER — OXYCODONE HCL 5 MG PO TABS
10.0000 mg | ORAL_TABLET | Freq: Every day | ORAL | Status: DC
  Administered 2022-03-10 – 2022-03-13 (×15): 10 mg via ORAL
  Filled 2022-03-10 (×15): qty 2

## 2022-03-10 NOTE — PMR Pre-admission (Signed)
PMR Admission Coordinator Pre-Admission Assessment  Patient: Kelly [name] is an 57 y.o., female MRN: [id number] DOB: 1965/07/04 Height: 5' 4"$  (162.6 cm) Weight: 90.7 kg  Insurance Information HMO: ***    PPO: ***     PCP: ***     IPA: ***     80/20: ***     OTHER: *** PRIMARY: Medicaid Healthy Blue      Policy#: [id number]      Subscriber: Patient CM Name: ***      Phone#: ***     Fax#: *** Pre-Cert#: ***      Employer: *** Benefits:  Phone #: ***     Name: *** Irene Shipper. Date: ***     Deduct: ***      Out of Pocket Max: ***      Life Max: *** CIR: ***      SNF: *** Outpatient: ***     Co-Pay: *** Home Health: ***      Co-Pay: *** DME: ***     Co-Pay: *** Providers: *** SECONDARY: ***      Policy#: ***     Phone#: ***  Financial Counselor: ***      Phone#: ***  The "Data Collection Information Summary" for patients in Inpatient Rehabilitation Facilities with attached "Privacy Act [place] Records" was provided and verbally reviewed with: {CHL IP Patient Family TW:[id number]  Emergency Contact Information Contact Information     Name Relation Home Work Mobile   Kelly [name],Kelly [name] Spouse   [phone]   Kelly [name]   [phone]       Current Medical History  Patient Admitting Diagnosis: Metastatic cancer to spine  History of Present Illness: Kelly [name], a 57 year old female with a history of glioblastoma post craniotomy, undergoing chemotherapy and radiation, presents with sudden left lower extremity weakness and swelling noticed four days ago. She underwent Doppler ultrasound today due to the swelling. She experienced unusual sensations in the left lower extremity, with no prior history of clots. Examination revealed left lower extremity immobility and decreased sensation extending to the lower abdomen on the left side. 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  Therapies are recommending intensive rehab.     Patient's medical record from [facility] has been reviewed by the rehabilitation admission coordinator and physician.  Past Medical History  Past Medical History:  Diagnosis Date   Arthritis    Cancer ([place])    Depression    Dyslipidemia    Essential hypertension 07/31/2015   Hypothyroidism 07/31/2015   doctor took her off medication    Has the patient had major surgery during 100 days prior to admission? No  Family History   family history includes Aneurysm in her father; Healthy in her daughter, mother, and sister; Heart disease in her son; Hyperlipidemia in her father; Hypertension in her father.  Current Medications  Current Facility-Administered Medications:    amitriptyline (ELAVIL) tablet 100 mg, 100 mg, Oral, QHS, Garba, Mohammad L, MD, 100 mg at 03/09/22 2134   bisacodyl (DULCOLAX) suppository 10 mg, 10 mg, Rectal, Daily PRN, Rizwan, Saima, MD   cyclobenzaprine (FLEXERIL) tablet 5-10 mg, 5-10 mg, Oral, QHS, Garba, Mohammad L, MD, 10 mg at 03/09/22 2135   dexamethasone (DECADRON) injection 6 mg, 6 mg, Intravenous, Q6H, Garba, Mohammad L, MD, 6 mg at 03/10/22 1340   diclofenac Sodium (VOLTAREN) 1 % topical gel 2 g, 2  g, Topical, QID, Debbe Odea, MD, 2 g at 03/10/22 1341   enoxaparin (LOVENOX) injection 40 mg, 40 mg, Subcutaneous, Q24H, Rizwan, Saima, MD, 40 mg at 03/10/22 1340   escitalopram (LEXAPRO) tablet 10 mg, 10 mg, Oral, QHS, Garba, Mohammad L, MD, 10 mg at 03/09/22 2134   guaiFENesin-dextromethorphan (ROBITUSSIN DM) 100-10 MG/5ML syrup 5 mL, 5 mL, Oral, Q4H PRN, Debbe Odea, MD, 5 mL at 03/10/22 1018   HYDROmorphone (DILAUDID) injection 1 mg, 1 mg, Intravenous, Q2H PRN, Debbe Odea, MD   lacosamide (VIMPAT) tablet 100 mg, 100 mg, Oral, BID, [facility], Mohammad L, MD, 100 mg at 03/10/22 1018   levETIRAcetam (KEPPRA) tablet 1,500 mg, 1,500 mg, Oral, BID, [facility], Mohammad L, MD, 1,500  mg at 03/10/22 1018   lisinopril (ZESTRIL) tablet 10 mg, 10 mg, Oral, Daily, Rizwan, Saima, MD, 10 mg at 03/09/22 2240   ondansetron (ZOFRAN) tablet 4 mg, 4 mg, Oral, Q6H PRN **OR** ondansetron (ZOFRAN) injection 4 mg, 4 mg, Intravenous, Q6H PRN, [facility], Mohammad L, MD   Oral care mouth rinse, 15 mL, Mouth Rinse, PRN, [facility], Mohammad L, MD   oxyCODONE (Oxy IR/ROXICODONE) immediate release tablet 10 mg, 10 mg, Oral, 5 X Daily, Rizwan, Saima, MD, 10 mg at 03/10/22 1340   pantoprazole (PROTONIX) EC tablet 40 mg, 40 mg, Oral, BID, Rizwan, Saima, MD, 40 mg at 03/10/22 1018   polyethylene glycol (MIRALAX / GLYCOLAX) packet 17 g, 17 g, Oral, Daily, Rizwan, Saima, MD, 17 g at 03/10/22 1019   senna (SENOKOT) tablet 8.6 mg, 1 tablet, Oral, QHS, Rizwan, Saima, MD, 8.6 mg at 03/09/22 2134  Patients Current Diet:  Diet Order             Diet regular Room service appropriate? Yes; Fluid consistency: Thin  Diet effective now                   Precautions / Restrictions Precautions Precautions: Fall, Back Precaution Booklet Issued: No Precaution Comments: L LE hemplegia Restrictions Weight Bearing Restrictions: No   Has the patient had 2 or more falls or a fall with injury in the past year? Yes  Prior Activity Level    Prior Functional Level Self Care: Did the patient need help bathing, dressing, using the toilet or eating? Needed some help  Indoor Mobility: Did the patient need assistance with walking from room to room (with or without device)? Needed some help  Stairs: Did the patient need assistance with internal or external stairs (with or without device)? Needed some help  Functional Cognition: Did the patient need help planning regular tasks such as shopping or remembering to take medications? Needed some help  Patient Information Are you of Hispanic, Latino/a,or Spanish origin?: A. No, not of Hispanic, Latino/a, or Spanish origin What is your race?: A. White Do you need or want  an interpreter to communicate with a doctor or health care staff?: 0. No  Patient's Response To:  Health Literacy and Transportation Is the patient able to respond to health literacy and transportation needs?: Yes Health Literacy - How often do you need to have someone help you when you read instructions, pamphlets, or other written material from your doctor or pharmacy?: Never In the past 12 months, has lack of transportation kept you from medical appointments or from getting medications?: No In the past 12 months, has lack of transportation kept you from meetings, work, or from getting things needed for daily living?: No  Development worker, international aid / [place] Devices/Equipment:  Blood pressure cuff, Shower chair without back, Other (Comment), Wheelchair (Reading Glasses) Home Equipment: Conservation officer, nature (2 wheels)  Prior Device Use: Indicate devices/aids used by the patient prior to current illness, exacerbation or injury? Manual wheelchair  Current Functional Level Cognition  Overall Cognitive Status: Impaired/Different from baseline Orientation Level: Oriented X4 General Comments: patient is plesant and cooperative. appears to have poor insight to LLE deficits at this time, requires cues for technique and safety as well as waiting for therapist to set up for transfers prior to initiating    Extremity Assessment (includes Sensation/Coordination)  Upper Extremity Assessment: Overall WFL for tasks assessed  Lower Extremity Assessment: LLE deficits/detail LLE Deficits / Details: 0/5 throughout, only able to wiggle her toes; reports hyperesthesia    ADLs  Overall ADL's : Needs assistance/impaired Eating/Feeding: Set up, Sitting Grooming: Applying deodorant, Wash/dry face, Wash/dry hands, Supervision/safety, Bed level Upper Body Bathing: Set up, Bed level Lower Body Bathing: Total assistance, Bed level Upper Body Dressing : Set up, Bed level Lower Body Dressing: Bed level,  Total assistance Lower Body Dressing Details (indicate cue type and reason): patient needed mod A for rolling to each side in bed. paient was able to cross RLE in figure four and don sock. Toilet Transfer: Minimal assistance, +2 for physical assistance, +2 for safety/equipment Toilet Transfer Details (indicate cue type and reason): with STEDY. patient unable to advance LLE with poor awareness of location on floor wiht patient attempting to cross feet and step with RLE over LLE with multiple attempts to weight shift and help advance LLE with RW. Toileting- Clothing Manipulation and Hygiene: Total assistance, Bed level    Mobility  Overal bed mobility: Needs Assistance Bed Mobility: Supine to Sit Supine to sit: Mod assist General bed mobility comments: pt in recliner    Transfers  Overall transfer level: Needs assistance Equipment used: Rolling walker (2 wheels) Transfers: Bed to chair/wheelchair/BSC Sit to Stand: Min assist, +2 physical assistance, +2 safety/equipment Bed to/from chair/wheelchair/BSC transfer type:: Squat pivot Squat pivot transfers: Min assist, +2 safety/equipment General transfer comment: pt with no strength in Lt LE so blocked knee for safety; transfers with chairs 45* angles so pt can use UEs on armrests to assist; performed recliner to w/c, w/c to [facility], BSC to recliner    Ambulation / Gait / Stairs / Environmental consultant mobility: Yes Wheelchair propulsion: Both upper extremities Wheelchair parts: Needs assistance Distance: 80 (limited due to need to void) Wheelchair Assistance Details (indicate cue type and reason): cues for brakes and using leg rest for L LE, cues for propelling and turning w/c, assist for w/c with tight spaces    Posture / Balance Balance Overall balance assessment: Needs assistance Sitting-balance support: Feet supported Sitting balance-Leahy Scale: Fair Standing balance support: Bilateral upper extremity  supported, During functional activity, Reliant on assistive device for balance Standing balance-Leahy Scale: Poor Standing balance comment: reliant on UE support as L LE has no strength or tone to weight bear    Special needs/care consideration Skin *** and Special service needs radiation   Previous Home Environment (from acute therapy documentation) Living Arrangements: Spouse/significant other  Lives With: Spouse Available Help at Discharge: Family, Available 24 hours/day Type of Home: House Home Layout: One level Home Access: Stairs to enter Entrance Stairs-Rails: Right Entrance Stairs-Number of Steps: 5 brick steps Bathroom Shower/Tub: Tub/shower unit Home Care Services: No Additional Comments: husband is able to help as needed per pt report  Discharge Living Setting Plans for Discharge  Living Setting: Patient's home, Lives with (comment) (spouse) Discharge Home Layout: One level Discharge Home Access: Stairs to enter Entrance Stairs-Rails: Right Entrance Stairs-Number of Steps: 5 Discharge Bathroom Shower/Tub: Tub/shower unit Does the patient have any problems obtaining your medications?: No  Social/Family/Support Systems Anticipated Caregiver: Spouse, Kelly [name] Anticipated Ambulance person Information: [phone] Caregiver Availability: 24/7 Is Caregiver In Agreement with Plan?: Yes Does Caregiver/Family have Issues with Lodging/Transportation while Pt is in Rehab?: No  Goals Patient/Family Goal for Rehab: min A OT/PT Expected length of stay: 7-10 days Pt/Family Agrees to Admission and willing to participate: Yes Program Orientation Provided & Reviewed with Pt/Caregiver Including Roles  & Responsibilities: Yes  Barriers to Discharge: Insurance for SNF coverage  Decrease burden of Care through IP rehab admission: Othern/a  Possible need for SNF placement upon discharge: not anticipated  Patient Condition: I have reviewed medical records from [place] [facility],  spoken with  TOC , and patient and spouse. I discussed via phone for inpatient rehabilitation assessment.  Patient will benefit from ongoing PT and OT, can actively participate in 3 hours of therapy a day 5 days of the week, and can make measurable gains during the admission.  Patient will also benefit from the coordinated team approach during an Inpatient Acute Rehabilitation admission.  The patient will receive intensive therapy as well as Rehabilitation physician, nursing, social worker, and care management interventions.  Due to safety, skin/wound care, disease management, medication administration, pain management, and patient education the patient requires 24 hour a day rehabilitation nursing.  The patient is currently *** with mobility and basic ADLs.  Discharge setting and therapy post discharge at [facility] IP discharge location:[id number]} is anticipated.  Patient has agreed to participate in the Acute Inpatient Rehabilitation Program and will admit {Time; today/tomorrow:10263}.  Preadmission Screen Completed By:  Nelly Laurence, 03/10/2022 2:07 PM ______________________________________________________________________   Discussed status with Dr. Marland Kitchen on *** at *** and received approval for admission today.  Admission Coordinator:  Nelly Laurence, time ***/Date ***   Assessment/Plan: Diagnosis: Does the need for close, 24 hr/day Medical supervision in concert with the patient's rehab needs make it unreasonable for this patient to be served in a less intensive setting? {yes_no_potentially:3041433} Co-Morbidities requiring supervision/potential complications: *** Due to {due RE:257123, does the patient require 24 hr/day rehab nursing? {yes_no_potentially:3041433} Does the patient require coordinated care of a physician, rehab nurse, PT, OT, and SLP to address physical and functional deficits in the context of the above medical diagnosis(es)? {yes_no_potentially:3041433} Addressing deficits in  the following areas: {deficits:3041436} Can the patient actively participate in an intensive therapy program of at least 3 hrs of therapy 5 days a week? {yes_no_potentially:3041433} The potential for patient to make measurable gains while on inpatient rehab is {potential:3041437} Anticipated functional outcomes upon discharge from inpatient rehab: {functional outcomes:304600100} PT, {functional outcomes:304600100} OT, {functional outcomes:304600100} SLP Estimated rehab length of stay to reach the above functional goals is: *** Anticipated discharge destination: {anticipated dc setting:21604} 10. Overall Rehab/Functional Prognosis: {potential:3041437}   MD Signature: ***
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
## 2022-03-10 NOTE — Consult Note (Signed)
[facility] Consult Note  Patient Care Team: [facility] as PCP - General (Internal Medicine)  CHIEF COMPLAINTS/PURPOSE OF CONSULTATION:  Left Leg weakness Glioblastoma  HISTORY OF PRESENTING ILLNESS:  Kelly [name] 57 y.o. female presented with weakness and loss of function with left leg.  Weakness came on relatively quickly, developing over just 2-3 days.  She is no longer able to walk on her own, currently requiring wheelchair prior to hospitalization.  Numbness is dense on the leg.  Weakness has now spread to involve her right leg, mildly.  Keppra continues at 1573m BID and Vimpat 536mBID, no further seizure events.  Inpatient decadron has helped very modestly.  MEDICAL HISTORY:  Past Medical History:  Diagnosis Date   Arthritis    [facility]   Depression    Dyslipidemia    Essential hypertension 07/31/2015   Hypothyroidism 07/31/2015   doctor took her off medication    SURGICAL HISTORY: Past Surgical History:  Procedure Laterality Date   BLADDER SUSPENSION     CRANIOTOMY Right 03/04/2021   Procedure: CRANIOTOMY TUMOR RESECTION W/ BRAIN LAB;  Surgeon: JeNewman PiesMD;  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[employer]   Smoking status: Every Day    Packs/day: 0.75    Years: 38.00    Total pack years: 28.50    Types: Cigarettes   Smokeless tobacco: Never  Substance and Sexual Activity   Alcohol use: No   Drug use: No   Sexual activity: Yes  Other Topics Concern   Not on file  Social History Narrative   Not on file   Social Determinants of Health    Financial Resource Strain: Low Risk  (08/19/2021)   Overall Financial Resource Strain (CARDIA)    Difficulty of Paying Living Expenses: Not very hard  Food Insecurity: No Food Insecurity (03/08/2022)   Hunger Vital Sign    Worried About Running Out of Food in the Last Year: Never true    Ran Out of Food in the Last Year: Never true  Transportation Needs: No Transportation Needs (03/08/2022)   PRAPARE - TrHydrologistMedical): No    Lack of Transportation (Non-Medical): No  Physical Activity: Not on file  Stress: Not on file  Social Connections: Moderately Isolated (08/19/2021)   Social Connection and Isolation Panel [NHANES]    Frequency of Communication with Friends and Family: More than three times a week    Frequency of Social Gatherings with Friends and Family: More than three times a week    Attends Religious Services: Never    AcMarine scientistr Organizations: No    Attends ClArchivisteetings: Never    Marital Status: Married  InHuman resources officeriolence: Not At Risk (03/08/2022)   Humiliation, Afraid, Rape, and Kick questionnaire    Fear of Current or Ex-Partner: No    Emotionally Abused: No    Physically Abused: No    Sexually Abused: No    FAMILY HISTORY: Family History  Problem Relation Age of  Onset   Healthy Mother    Aneurysm Father    Hypertension Father    Hyperlipidemia Father    Healthy Sister    Healthy Daughter    Heart disease Son     ALLERGIES:  is allergic to ranitidine.  MEDICATIONS:  Current Facility-Administered Medications  Medication Dose Route Frequency Provider Last Rate Last Admin   amitriptyline (ELAVIL) tablet 100 mg  100 mg Oral QHS Gala Romney L, MD   100 mg at 03/09/22 2134   bisacodyl (DULCOLAX) suppository 10 mg  10 mg Rectal Daily PRN Debbe Odea, MD       cyclobenzaprine (FLEXERIL) tablet 5-10 mg  5-10 mg Oral QHS Gala Romney L, MD   10 mg at 03/09/22 2135   dexamethasone  (DECADRON) injection 6 mg  6 mg Intravenous Q6H Elwyn Reach, MD   6 mg at 03/10/22 [id number]   diclofenac Sodium (VOLTAREN) 1 % topical gel 2 g  2 g Topical QID Debbe Odea, MD   2 g at 03/10/22 1019   enoxaparin (LOVENOX) injection 40 mg  40 mg Subcutaneous Q24H Debbe Odea, MD   40 mg at 03/09/22 1226   escitalopram (LEXAPRO) tablet 10 mg  10 mg Oral QHS Elwyn Reach, MD   10 mg at 03/09/22 2134   guaiFENesin-dextromethorphan (ROBITUSSIN DM) 100-10 MG/5ML syrup 5 mL  5 mL Oral Q4H PRN Debbe Odea, MD   5 mL at 03/10/22 1018   HYDROmorphone (DILAUDID) injection 1 mg  1 mg Intravenous Q2H PRN Elwyn Reach, MD   1 mg at 03/09/22 2141   lacosamide (VIMPAT) tablet 100 mg  100 mg Oral BID Gala Romney L, MD   100 mg at 03/10/22 1018   levETIRAcetam (KEPPRA) tablet 1,500 mg  1,500 mg Oral BID Gala Romney L, MD   1,500 mg at 03/10/22 1018   lisinopril (ZESTRIL) tablet 10 mg  10 mg Oral Daily Debbe Odea, MD   10 mg at 03/09/22 2240   ondansetron (ZOFRAN) tablet 4 mg  4 mg Oral Q6H PRN Elwyn Reach, MD       Or   ondansetron (ZOFRAN) injection 4 mg  4 mg Intravenous Q6H PRN Elwyn Reach, MD       Oral care mouth rinse  15 mL Mouth Rinse PRN Elwyn Reach, MD       pantoprazole (PROTONIX) EC tablet 40 mg  40 mg Oral BID Debbe Odea, MD   40 mg at 03/10/22 1018   polyethylene glycol (MIRALAX / GLYCOLAX) packet 17 g  17 g Oral Daily Debbe Odea, MD   17 g at 03/10/22 1019   senna (SENOKOT) tablet 8.6 mg  1 tablet Oral QHS Debbe Odea, MD   8.6 mg at 03/09/22 2134    REVIEW OF SYSTEMS:   Constitutional: Denies fevers, chills or abnormal weight loss Eyes: Denies blurriness of vision Ears, nose, mouth, throat, and face: Denies mucositis or sore throat Respiratory: Denies cough, dyspnea or wheezes Cardiovascular: Denies palpitation, chest discomfort or lower extremity swelling Gastrointestinal:  Denies nausea, constipation, diarrhea GU: Denies dysuria or  incontinence Skin: Denies abnormal skin rashes Neurological: Per HPI Musculoskeletal: Denies joint pain, back or neck discomfort. No decrease in ROM Behavioral/Psych: Denies anxiety, disturbance in thought content, and mood instability   PHYSICAL EXAMINATION: Vitals:   03/09/22 2035 03/10/22 0539  BP: 134/81 (!) 156/97  Pulse: 94 80  Resp: 16 16  Temp: 98.1 F (36.7 C) 97.9 F (36.6 C)  SpO2: 95% 93%   KPS: 60. General: Alert, cooperative, pleasant, in no acute distress Head: Normal EENT: No conjunctival injection or scleral icterus.  Lungs: Resp effort normal Cardiac: Regular rate Abdomen: Non-distended abdomen Skin: No rashes cyanosis or petechiae. Extremities: Left ankle orthotic   Neurologic Exam: Mental Status: Awake, alert, attentive to examiner. Oriented to self and environment. Language is fluent with intact comprehension.  Cranial Nerves: Visual acuity is grossly normal. Visual fields are full. Extra-ocular movements intact. No ptosis. Face is symmetric Motor: Tone and bulk are normal. Power is 1/5 in left leg, more weak with hip flexion and knee flexion, dorsiflexsion. Only very subtle drift in left arm, no frank weakness.  Right leg 4/5 with dorsiflexion in ankle. Reflexes are intact in left leg, no pathologic reflexes present.  Sensory: Intact to light touch Gait: Not independent   LABORATORY DATA:  I have reviewed the data as listed Lab Results  Component Value Date   WBC 6.7 03/08/2022   HGB 14.1 03/08/2022   HCT 41.2 03/08/2022   MCV 103.5 (H) 03/08/2022   PLT 172 03/08/2022   Recent Labs    12/09/21 1132 01/02/22 0936 03/03/22 0935 03/07/22 1558 03/08/22 0319  NA 139 143 139 135 136  K 3.9 3.9 3.8 3.9 3.9  CL 105 105 104 99 101  CO2 27 32 26 27 24  $ GLUCOSE 114* 114* 109* 99 116*  BUN 6 8 10 13 10  $ CREATININE 0.77 0.75 0.67 0.79 0.68  CALCIUM 9.5 9.6 9.5 9.2 8.9  GFRNONAA >60 >60 >60 >60 >60  PROT 6.8 6.2* 6.7  --   --   ALBUMIN 4.0 3.8  4.0  --   --   AST 19 18 13*  --   --   ALT 17 17 13  $ --   --   ALKPHOS 102 109 110  --   --   BILITOT 0.4 0.4 0.5  --   --     RADIOGRAPHIC STUDIES: I have personally reviewed the radiological images as listed and agreed with the findings in the report. MR TOTAL SPINE METS SCREENING  Result Date: 03/07/2022 CLINICAL DATA:  Left leg swelling, unable to move left foot, history of glioblastoma chest EXAM: MRI TOTAL SPINE WITHOUT AND WITH CONTRAST TECHNIQUE: Multisequence MR imaging of the spine from the cervical spine to the sacrum was performed prior to and following IV contrast administration for evaluation of spinal metastatic disease. CONTRAST:  45m GADAVIST GADOBUTROL 1 MMOL/ML IV SOLN COMPARISON:  None Available. FINDINGS: MRI CERVICAL SPINE FINDINGS Alignment: Physiologic. Vertebrae: No fracture, evidence of discitis, or bone lesion. No abnormal enhancement. Cord: Normal signal and morphology.  No abnormal enhancement. Posterior Fossa, vertebral arteries, paraspinal tissues: Please see MRI brain 02/28/2022 for intracranial findings. Disc levels: Limited imaging of the cervical spine demonstrates no significant spinal canal stenosis. MRI THORACIC SPINE FINDINGS Alignment:  No listhesis. Vertebrae: No fracture, evidence of discitis, or bone lesion. No abnormal enhancement. Cord: Enhancing, intrathecal, dural based or intramedullary lesion in the spinal cord, centered at T6, which measures 11 x 14 x 21 mm (AP x TR x CC) (series 24, image 24 and series 21, image 7). The mass extends towards the left neural foramen at T6-T7. This is associated with a syrinx extending superiorly from the mass to the level of T3-T4 and inferiorly to the level of T8-T9 (series 16, image 8). Minimal normal spinal cord signal is noted at the right aspect of the spinal canal (series 24, image  24). Somewhat nodular enhancement along the dorsal aspect of the spinal cord at T3 (series 24, image 8 and series 21, image 8), which may  indicate additional leptomeningeal disease or be vascular. Paraspinal and other soft tissues: Not well evaluated due to respiratory motion artifact. Large hiatal hernia. Disc levels: No osseous spinal canal stenosis. MRI LUMBAR SPINE FINDINGS Segmentation:  5 lumbar type vertebral bodies. Alignment:  No significant listhesis. Vertebrae: No fracture, evidence of discitis, or bone lesion. No abnormal enhancement. Conus medullaris: Extends to the L1 level and appears normal. No abnormal enhancement. Paraspinal and other soft tissues: No acute finding on the limited series. Disc levels: No osseous spinal canal stenosis. IMPRESSION: 1. Enhancing, intrathecal, dural based versus intramedullary mass in the spinal cord, centered at T6, which measures 11 x 14 x 21 mm, concerning for metastatic disease from the patient's glioblastoma. A this is associated with a syrinx extending superiorly from the mass to the level of T3-T4 and inferiorly to the level of T8-T9. 2. Additional somewhat nodular enhancement along the dorsal aspect of the spinal cord at T3, which may indicate additional leptomeningeal disease or be vascular. 3. No additional enhancing lesions are seen in the cervical, thoracic, or lumbar spine. These results were called by telephone at the time of interpretation on 03/07/2022 at 10:00 pm to provider [facility] , who verbally acknowledged these results. Electronically Signed   By: Merilyn Baba M.D.   On: 03/07/2022 22:00   VAS [place] LOWER EXTREMITY VENOUS (DVT) (7a-7p)  Result Date: 03/07/2022  Lower Venous DVT Study Patient Name:  Kelly [name]  Date of Exam:   03/07/2022 Medical Rec #: [id number]       Accession #:    [id number] Date of Birth: 1965-04-18      Patient Gender: F Patient Age:   59 years Exam Location:  [facility] Procedure:      VAS [place] LOWER EXTREMITY VENOUS (DVT) Referring Phys: Dorise Bullion --------------------------------------------------------------------------------   Indications: Pain.  Risk Factors: Cancer. Limitations: Poor ultrasound/tissue interface. Comparison Study: No prior studies. Performing Technologist: Oliver Hum RVT  Examination Guidelines: A complete evaluation includes B-mode imaging, spectral Doppler, color Doppler, and power Doppler as needed of all accessible portions of each vessel. Bilateral testing is considered an integral part of a complete examination. Limited examinations for reoccurring indications may be performed as noted. The reflux portion of the exam is performed with the patient in reverse Trendelenburg.  +-----+---------------+---------+-----------+----------+--------------+ RIGHTCompressibilityPhasicitySpontaneityPropertiesThrombus Aging +-----+---------------+---------+-----------+----------+--------------+ CFV  Full           Yes      Yes                                 +-----+---------------+---------+-----------+----------+--------------+   +---------+---------------+---------+-----------+----------+--------------+ LEFT     CompressibilityPhasicitySpontaneityPropertiesThrombus Aging +---------+---------------+---------+-----------+----------+--------------+ CFV      Full           Yes      Yes                                 +---------+---------------+---------+-----------+----------+--------------+ SFJ      Full                                                        +---------+---------------+---------+-----------+----------+--------------+  FV Prox  Full                                                        +---------+---------------+---------+-----------+----------+--------------+ FV Mid   Full                                                        +---------+---------------+---------+-----------+----------+--------------+ FV DistalFull                                                        +---------+---------------+---------+-----------+----------+--------------+ PFV       Full                                                        +---------+---------------+---------+-----------+----------+--------------+ POP      Full           Yes      Yes                                 +---------+---------------+---------+-----------+----------+--------------+ PTV      Full                                                        +---------+---------------+---------+-----------+----------+--------------+ PERO     Full                                                        +---------+---------------+---------+-----------+----------+--------------+    Summary: RIGHT: - No evidence of common femoral vein obstruction.  LEFT: - There is no evidence of deep vein thrombosis in the lower extremity.  - No cystic structure found in the popliteal fossa.  *See table(s) above for measurements and observations. Electronically signed by Deitra Mayo MD on 03/07/2022 at 5:08:36 PM.    Final    MR BRAIN W WO CONTRAST  Result Date: 03/03/2022 CLINICAL DATA:  Brain/CNS neoplasm, assess treatment response EXAM: MRI HEAD WITHOUT AND WITH CONTRAST TECHNIQUE: Multiplanar, multiecho pulse sequences of the brain and surrounding structures were obtained without and with intravenous contrast. CONTRAST:  74m GADAVIST GADOBUTROL 1 MMOL/ML IV SOLN COMPARISON:  MRI 12/31/2021. FINDINGS: Brain: Continued interval collapse of the enhancing mass centered in the anterior right temporal lobe with mildly decreased conspicuity of enhancement. No new or progressive enhancement. Mild surrounding T2/FLAIR hyperintensities in the adjacent white matter, suspicious for infiltrating neoplasm. No evidence of acute hemorrhage, acute infarct, midline shift or hydrocephalus. Vascular: Major arterial flow voids  are maintained. Skull and upper cervical spine: No acute findings. Sinuses/Orbits: Clear sinuses.  No acute orbital findings. Other: No mastoid effusions. IMPRESSION: Continued slight contraction and  decrease in size and conspicuity of enhancement of the anterior right temporal lobe lesion. Similar surrounding T2/FLAIR hyperintensity. No new or progressive findings. Electronically Signed   By: Margaretha Sheffield M.D.   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  Intraparenchymal cord metastasis at T5 is only visible lesion on screening protocol MRI.  This is leading directly to leg weakness, progressive on the left and milder on the right.    We extensively discussed  goals of care at bedside with patient and family.  They understand this presentation is incurable and changes her prognosis.  She would still like to move forward with life-prolonging treatments available to her.  After discussion in CNS tumor board, we recommended the following: -ASAP radiation to T5 and surrounding, plan for 30/10 dose and fractionation  -Agreeable to meet with palliative care team inpatient -Pending disposition -Resume 5-day Temodar at 250m/m2 once discharged home  Will con't to follow.  If discharged, can reduce decadron to 42mdaily.  All questions were answered. The patient knows to call the clinic with any problems, questions or concerns.  The total time spent in the encounter was 80 minutes and more than 50% was on counseling and review of test results     ZaVentura SellersMD 03/10/2022 10:31 AM

## 2022-03-10 NOTE — Progress Notes (Signed)
Inpatient Rehab Coordinator Note:  I spoke with patient by phone to discuss CIR recommendations and goals/expectations of CIR stay.  We reviewed 3 hrs/day of therapy, physician follow up, and average length of stay 2 weeks (dependent upon progress) with goals of min/mod A-wheelchair level.  Patient's family will provide 24/7 assistance upon her discharge home. Will send information to insurance for prior approval. Continue to follow.   Rehab Admissons Coordinator [place], [place], [place] [phone]

## 2022-03-10 NOTE — Progress Notes (Signed)
I met with the patient, her husband, and her daughter Kelly [name] in the simulation department. She will proceed with treatment planning today. She will start radiation on Wednesday, and will have her appts scheduled in her appt tab. She may go for inpt rehab, and would need transportation coordinated to [facility] for her treatments.

## 2022-03-10 NOTE — Progress Notes (Signed)
[facility] Progress Note  Patient: Kelly [name]     [id number]  DOA: 03/07/2022   PCP: [facility]       Brief hospital course: Kelly [name], a 57 year old female with a history of glioblastoma post craniotomy, undergoing chemotherapy and radiation, presents with sudden left lower extremity weakness and swelling noticed four days ago. She underwent Doppler ultrasound today due to the swelling. She experienced unusual sensations in the left lower extremity, with no prior history of clots. Examination revealed left lower extremity immobility and decreased sensation extending to the lower abdomen on the left side. A recent brain MRI showed no new masses, but MRI of the spine > intrathecal, dural based versus intramedullary mass in the spinal cord, centered at T6, which measures 11 x 14 x 21 mm, concerning for metastatic disease from the patient's glioblastoma. A this is associated with a syrinx extending superiorly from the mass to the level of T3-T4 and inferiorly to the level of T8-T9. 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  Subjective:  She can not move her foot at the ankle to the left and right. 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, T6 - prior to steroids> left leg 0/5 - no sensation in foot and decreased sensation in leg  - cont Decadron which is helping regain a small amount of movement - cont Keppra and Vimpat - she will get simulation tonight followed by 10 radiation treatments followed by chemo - patient and husband initially stated they will find the support for her to be at home rather than going to a SNF but would like her not to be at home until radiation is completed - CIR is looking into taking her- [facility] to see if they can transport her to radiation  - DME for home being arranged for later- Medicaid will no pay for a ramp and she has 5  stairs - add Oxy 10 mg 5 x day today and try to use IV Dilaudid only for breakthrough- cont laxatives  Active Problems:     Obesity, Class III, BMI 40-49.9 (morbid obesity) ([place])   - Body mass index is 34.32 kg/m.  HTN  - Cont Lisinopril     Code Status: Full Code Consultants: rad onc, NS Level of Care: Level of care: Med-Surg Total time on patient care: 35 DVT prophylaxis:  enoxaparin (LOVENOX) injection 40 mg Start: 03/08/22 1200 SCDs Start: 03/08/22 0204    Objective:   Vitals:   03/09/22 0608 03/09/22 1408 03/09/22 2035 03/10/22 0539  BP: (!) 131/98 126/83 134/81 (!) 156/97  Pulse: 91 98 94 80  Resp: 16 20 16 16  $ Temp: 97.7 F (36.5 C) (!) 97.5 F (36.4 C) 98.1 F (36.7 C) 97.9 F (36.6 C)  TempSrc: Oral Oral Oral Oral  SpO2: 96% 99% 95% 93%  Weight:      Height:       Filed Weights   03/08/22 0100  Weight: 90.7 kg   Exam: General exam: Appears comfortable  HEENT: oral mucosa moist Respiratory system: Clear to auscultation.  Cardiovascular system: S1 & S2 heard  Gastrointestinal system: Abdomen soft, non-tender, nondistended. Normal bowel sounds   Extremities: No cyanosis, clubbing or edema Neuro: left leg 1/5 Psychiatry:  Mood & affect appropriate.       CBC: Recent Labs  Lab 03/07/22 1558 03/08/22 0319  WBC 5.8 6.7  NEUTROABS 4.2  --   HGB 13.9  14.1  HCT 41.7 41.2  MCV 105.3* 103.5*  PLT 176 [id number]    Basic Metabolic Panel: Recent Labs  Lab 03/07/22 1558 03/08/22 0319  NA 135 136  K 3.9 3.9  CL 99 101  CO2 27 24  GLUCOSE 99 116*  BUN 13 10  CREATININE 0.79 0.68  CALCIUM 9.2 8.9    GFR: Estimated Creatinine Clearance: 84.6 mL/min (by C-G formula based on SCr of 0.68 mg/dL).  Scheduled Meds:  amitriptyline  100 mg Oral QHS   cyclobenzaprine  5-10 mg Oral QHS   dexamethasone (DECADRON) injection  6 mg Intravenous Q6H   diclofenac Sodium  2 g Topical QID   enoxaparin (LOVENOX) injection  40 mg Subcutaneous Q24H   escitalopram   10 mg Oral QHS   lacosamide  100 mg Oral BID   levETIRAcetam  1,500 mg Oral BID   lisinopril  10 mg Oral Daily   pantoprazole  40 mg Oral BID   polyethylene glycol  17 g Oral Daily   senna  1 tablet Oral QHS   Continuous Infusions: Imaging and lab data was personally reviewed No results found.  LOS: 3 days   Author: Debbe Odea  03/10/2022 12:15 PM  To contact [facility]>   Check the care team in [facility] and look for the attending/consulting [place] provider listed  Log into [email or web address] and use [place]'s universal password   Go to> "[facility]"  and find provider  If you still have difficulty reaching the provider, please page the [facility] (Director on Call) for the Hospitalists listed on amion

## 2022-03-10 NOTE — Progress Notes (Signed)
Physical Therapy Treatment Patient Details Name: Kelly [name] MRN: [id number] DOB: November 24, 1965 Today's Date: 03/10/2022   History of Present Illness Patient is a 57 year old female who presented with 4 day history of left lower extremity swelling and weakness. 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 [id number], HTN, obesity, tobacco abuse, hyperlipidemia.    PT Comments    Pt assisted with transfers and wheelchair mobility.  Pt requiring cues for safety and technique.  Family present today including daughter and spouse.  Explained ankle contracture boot including floating heel.  Continue to recommend AIR upon d/c.   Recommendations for follow up therapy are one component of a multi-disciplinary discharge planning process, led by the attending physician.  Recommendations may be updated based on patient status, additional functional criteria and insurance authorization.  Follow Up Recommendations  Acute inpatient rehab (3hours/day)     Assistance Recommended at Discharge Intermittent Supervision/Assistance  Patient can return home with the following A lot of help with walking and/or transfers;A lot of help with bathing/dressing/bathroom;Help with stairs or ramp for entrance;Assistance with cooking/housework;Assist for transportation   Equipment Recommendations  Wheelchair cushion (measurements PT);Wheelchair (measurements PT)    Recommendations for Other Services       Precautions / Restrictions Precautions Precautions: Fall;Back Precaution Comments: L LE hemplegia     Mobility  Bed Mobility Overal bed mobility: Needs Assistance Bed Mobility: Rolling, Sidelying to Sit Rolling: Mod assist Sidelying to sit: Mod assist       General bed mobility comments: verbal and tactile cues for log roll technique, assist for LEs especially Lt LE due to hemiplegia    Transfers Overall transfer  level: Needs assistance   Transfers: Bed to chair/wheelchair/BSC       Squat pivot transfers: Min assist, +2 safety/equipment     General transfer comment: pt with no strength in Lt LE so blocked knee for safety; transfers with chairs 45* angles so pt can use UEs on armrests to assist; also cues for w/c parts and monitoring Lt LE positioning; performed bed to w/c to recliner    Ambulation/Gait                   Theme park manager mobility: Yes Wheelchair propulsion: Both upper extremities Wheelchair parts: Needs assistance Distance: 180 Wheelchair Assistance Details (indicate cue type and reason): cues for brakes and using leg rest for L LE, cues for propelling and turning w/c, assist for w/c with tight spaces (poor recall from previous session)  Modified Rankin (Stroke Patients Only)       Balance                                            Cognition Arousal/Alertness: Awake/alert Behavior During Therapy: WFL for tasks assessed/performed, Impulsive Overall Cognitive Status: Impaired/Different from baseline                                 General Comments: patient is plesant and cooperative. appears to have poor insight to LLE deficits at this time, requires cues for technique and safety as well as waiting for therapist to set up for transfers prior to initiating  Exercises      General Comments        Pertinent Vitals/Pain Pain Assessment Pain Assessment: No/denies pain    Home Living   Living Arrangements: Spouse/significant other Available Help at Discharge: Family;Available 24 hours/day Type of Home: House Home Access: Stairs to enter Entrance Stairs-Rails: Right Entrance Stairs-Number of Steps: 5 brick steps   Home Layout: One level        Prior Function            PT Goals (current goals can now be found in the care plan section) Progress  towards PT goals: Progressing toward goals    Frequency    Min 3X/week      PT Plan Current plan remains appropriate    Co-evaluation              AM-PAC PT "6 Clicks" Mobility   Outcome Measure  Help needed turning from your back to your side while in a flat bed without using bedrails?: A Lot Help needed moving from lying on your back to sitting on the side of a flat bed without using bedrails?: A Lot Help needed moving to and from a bed to a chair (including a wheelchair)?: A Lot Help needed standing up from a chair using your arms (e.g., wheelchair or bedside chair)?: A Lot Help needed to walk in hospital room?: Total Help needed climbing 3-5 steps with a railing? : Total 6 Click Score: 10    End of Session Equipment Utilized During Treatment: Gait belt Activity Tolerance: Patient tolerated treatment well Patient left: in chair;with call bell/phone within reach;with chair alarm set;with family/visitor present Nurse Communication: Mobility status PT Visit Diagnosis: Other abnormalities of gait and mobility (R26.89);Other symptoms and signs involving the nervous system (R29.898);Hemiplegia and hemiparesis Hemiplegia - Right/Left: Left Hemiplegia - caused by:  (mass in spinal cord centered at T6)     Time: 1252-1321 PT Time Calculation (min) (ACUTE ONLY): 29 min  Charges:  $Therapeutic Activity: 8-22 mins $Wheel Chair Management: 8-22 mins                    Arlyce Dice, DPT Physical Therapist Acute Rehabilitation Services Preferred contact method: Secure Chat Weekend Pager Only: [phone] Office: [place] 03/10/2022, 2:14 PM

## 2022-03-11 ENCOUNTER — Other Ambulatory Visit: Payer: Self-pay

## 2022-03-11 ENCOUNTER — Encounter: Payer: Self-pay | Admitting: Internal Medicine

## 2022-03-11 DIAGNOSIS — C7951 Secondary malignant neoplasm of bone: Secondary | ICD-10-CM | POA: Diagnosis not present

## 2022-03-11 MED ORDER — DEXAMETHASONE 4 MG PO TABS
6.0000 mg | ORAL_TABLET | Freq: Four times a day (QID) | ORAL | Status: DC
  Administered 2022-03-11 – 2022-03-13 (×9): 6 mg via ORAL
  Filled 2022-03-11 (×9): qty 2

## 2022-03-11 MED ORDER — BISACODYL 10 MG RE SUPP
10.0000 mg | Freq: Once | RECTAL | Status: AC
  Administered 2022-03-11: 10 mg via RECTAL
  Filled 2022-03-11: qty 1

## 2022-03-11 NOTE — Progress Notes (Signed)
03/11/22 0941  What Happened  Was fall witnessed? Yes  Who witnessed fall? Valera Castle, NT, Anda Kraft, RN, and Butler Denmark, RN  Patients activity before fall bathroom-assisted  Point of contact buttocks  Was patient injured? No  Provider Notification  Provider Name/Title Dr. Wynelle Cleveland, MD  Date Provider Notified 03/11/22  Time Provider Notified [phone]  Method of Notification Page  Notification Reason Fall (Assisted fall)  Provider response No new orders (Dr. Wynelle Cleveland, MD stated to notify her if we notice any swelling in the left leg.)  Date of Provider Response 03/11/22  Time of Provider Response [phone]  Follow Up  Family notified Yes - comment  Time family notified 1100  Additional tests No  Simple treatment Other (comment) (Rest)  Adult Fall Risk Assessment  Risk Factor Category (scoring not indicated) High fall risk per protocol (document High fall risk)  Patient Fall Risk Level High fall risk  Adult Fall Risk Interventions  Required Bundle Interventions *See Row Information* High fall risk - low, moderate, and high requirements implemented  Additional Interventions Use of appropriate toileting equipment (bedpan, BSC, etc.)  Screening for Fall Injury Risk (To be completed on HIGH fall risk patients) - Assessing Need for Floor Mats  Risk For Fall Injury- Criteria for Floor Mats Previous fall this admission (Assisted fall at this time)  Will Implement Floor Mats Yes  Vitals  Temp (!) 97.5 F (36.4 C)  Temp Source Oral  BP 127/85  MAP (mmHg) 99  BP Location Right Arm  BP Method Automatic  Pulse Rate (!) 101  Pulse Rate Source Monitor  Oxygen Therapy  SpO2 93 %  Pain Assessment  Pain Scale 0-10  Pain Score 0  PCA/Epidural/Spinal Assessment  Respiratory Pattern  (See assessment at 9:55 AM)  Neurological  Neuro (WDL)  (See assessment at 9:55 AM)  Level of Consciousness  (See assessment at 9:55 AM)  Orientation Level  (See assessment at 9:55 AM)   Cognition  (See assessment at 9:55 AM)  Speech  (See assessment at 9:55 AM)  Motor Function/Sensation Assessment  (See assessment at 9:55 AM)  R Foot Dorsiflexion  (See assessment at 9:55 AM)  L Foot Dorsiflexion  (See assessment at 9:55 AM)  R Foot Plantar Flexion  (See assessment at 9:55 AM)  L Foot Plantar Flexion  (See assessment at 9:55 AM)  RUE Motor Response  (See assessment at 9:55 AM)  RUE Sensation  (See assessment at 9:55 AM)  RUE Motor Strength  (See assessment at 9:55 AM)  LUE Motor Response  (See assessment at 9:55 AM)  LUE Sensation  (See assessment at 9:55 AM)  LUE Motor Strength  (See assessment at 9:55 AM)  RLE Motor Response  (See assessment at 9:55 AM)  RLE Sensation  (See assessment at 9:55 AM)  RLE Motor Strength  (See assessment at 9:55 AM)  LLE Motor Response  (See assessment at 9:55 AM)  LLE Sensation  (See assessment at 9:55 AM)  LLE Motor Strength  (See assessment at 9:55 AM)  R Sensory Level  (See assessment at 9:55 AM)  L Sensory Level  (See assessment at 9:55 AM)  Neuro Additional Assessments  (See assessment at 9:55 AM)  Musculoskeletal  Musculoskeletal (WDL)  (See assessment at 9:55 AM)  Assistive Device  (See assessment at 9:55 AM)  Generalized Weakness  (See assessment at 9:55 AM)  Weight Bearing Restrictions  (See assessment at 9:55 AM)  Musculoskeletal Details  Left Ankle  (See assessment at  9:55 AM)  Left Ankle Ortho/Supportive Device  (See assessment at 9:55 AM)  Integumentary  Integumentary (WDL)  (See assessment at 9:55 AM)  RN Assisting with Skin Assessment on Admission  (See assessment at 9:55 AM)  Skin Color  (See assessment at 9:55 AM)  Skin Condition  (See assessment at 9:55 AM)  Skin Integrity  (See assessment at 9:55 AM)  Abrasion Location  (See assessment at 9:55 AM)  Abrasion Location Orientation  (See assessment at 9:55 AM)  Abrasion Intervention  (See assessment at 9:55 AM)  Ecchymosis  Location  (See assessment at 9:55 AM)  Ecchymosis Location Orientation  (See assessment at 9:55 AM)

## 2022-03-11 NOTE — Progress Notes (Addendum)
[facility] Progress Note  Patient: Kelly [name]     [id number]  DOA: 03/07/2022   PCP: [facility]       Brief hospital course: Joanne Ouk, a 57 year old female with a history of glioblastoma post craniotomy, undergoing chemotherapy and radiation, presents with progressive left lower extremity weakness and swelling, back, neck pain and headache. She experienced unusual sensations in the left lower extremity.MRI brain on 2/9 showed no new masses in the brain.  In ED > Examination revealed left lower extremity immobility and decreased sensation extending to the lower abdomen on the left side. She underwent Doppler ultrasound due to the swelling which was negative.   MRI of the spine > intrathecal, dural based versus intramedullary mass in the spinal cord, centered at T6, which measures 11 x 14 x 21 mm, concerning for metastatic disease from the patient's glioblastoma. A this is associated with a syrinx extending superiorly from the mass to the level of T3-T4 and inferiorly to the level of T8-T9. 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  Started on Decadron and IV narcotics. Discussed in Tumor board. Simulation done 2/19, plan for radiation to start on Wed and will be followed by Chemo. [facility] is evaluating her.   Subjective:  No BM in many days.  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, T6 with loss of sensation and weakness in left leg Back pain - cont Keppra and Vimpat - non surgical per Dr Cyndy Freeze (2/17) - prior to steroids> left leg 0/5 - no sensation in foot and decreased sensation in leg  - cont Decadron which is helping regain a small amount of movement- sensation still unchanged - added Oxy 10 mg 5 x day which is helping a great deal and reducing the need for IV Dilaudid- cont laxatives - patient and husband initially stated they will find the  support for her to be at home rather than going to a SNF but would like her not to be at home until radiation is completed - [facility] is looking into taking her  - DME for home being arranged for later when she returns home- Medicaid will pay for everything except a ramp and she has 5 stairs    Active Problems:  Fall 2/10 am - call by RN for a fall while she was receiving a dulcolax suppository- the patient fell due to weak left leg which then bent backward at the knee- RN will continue to monitor for swelling/ bruising and let me know   Obesity, Class III, BMI 40-49.9 (morbid obesity) ([place])   - Body mass index is 34.32 kg/m.  HTN  - Cont Lisinopril- Cr is 0.88  Macrocytosis w/ o anemia - check B12 and folate levels  Dispo: [facility] evaluating her     Code Status: Full Code Consultants: rad onc, NS Level of Care: Level of care: Med-Surg Total time on patient care: 35 DVT prophylaxis:  enoxaparin (LOVENOX) injection 40 mg Start: 03/08/22 1200 SCDs Start: 03/08/22 0204    Objective:   Vitals:   03/10/22 1345 03/10/22 1952 03/11/22 0629 03/11/22 0941  BP: (!) 130/90 130/81 (!) 141/90 127/85  Pulse: 98 88 81 (!) 101  Resp: 18 16 16   $ Temp: 98.2 F (36.8 C) 98.3 F (36.8 C) 98.2 F (36.8 C) (!) 97.5 F (36.4 C)  TempSrc: Oral Oral Oral Oral  SpO2: 97% 96% 94% 93%  Weight:  Height:       Filed Weights   03/08/22 0100  Weight: 90.7 kg   Exam: General exam: Appears comfortable  HEENT: oral mucosa moist Respiratory system: Clear to auscultation.  Cardiovascular system: S1 & S2 heard  Gastrointestinal system: Abdomen soft, non-tender, nondistended. Normal bowel sounds   Extremities: No cyanosis, clubbing or edema Neuro: left leg- can move toes and move foot right to left (a little) Psychiatry:  Mood & affect appropriate.     CBC: Recent Labs  Lab 03/07/22 1558 03/08/22 0319  WBC 5.8 6.7  NEUTROABS 4.2  --   HGB 13.9 14.1  HCT 41.7 41.2  MCV 105.3* 103.5*  PLT  176 [id number]    Basic Metabolic Panel: Recent Labs  Lab 03/07/22 1558 03/08/22 0319  NA 135 136  K 3.9 3.9  CL 99 101  CO2 27 24  GLUCOSE 99 116*  BUN 13 10  CREATININE 0.79 0.68  CALCIUM 9.2 8.9    GFR: Estimated Creatinine Clearance: 84.6 mL/min (by C-G formula based on SCr of 0.68 mg/dL).  Scheduled Meds:  amitriptyline  100 mg Oral QHS   cyclobenzaprine  5-10 mg Oral QHS   dexamethasone  6 mg Oral Q6H   diclofenac Sodium  2 g Topical QID   enoxaparin (LOVENOX) injection  40 mg Subcutaneous Q24H   escitalopram  10 mg Oral QHS   lacosamide  100 mg Oral BID   levETIRAcetam  1,500 mg Oral BID   lisinopril  10 mg Oral Daily   oxyCODONE  10 mg Oral 5 X Daily   pantoprazole  40 mg Oral BID   polyethylene glycol  17 g Oral Daily   senna  1 tablet Oral QHS   Continuous Infusions: Imaging and lab data was personally reviewed No results found.  LOS: 4 days   Author: Debbe Odea  03/11/2022 10:56 AM  To contact [facility]>   Check the care team in [facility] and look for the attending/consulting [facility] provider listed  Log into [email or web address] and use [place]'s universal password   Go to> "[facility]"  and find provider  If you still have difficulty reaching the provider, please page the [facility] (Director on Call) for the Hospitalists listed on amion

## 2022-03-11 NOTE — Plan of Care (Signed)
  Problem: Education: Goal: Knowledge of General Education information will improve Description Including pain rating scale, medication(s)/side effects and non-pharmacologic comfort measures Outcome: Progressing   

## 2022-03-11 NOTE — Progress Notes (Signed)
Inpatient Rehabilitation Admissions Coordinator   I await insurance determination for a possible [facility] admit. I spoke with patient by phone to update her on the Auth process. I will follow up tomorrow.  Danne Baxter, RN, MSN Rehab Admissions Coordinator [phone] 03/11/2022 4:53 PM

## 2022-03-11 NOTE — Consult Note (Signed)
Consultation Note Date: 03/11/2022   Patient Name: Kelly [name]  DOB: 03/14/1965  MRN: [id number]  Age / Sex: 57 y.o., female  PCP: [facility] Referring Physician: Debbe Odea, MD  Reason for Consultation: Establishing goals of care  HPI/Patient Profile: 57 y.o. 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  She has a past medical history significant for glioblastoma status postcraniotomy, undergoing chemo and radiation.  She presented with progressive left lower extremity weakness and swelling back pain and headache.  MRI of the brain showed no new masses in the brain, MRI of the spine with intrathecal dural based versus intramedullary mass in the spinal cord centered at T6, measuring 11 x 14 x 21 mm concerning for metastatic disease from the patient's glioblastoma.  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  Patient was started on Decadron and IV opioids.  She is being followed by Dr. Mickeal Skinner.  She was discussed in tumor board.  Simulation was done and plans to start radiation on 03-12-2022.  Palliative medicine consulted for additional supportive care goals of care discussions and symptom management. Patient is awake alert resting in bed.  I introduced myself and palliative care as follows: Palliative medicine is specialized medical care for people living with serious illness. It focuses on providing relief from the symptoms and stress of a serious illness. The goal is to improve quality of life for both the patient and the family. Goals of care: Broad aims of medical therapy in relation to the patient's values and preferences. Our aim is to provide medical care aimed at enabling patients to achieve the goals that matter  most to them, given the circumstances of their particular medical situation and their constraints.   Brief life review performed.  Goals wishes and values important to the patient attempted to be explored.  Offered active listening, supportive empathic presence and provide other therapeutic techniques at the time of this initial encounter.  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  She lives at home with her husband.  Her daughter is an Therapist, sports.  Her other son is deceased.  She endorses full code full scope care and wishes to be around for her husband and her daughter for as long as possible.  She states that she prays for more time every day.  Pain and on pain symptom management options discussed.  Medication history noted.  Patient is taking bowel regimen.  She is on senna and MiraLAX and also took a suppository.  She denies fecal or urinary incontinence.  NEXT OF KIN Lives at home with husband in [place], [place].  Daughter who is an Therapist, sports and is also available for caregiving.  SUMMARY OF RECOMMENDATIONS   1.  Full code/full scope 2.  Patient is hoping to go to: Inpatient rehab Recommend outpatient palliative care Continue current pain and  known pain symptom management Thank you for the consult. Code Status/Advance Care Planning: Full code   Symptom Management:     Palliative Prophylaxis:  Bowel Regimen  Additional Recommendations (Limitations, Scope, Preferences): Full Scope Treatment  Psycho-social/Spiritual:  Desire for further Chaplaincy support:yes Additional Recommendations: Caregiving  Support/Resources  Prognosis:  Unable to determine  Discharge Planning: To Be Determined      Primary Diagnoses: Present on Admission:  Hypothyroidism  Obesity, Class III, BMI 40-49.9 (morbid obesity) ([place])  Tobacco abuse  Essential hypertension  HLD (hyperlipidemia)  Glioblastoma, IDH-wildtype (HCC)  Left leg weakness  Metastatic cancer to spine  ([place])   I have reviewed the medical record, interviewed the patient and family, and examined the patient. The following aspects are pertinent.  Past Medical History:  Diagnosis Date   Arthritis    Cancer ([place])    Depression    Dyslipidemia    Essential hypertension 07/31/2015   Hypothyroidism 07/31/2015   doctor took her off medication   Social History   Socioeconomic History   Marital status: Married    Spouse name: Not on file   Number of children: Not on file   Years of education: Not on file   Highest education level: Not on file  Occupational History   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[employer] Use   Smoking status: Every Day    Packs/day: 0.75    Years: 38.00    Total pack years: 28.50    Types: Cigarettes   Smokeless tobacco: Never  Substance and Sexual Activity   Alcohol use: No   Drug use: No   Sexual activity: Yes  Other Topics Concern   Not on file  Social History Narrative   Not on file   Social Determinants of Health   Financial Resource Strain: Low Risk  (08/19/2021)   Overall Financial Resource Strain (CARDIA)    Difficulty of Paying Living Expenses: Not very hard  Food Insecurity: No Food Insecurity (03/08/2022)   Hunger Vital Sign    Worried About Running Out of Food in the Last Year: Never true    Ran Out of Food in the Last Year: Never true  Transportation Needs: No Transportation Needs (03/08/2022)   PRAPARE - Hydrologist (Medical): No    Lack of Transportation (Non-Medical): No  Physical Activity: Not on file  Stress: Not on file  Social Connections: Moderately Isolated (08/19/2021)   Social Connection and Isolation Panel [NHANES]    Frequency of Communication with Friends and Family: More than three times a week    Frequency of Social Gatherings with Friends and Family: More than three times a week    Attends Religious Services: Never    Marine scientist or Organizations: No    Attends Arts development officer: Never    Marital Status: Married   Family History  Problem Relation Age of Onset   Healthy Mother    Aneurysm Father    Hypertension Father    Hyperlipidemia Father    Healthy Sister    Healthy Daughter    Heart disease Son    Scheduled Meds:  amitriptyline  100 mg Oral QHS   cyclobenzaprine  5-10 mg Oral QHS   dexamethasone  6 mg Oral Q6H   diclofenac Sodium  2 g Topical QID   enoxaparin (LOVENOX) injection  40 mg Subcutaneous Q24H   escitalopram  10 mg Oral QHS   lacosamide  100 mg Oral BID  levETIRAcetam  1,500 mg Oral BID   lisinopril  10 mg Oral Daily   oxyCODONE  10 mg Oral 5 X Daily   pantoprazole  40 mg Oral BID   polyethylene glycol  17 g Oral Daily   senna  1 tablet Oral QHS   Continuous Infusions: PRN Meds:.bisacodyl, guaiFENesin-dextromethorphan, HYDROmorphone (DILAUDID) injection, ondansetron **OR** ondansetron (ZOFRAN) IV, mouth rinse Medications Prior to Admission:  Prior to Admission medications   Medication Sig Start Date End Date Taking? Authorizing Provider  amitriptyline (ELAVIL) 100 MG tablet TAKE 1 TABLET BY MOUTH EVERYDAY AT BEDTIME Patient taking differently: TAKE 2 TABLET BY MOUTH EVERYDAY AT BEDTIME 02/17/22  Yes Vaslow, Acey Lav, MD  atorvastatin (LIPITOR) 10 MG tablet Take 10 mg by mouth at bedtime. 10/16/20  Yes [provider]  cyclobenzaprine (FLEXERIL) 10 MG tablet Take 5-10 mg by mouth 3 (three) times daily as needed for muscle spasms. 08/18/21  Yes [provider]  escitalopram (LEXAPRO) 10 MG tablet Take 10 mg by mouth at bedtime. 12/17/20  Yes [provider]  Lacosamide 100 MG TABS Take 1 tablet (100 mg total) by mouth 2 (two) times daily. 02/17/22  Yes Vaslow, Acey Lav, MD  levETIRAcetam (KEPPRA) 750 MG tablet TAKE 2 TABLETS (1,500 MG TOTAL) BY MOUTH 2 (TWO) TIMES DAILY. 02/17/22  Yes Vaslow, Acey Lav, MD  lisinopril (ZESTRIL) 10 MG tablet Take 10 mg by mouth at bedtime.   Yes [provider]  prochlorperazine (COMPAZINE) 10 MG tablet Take 1 tablet (10 mg total) by mouth every 6 (six) hours as needed for nausea or vomiting. 12/09/21  Yes Vaslow, Acey Lav, MD  temozolomide (TEMODAR) 100 MG capsule Take 1,600 mg by mouth as directed. May take on an empty stomach or at bedtime to decrease nausea & vomiting. Take 4 tablets (1600 mg) for 5 Days. Then Hold for 23 Days   Yes [provider]  dexamethasone (DECADRON) 2 MG tablet Take 1 tablet (2 mg total) by mouth daily. Patient not taking: Reported on 03/07/2022 01/07/22   Ventura Sellers, MD  fluconazole (DIFLUCAN) 100 MG tablet Take 1 tablet (100 mg total) by mouth daily. Patient not taking: Reported on 03/07/2022 10/25/21   Ventura Sellers, MD  lisinopril (ZESTRIL) 10 MG tablet Take 1 tablet (10 mg total) by mouth daily. 01/17/21 09/16/21  Oswald Hillock, MD   Allergies  Allergen Reactions   Ranitidine Anaphylaxis   Review of Systems Complains of constipation which she states is not a new problem for her. Physical Exam Awake alert Resting in bed Abdomen not distended Regular work of breathing S1-S2 Has some movement left lower extremity  Vital Signs: BP 139/86 (BP Location: Right Arm)   Pulse 92   Temp 98.5 F (36.9 C) (Oral)   Resp 16   Ht 5' 4"$  (1.626 m)   Wt 90.7 kg   SpO2 96%   BMI 34.32 kg/m  Pain Scale: 0-10 POSS *See Group Information*: 1-Acceptable,Awake and alert Pain Score: 0-No pain   SpO2: SpO2: 96 % O2 Device:SpO2: 96 % O2 Flow Rate: .   IO: Intake/output summary:  Intake/Output Summary (Last 24 hours) at 03/11/2022 1405 Last data filed at 03/11/2022 1100 Gross per 24 hour  Intake --  Output 700 ml  Net -700 ml    LBM: Last BM Date : 03/06/22 Baseline Weight: Weight: 90.7 kg Most recent weight: Weight: 90.7 kg     Palliative Assessment/Data:   PPS 60%  Time In:  1300  Time Out:  1400 Time Total:  60  Greater than 50%  of this time was spent counseling and  coordinating care related to the above assessment and plan.  Signed by: Loistine Chance, MD   Please contact Palliative Medicine Team phone at [phone] for questions and concerns.  For individual provider: See Shea Evans

## 2022-03-11 NOTE — H&P (Incomplete)
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 Mickeal Skinner. On office visit 2/12, she reported weakness and decreased motor function of LLE. MRI total spine LMD screening arranged. Her symptoms worsenined prior to study and she presented to [facility] ED for evaluation on 03/07/2022. 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  Dr. Mickeal Skinner contacted and neurosurgery consulted. Decadron started. Dr. 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 Not resectable with any expectation to maintain lower extremity function. Radiation oncology consult obtained by Dr. Lisbeth Renshaw. Discussed potential palliative radiation treatment to the T6 level in 10 fractions. Noticed RLE weakness on 2/19. Plan to start radiation therapy on Wednesday, 2/21, reduce decadron to 4 mg daily at discharge and resume Temodar. She agreed to palliative care consultation. 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   PMH: Underwent craniotomy in February of 2023 by Dr. Arnoldo Morale. Treated with both radiation therapy and temozolomide. Maintained on Keppra and Vimpat, Elavil. Takes lisinopril for essential hypertension. ROS Past Medical History:  Diagnosis Date   Arthritis    Cancer ([place])    Depression    Dyslipidemia    Essential hypertension 07/31/2015   Hypothyroidism 07/31/2015   doctor took her off medication   Past Surgical History:  Procedure Laterality Date   BLADDER SUSPENSION     CRANIOTOMY Right 03/04/2021   Procedure: CRANIOTOMY TUMOR RESECTION W/ BRAIN LAB;  Surgeon: Newman Pies, MD;   Location: [facility];  Service: Neurosurgery;  Laterality: Right;   TONSILLECTOMY     TUBAL LIGATION     Family History  Problem Relation Age of Onset   Healthy Mother    Aneurysm Father    Hypertension Father    Hyperlipidemia Father    Healthy Sister    Healthy Daughter    Heart disease Son    Social History:  reports that she has been smoking cigarettes. She has a 28.50 pack-year smoking history. She has never used smokeless tobacco. She reports that she does not drink alcohol and does not use drugs. Allergies:  Allergies  Allergen Reactions   Ranitidine Anaphylaxis   Facility-Administered Medications Prior to Admission  Medication Dose Route Frequency Provider Last Rate Last Admin   ferrous sulfate tablet 325 mg  325 mg Oral BID WC Opalski, Deborah, DO       Medications Prior to Admission  Medication Sig Dispense Refill   amitriptyline (ELAVIL) 100 MG tablet TAKE 1 TABLET BY MOUTH EVERYDAY AT BEDTIME (Patient taking differently: TAKE 2 TABLET BY MOUTH EVERYDAY AT BEDTIME) 60 tablet 2   atorvastatin (LIPITOR) 10 MG tablet Take 10 mg by mouth at bedtime.     cyclobenzaprine (FLEXERIL) 10 MG tablet Take 5-10 mg by mouth 3 (three) times daily as needed for muscle spasms.     escitalopram (LEXAPRO) 10 MG tablet Take 10 mg by mouth at bedtime.     Lacosamide 100 MG TABS Take 1 tablet (100 mg total) by mouth 2 (two) times daily. 60 tablet 2   levETIRAcetam (KEPPRA) 750 MG tablet TAKE 2 TABLETS (1,500 MG TOTAL) BY MOUTH 2 (TWO) TIMES DAILY. 360 tablet 1   lisinopril (ZESTRIL) 10 MG tablet  Take 10 mg by mouth at bedtime.     prochlorperazine (COMPAZINE) 10 MG tablet Take 1 tablet (10 mg total) by mouth every 6 (six) hours as needed for nausea or vomiting. 30 tablet 1   temozolomide (TEMODAR) 100 MG capsule Take 1,600 mg by mouth as directed. May take on an empty stomach or at bedtime to decrease nausea & vomiting. Take 4 tablets (1600 mg) for 5 Days. Then Hold for 23 Days     dexamethasone  (DECADRON) 2 MG tablet Take 1 tablet (2 mg total) by mouth daily. (Patient not taking: Reported on 03/07/2022) 7 tablet 0   fluconazole (DIFLUCAN) 100 MG tablet Take 1 tablet (100 mg total) by mouth daily. (Patient not taking: Reported on 03/07/2022) 7 tablet 0   lisinopril (ZESTRIL) 10 MG tablet Take 1 tablet (10 mg total) by mouth daily. 30 tablet 2      Home: Home Living Family/patient expects to be discharged to:: Private residence Living Arrangements: Spouse/significant other Available Help at Discharge: Family, Available 24 hours/day Type of Home: House Home Access: Stairs to enter [employer] of Steps: 5 brick steps Entrance Stairs-Rails: Right Home Layout: One level Bathroom Shower/Tub: Tub/shower unit Home Equipment: Conservation officer, nature (2 wheels) Additional Comments: husband is able to help as needed per pt report  Lives With: Spouse   Functional History: Prior Function Prior Level of Function : Independent/Modified Independent ADLs Comments: works full time as Chiropodist as Product/process development scientist Status:  Mobility: Bed Mobility Overal bed mobility: Needs Assistance Bed Mobility: Rolling, Sidelying to Sit Rolling: Mod assist Sidelying to sit: Mod assist Supine to sit: Mod assist General bed mobility comments: verbal and tactile cues for log roll technique, assist for LEs especially Lt LE due to hemiplegia Transfers Overall transfer level: Needs assistance Equipment used: Rolling walker (2 wheels) Transfers: Bed to chair/wheelchair/BSC Sit to Stand: Min assist, +2 physical assistance, +2 safety/equipment Bed to/from chair/wheelchair/BSC transfer type:: Squat pivot Squat pivot transfers: Min assist, +2 safety/equipment General transfer comment: pt with no strength in Lt LE so blocked knee for safety; transfers with chairs 45* angles so pt can use UEs on armrests to assist; also cues for w/c parts and monitoring Lt LE positioning; performed bed to  w/c to recliner   Wheelchair Mobility Wheelchair mobility: Yes Wheelchair propulsion: Both upper extremities Wheelchair parts: Needs assistance Distance: 180 Wheelchair Assistance Details (indicate cue type and reason): cues for brakes and using leg rest for L LE, cues for propelling and turning w/c, assist for w/c with tight spaces (poor recall from previous session)  ADL: ADL Overall ADL's : Needs assistance/impaired Eating/Feeding: Set up, Sitting Grooming: Applying deodorant, Wash/dry face, Wash/dry hands, Supervision/safety, Bed level Upper Body Bathing: Set up, Bed level Lower Body Bathing: Total assistance, Bed level Upper Body Dressing : Set up, Bed level Lower Body Dressing: Bed level, Total assistance Lower Body Dressing Details (indicate cue type and reason): patient needed mod A for rolling to each side in bed. paient was able to cross RLE in figure four and don sock. Toilet Transfer: Minimal assistance, +2 for physical assistance, +2 for safety/equipment Toilet Transfer Details (indicate cue type and reason): with STEDY. patient unable to advance LLE with poor awareness of location on floor wiht patient attempting to cross feet and step with RLE over LLE with multiple attempts to weight shift and help advance LLE with RW. Toileting- Clothing Manipulation and Hygiene: Total assistance, Bed level  Cognition: Cognition Overall Cognitive Status: Impaired/Different from baseline  Orientation Level: Oriented X4 Cognition Arousal/Alertness: Awake/alert Behavior During Therapy: WFL for tasks assessed/performed, Impulsive Overall Cognitive Status: Impaired/Different from baseline General Comments: patient is plesant and cooperative. appears to have poor insight to LLE deficits at this time, requires cues for technique and safety as well as waiting for therapist to set up for transfers prior to initiating  Physical Exam: Blood pressure 127/85, pulse (!) 101, temperature (!) 97.5  F (36.4 C), temperature source Oral, resp. rate 16, height 5' 4"$  (1.626 m), weight 90.7 kg, SpO2 93 %. Physical Exam  No results found for this or any previous visit (from the past 48 hour(s)). No results found.    Blood pressure 127/85, pulse (!) 101, temperature (!) 97.5 F (36.4 C), temperature source Oral, resp. rate 16, height 5' 4"$  (1.626 m), weight 90.7 kg, SpO2 93 %.  Medical Problem List and Plan: 1. 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 Thoracic Drop Metastasis  -patient may *** shower  -ELOS/Goals: ***  2.  Antithrombotics: -DVT/anticoagulation:  Pharmaceutical: Lovenox  -antiplatelet therapy: none  3. Pain Management:  -continue oxycodone 10 mg 5 x daily -continue Flexeril 5-10 mg q HS -continue Voltaren -Tylenol as needed  4. Mood/Behavior/Sleep: LCSW to evaluate and provide emotional support  -antipsychotic agents: n/a  -continue Lexapro and Elavil  5. Neuropsych/cognition: This patient is capable of making decisions on her own behalf.  6. Skin/Wound Care: Routine skin care checks   7. Fluids/Electrolytes/Nutrition: Routine Is and Os and follow-up chemistries  8: Hypertension: monitor TID and prn  -continue lisinopril   9: Glioblastoma, mets to spine:  -continue Decadron 6 mg q 6 hours -continue Keppra 1500 mg BID and Vimpat 50 mg BID  -continue Elavil 100 mg q HS  10: Obesity, Class III  11: GI prophylaxis: Protonix   ***  Barbie Banner, PA-C 03/11/2022

## 2022-03-12 ENCOUNTER — Ambulatory Visit
Admit: 2022-03-12 | Discharge: 2022-03-12 | Disposition: A | Discharge status: Home / Self Care | Payer: Medicaid Other | Attending: Radiation Oncology | Admitting: Radiation Oncology

## 2022-03-12 ENCOUNTER — Encounter: Payer: Self-pay | Admitting: Internal Medicine

## 2022-03-12 ENCOUNTER — Other Ambulatory Visit: Payer: Self-pay

## 2022-03-12 DIAGNOSIS — R200 Anesthesia of skin: Secondary | ICD-10-CM

## 2022-03-12 DIAGNOSIS — C7951 Secondary malignant neoplasm of bone: Secondary | ICD-10-CM | POA: Diagnosis not present

## 2022-03-12 DIAGNOSIS — D492 Neoplasm of unspecified behavior of bone, soft tissue, and skin: Secondary | ICD-10-CM

## 2022-03-12 DIAGNOSIS — R29898 Other symptoms and signs involving the musculoskeletal system: Secondary | ICD-10-CM | POA: Diagnosis not present

## 2022-03-12 LAB — RAD ONC ARIA SESSION SUMMARY
Course Elapsed Days: 0
Plan Fractions Treated to Date: 1
Plan Prescribed Dose Per Fraction: 3 Gy
Plan Total Fractions Prescribed: 10
Plan Total Prescribed Dose: 30 Gy
Reference Point Dosage Given to Date: 3 Gy
Reference Point Session Dosage Given: 3 Gy
Session Number: 1

## 2022-03-12 MED ORDER — ACETAMINOPHEN 325 MG PO TABS
650.0000 mg | ORAL_TABLET | ORAL | Status: DC | PRN
  Filled 2022-03-12: qty 2

## 2022-03-12 MED ORDER — POLYETHYLENE GLYCOL 3350 17 G PO PACK
17.0000 g | PACK | Freq: Two times a day (BID) | ORAL | Status: DC
  Administered 2022-03-12 – 2022-03-13 (×2): 17 g via ORAL
  Filled 2022-03-12 (×2): qty 1

## 2022-03-12 MED ORDER — IBUPROFEN 400 MG PO TABS
400.0000 mg | ORAL_TABLET | Freq: Four times a day (QID) | ORAL | Status: DC | PRN
  Administered 2022-03-12 – 2022-03-13 (×2): 400 mg via ORAL
  Filled 2022-03-12 (×2): qty 1

## 2022-03-12 MED ORDER — SORBITOL 70 % SOLN
960.0000 mL | TOPICAL_OIL | Freq: Once | ORAL | Status: DC
  Filled 2022-03-12: qty 240

## 2022-03-12 NOTE — Progress Notes (Signed)
[facility]                                                                              Kelly [name], is a 57 y.o. female, DOB - 1965/12/26, [id number] Admit date - 03/07/2022    Outpatient Primary MD for the patient is [facility] [place]  LOS - 5  days  Chief Complaint  Patient presents with   Foot Swelling       Brief summary   Patient is a 57 year old female with a history of glioblastoma post craniotomy, undergoing chemotherapy and radiation, presents with progressive left lower extremity weakness and swelling, back, neck pain and headache. She experienced unusual sensations in the left lower extremity.MRI brain on 2/9 showed no new masses in the brain.   In ED > Examination revealed left lower extremity immobility and decreased sensation extending to the lower abdomen on the left side. She underwent Doppler ultrasound due to the swelling which was negative.    MRI of the spine > intrathecal, dural based versus intramedullary mass in the spinal cord, centered at T6, which measures 11 x 14 x 21 mm,concerning for metastatic disease from the patient's glioblastoma. This is associated with a syrinx extending superiorly from the mass to the level of T3-T4 and inferiorly to the level of T8-T9. 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   Started on Decadron and IV narcotics. Discussed in Tumor board. Simulation done 2/19, plan for radiation to start on Wed and will be followed by Chemo. 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, T6 Left leg weakness - prior to steroids,  left leg 0/5 - no sensation in foot and decreased sensation in leg  - cont Decadron which is helping regain a small amount of movement - cont Keppra and Vimpat - Had simulation completed on 2/20, followed by 10 radiation treatments and chemo  -Awaiting  [facility] -Continue pain control, bowel regimen  Active Problems: Constipation -Per patient, no BM in a week, continue Dulcolax, increase MiraLAX to twice daily, senna -Smog enema x 1 today    HTN  -BP stable, continue lisinopril  Obesity, Estimated body mass index is 34.32 kg/m as calculated from the following:   Height as of this encounter: 5' 4"$  (1.626 m).   Weight as of this encounter: 90.7 kg.  Code Status: Full code DVT Prophylaxis:  enoxaparin (LOVENOX) injection 40 mg Start: 03/08/22 1200 SCDs Start: 03/08/22 0204   Level of Care: Level of care: Med-Surg Family Communication: Disposition Plan:      Remains inpatient appropriate: Awaiting [facility]   Procedures:    Consultants:   Palliative medicine [facility]  Antimicrobials: None    Medications  amitriptyline  100 mg Oral QHS   cyclobenzaprine  5-10 mg Oral QHS   dexamethasone  6 mg Oral Q6H   diclofenac Sodium  2 g Topical QID   enoxaparin (LOVENOX) injection  40 mg Subcutaneous Q24H   escitalopram  10 mg Oral QHS   lacosamide  100 mg Oral BID   levETIRAcetam  1,500 mg Oral BID   lisinopril  10 mg Oral Daily   oxyCODONE  10 mg Oral 5 X Daily   pantoprazole  40 mg Oral BID   polyethylene glycol  17 g Oral Daily   senna  1 tablet Oral QHS   sorbitol, milk of mag, mineral oil, glycerin (SMOG) enema  960 mL Rectal Once      Subjective:   [name] was seen and examined today.  Constipated, no acute nausea vomiting or abdominal pain.  Looking forward to starting rehab at [facility].  Simulation completed yesterday.  Objective:   Vitals:   03/11/22 1136 03/11/22 2200 03/12/22 0529 03/12/22 0731  BP: 139/86 131/78 137/84 (!) 142/80  Pulse: 92 85 85 70  Resp: 16 15 16 14  $ Temp: 98.5 F (36.9 C) 98.3 F (36.8 C) 98 F (36.7 C) 98.4 F (36.9 C)  TempSrc: Oral Oral Oral Oral  SpO2: 96% 92% 97% 99%  Weight:      Height:        Intake/Output Summary (Last 24 hours) at 03/12/2022 1050 Last data filed at  03/12/2022 0831 Gross per 24 hour  Intake 475 ml  Output 2550 ml  Net -2075 ml     Wt Readings from Last 3 Encounters:  03/08/22 90.7 kg  03/03/22 90.2 kg  01/02/22 91.4 kg     Exam General: Alert and oriented x 3, NAD Cardiovascular: S1 S2 auscultated,  RRR Respiratory: Clear to auscultation bilaterally, no wheezing Gastrointestinal: Soft, nontender, nondistended, + bowel sounds Ext: no pedal edema bilaterally Neuro: decreased sensation in the leg and feet Skin: No rashes Psych: Normal affect and demeanor, alert and oriented x3     Data Reviewed:  I have personally reviewed following labs    CBC Lab Results  Component Value Date   WBC 6.7 03/08/2022   RBC 3.98 03/08/2022   HGB 14.1 03/08/2022   HCT 41.2 03/08/2022   MCV 103.5 (H) 03/08/2022   MCH 35.4 (H) 03/08/2022   PLT 172 03/08/2022   MCHC 34.2 03/08/2022   RDW 11.9 03/08/2022   LYMPHSABS 1.1 03/07/2022   MONOABS 0.3 03/07/2022   EOSABS 0.2 03/07/2022   BASOSABS 0.0 [id number]     Last metabolic panel Lab Results  Component Value Date   NA 136 03/08/2022   K 3.9 03/08/2022   CL 101 03/08/2022   CO2 24 03/08/2022   BUN 10 03/08/2022   CREATININE 0.68 03/08/2022   GLUCOSE 116 (H) 03/08/2022   GFRNONAA >60 03/08/2022   GFRAA >89 07/31/2015   CALCIUM 8.9 03/08/2022   PROT 6.7 03/03/2022   ALBUMIN 4.0 03/03/2022   BILITOT 0.5 03/03/2022   ALKPHOS 110 03/03/2022   AST 13 (L) 03/03/2022   ALT 13 03/03/2022   ANIONGAP 11 03/08/2022    CBG (last 3)  No results for input(s): "GLUCAP" in the last 72 hours.    Coagulation Profile: No results for input(s): "INR", "PROTIME" in the last 168 hours.   Radiology Studies: I have personally reviewed the imaging studies  No results found.     Estill Cotta M.D. [facility] 03/12/2022, 10:50 AM  Available via Epic secure chat 7am-7pm After 7 pm, please refer to night coverage provider listed on amion.

## 2022-03-12 NOTE — Progress Notes (Signed)
PT Cancellation Note  Patient Details Name: Kelly [name] MRN: [id number] DOB: 1965-06-04   Cancelled Treatment:    Reason Eval/Treat Not Completed: Other (comment);  attempted x2 to see pt, she had just gotten back from XRT and was having lunch on first attempt, second attempt pt reports fatigue and very politely declines.   Baxter Flattery, PT  Acute Rehab Dept [facility]) [phone]  [facility] Weekend Pager [facility] only)  [phone]  03/12/2022     [facility] 03/12/2022, 5:07 PM

## 2022-03-12 NOTE — Progress Notes (Signed)
Occupational Therapy Treatment Patient Details Name: Kelly [name] MRN: [id number] DOB: 16-May-1965 Today's Date: 03/12/2022   History of present illness Patient is a 57 year old female who presented with 4 day history of left lower extremity swelling and weakness. 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 [id number], HTN, obesity, tobacco abuse, hyperlipidemia.   OT comments  Patient wanted to be able to sit on [facility] on this date. Patient was able to engage in sit to stand with STEDY with min guard with nurse present in room as well for safety. Patient needed consistent cues for safety and for education on awareness of deficits. Patient continues to have poor insight to deficits of LLE at this time. Patient's discharge plan remains appropriate at this time. OT will continue to follow acutely.     Recommendations for follow up therapy are one component of a multi-disciplinary discharge planning process, led by the attending physician.  Recommendations may be updated based on patient status, additional functional criteria and insurance authorization.    Follow Up Recommendations  Acute inpatient rehab (3hours/day)     Assistance Recommended at Discharge Frequent or constant Supervision/Assistance  Patient can return home with the following  Two people to help with walking and/or transfers;A lot of help with bathing/dressing/bathroom;Assistance with cooking/housework;Direct supervision/assist for medications management;Assist for transportation;Help with stairs or ramp for entrance;Direct supervision/assist for financial management   Equipment Recommendations  Wheelchair cushion (measurements OT);Wheelchair (measurements OT)    Recommendations for Other Services      Precautions / Restrictions Precautions Precautions: Fall;Back Precaution Booklet Issued: No Precaution Comments: L LE  hemplegia Restrictions Weight Bearing Restrictions: No       Mobility Bed Mobility Overal bed mobility: Needs Assistance Bed Mobility: Rolling, Sidelying to Sit Rolling: Mod assist Sidelying to sit: Mod assist       General bed mobility comments: verbal and tactile cues for log roll technique, assist for LEs especially Lt LE due to hemiplegia               ADL either performed or assessed with clinical judgement   ADL Overall ADL's : Needs assistance/impaired       Toilet Transfer: Minimal assistance;+2 for physical assistance;+2 for safety/equipment;BSC/3in1 Toilet Transfer Details (indicate cue type and reason): with STEDY. patient unable to advance LLE with poor awareness of location on floor wiht patient attempting to cross feet and step with RLE over LLE with multiple attempts to weight shift and help advance LLE with RW. Toileting- Clothing Manipulation and Hygiene: Total assistance;Sit to/from stand Toileting - Clothing Manipulation Details (indicate cue type and reason): on STEDY with continued cues.              Cognition Arousal/Alertness: Awake/alert Behavior During Therapy: WFL for tasks assessed/performed, Impulsive Overall Cognitive Status: Impaired/Different from baseline         General Comments: patient is plesant and very impulsive with pooor insight to deficits with LLE. patient needs extensive cues to slow down. husband present during session needing continued education as well at this time.                   Pertinent Vitals/ Pain       Pain Assessment Pain Assessment: No/denies pain         Frequency  Min 2X/week        Progress Toward Goals  OT Goals(current goals can now be found in the  care plan section)  Progress towards OT goals: Progressing toward goals     Plan Discharge plan remains appropriate       AM-PAC OT "6 Clicks" Daily Activity     Outcome Measure   Help from another person eating meals?: A  Little Help from another person taking care of personal grooming?: A Little Help from another person toileting, which includes using toliet, bedpan, or urinal?: Total Help from another person bathing (including washing, rinsing, drying)?: Total Help from another person to put on and taking off regular upper body clothing?: A Little Help from another person to put on and taking off regular lower body clothing?: A Lot 6 Click Score: 13    End of Session Equipment Utilized During Treatment: Gait belt;Other (comment) (STEDY)  OT Visit Diagnosis: Unsteadiness on feet (R26.81);Other abnormalities of gait and mobility (R26.89);Muscle weakness (generalized) (M62.81);Other symptoms and signs involving the nervous system (R29.898)   Activity Tolerance Patient tolerated treatment well   Patient Left in bed;with call bell/phone within reach;with bed alarm set;with family/visitor present   Nurse Communication Other (comment) (in room during session)        Time: [id number] OT Time Calculation (min): 33 min  Charges: OT General Charges $OT Visit: 1 Visit OT Treatments $Self Care/Home Management : 23-37 mins  Kelly Plowman, MS Acute Rehabilitation Department Office# [phone]   Kelly [name] 03/12/2022, 5:01 PM
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
## 2022-03-12 NOTE — Progress Notes (Signed)
Inpatient Rehabilitation Admissions Coordinator   I await insurance Auth for possible Cir admit.  Danne Baxter, RN, MSN Rehab Admissions Coordinator [phone] 03/12/2022 11:11 AM

## 2022-03-13 ENCOUNTER — Other Ambulatory Visit: Payer: Self-pay

## 2022-03-13 ENCOUNTER — Inpatient Hospital Stay: Payer: Medicaid Other | Admitting: Internal Medicine

## 2022-03-13 ENCOUNTER — Ambulatory Visit
Admit: 2022-03-13 | Discharge: 2022-03-13 | Disposition: A | Discharge status: Home / Self Care | Payer: Medicaid Other | Attending: Radiation Oncology | Admitting: Radiation Oncology

## 2022-03-13 ENCOUNTER — Inpatient Hospital Stay (HOSPITAL_COMMUNITY)
Admission: RE | Admit: 2022-03-13 | Discharge: 2022-03-28 | DRG: 945 | Disposition: A | Discharge status: Home / Self Care | Payer: Medicaid Other | Source: Other Acute Inpatient Hospital | Attending: Physical Medicine and Rehabilitation | Admitting: Physical Medicine and Rehabilitation

## 2022-03-13 ENCOUNTER — Encounter (HOSPITAL_COMMUNITY): Payer: Self-pay | Admitting: Physical Medicine and Rehabilitation

## 2022-03-13 DIAGNOSIS — S24101A Unspecified injury at T1 level of thoracic spinal cord, initial encounter: Secondary | ICD-10-CM | POA: Diagnosis not present

## 2022-03-13 DIAGNOSIS — R4189 Other symptoms and signs involving cognitive functions and awareness: Secondary | ICD-10-CM | POA: Diagnosis not present

## 2022-03-13 DIAGNOSIS — Z510 Encounter for antineoplastic radiation therapy: Secondary | ICD-10-CM | POA: Diagnosis present

## 2022-03-13 DIAGNOSIS — E785 Hyperlipidemia, unspecified: Secondary | ICD-10-CM | POA: Diagnosis present

## 2022-03-13 DIAGNOSIS — Z888 Allergy status to other drugs, medicaments and biological substances status: Secondary | ICD-10-CM

## 2022-03-13 DIAGNOSIS — N319 Neuromuscular dysfunction of bladder, unspecified: Secondary | ICD-10-CM | POA: Diagnosis present

## 2022-03-13 DIAGNOSIS — K5900 Constipation, unspecified: Secondary | ICD-10-CM | POA: Diagnosis not present

## 2022-03-13 DIAGNOSIS — M19012 Primary osteoarthritis, left shoulder: Secondary | ICD-10-CM | POA: Diagnosis present

## 2022-03-13 DIAGNOSIS — G4700 Insomnia, unspecified: Secondary | ICD-10-CM | POA: Diagnosis not present

## 2022-03-13 DIAGNOSIS — G96198 Other disorders of meninges, not elsewhere classified: Secondary | ICD-10-CM

## 2022-03-13 DIAGNOSIS — R531 Weakness: Secondary | ICD-10-CM | POA: Diagnosis present

## 2022-03-13 DIAGNOSIS — R200 Anesthesia of skin: Secondary | ICD-10-CM | POA: Diagnosis not present

## 2022-03-13 DIAGNOSIS — K592 Neurogenic bowel, not elsewhere classified: Secondary | ICD-10-CM | POA: Diagnosis present

## 2022-03-13 DIAGNOSIS — E871 Hypo-osmolality and hyponatremia: Secondary | ICD-10-CM | POA: Diagnosis not present

## 2022-03-13 DIAGNOSIS — R29898 Other symptoms and signs involving the musculoskeletal system: Secondary | ICD-10-CM | POA: Diagnosis not present

## 2022-03-13 DIAGNOSIS — S24109A Unspecified injury at unspecified level of thoracic spinal cord, initial encounter: Secondary | ICD-10-CM | POA: Diagnosis not present

## 2022-03-13 DIAGNOSIS — D72829 Elevated white blood cell count, unspecified: Secondary | ICD-10-CM | POA: Diagnosis present

## 2022-03-13 DIAGNOSIS — F32A Depression, unspecified: Secondary | ICD-10-CM | POA: Diagnosis present

## 2022-03-13 DIAGNOSIS — M25562 Pain in left knee: Secondary | ICD-10-CM | POA: Diagnosis not present

## 2022-03-13 DIAGNOSIS — T380X5A Adverse effect of glucocorticoids and synthetic analogues, initial encounter: Secondary | ICD-10-CM | POA: Diagnosis present

## 2022-03-13 DIAGNOSIS — K5730 Diverticulosis of large intestine without perforation or abscess without bleeding: Secondary | ICD-10-CM | POA: Diagnosis present

## 2022-03-13 DIAGNOSIS — C719 Malignant neoplasm of brain, unspecified: Secondary | ICD-10-CM | POA: Diagnosis not present

## 2022-03-13 DIAGNOSIS — C7951 Secondary malignant neoplasm of bone: Secondary | ICD-10-CM | POA: Diagnosis present

## 2022-03-13 DIAGNOSIS — I10 Essential (primary) hypertension: Secondary | ICD-10-CM | POA: Diagnosis present

## 2022-03-13 DIAGNOSIS — A419 Sepsis, unspecified organism: Secondary | ICD-10-CM | POA: Diagnosis not present

## 2022-03-13 DIAGNOSIS — D492 Neoplasm of unspecified behavior of bone, soft tissue, and skin: Secondary | ICD-10-CM | POA: Diagnosis not present

## 2022-03-13 DIAGNOSIS — I69854 Hemiplegia and hemiparesis following other cerebrovascular disease affecting left non-dominant side: Secondary | ICD-10-CM | POA: Diagnosis not present

## 2022-03-13 DIAGNOSIS — E039 Hypothyroidism, unspecified: Secondary | ICD-10-CM | POA: Diagnosis present

## 2022-03-13 DIAGNOSIS — Z8249 Family history of ischemic heart disease and other diseases of the circulatory system: Secondary | ICD-10-CM

## 2022-03-13 DIAGNOSIS — R6520 Severe sepsis without septic shock: Secondary | ICD-10-CM | POA: Diagnosis not present

## 2022-03-13 DIAGNOSIS — S24109S Unspecified injury at unspecified level of thoracic spinal cord, sequela: Principal | ICD-10-CM

## 2022-03-13 DIAGNOSIS — J189 Pneumonia, unspecified organism: Secondary | ICD-10-CM | POA: Diagnosis not present

## 2022-03-13 DIAGNOSIS — G9389 Other specified disorders of brain: Secondary | ICD-10-CM | POA: Diagnosis present

## 2022-03-13 DIAGNOSIS — F4321 Adjustment disorder with depressed mood: Secondary | ICD-10-CM | POA: Diagnosis present

## 2022-03-13 DIAGNOSIS — R159 Full incontinence of feces: Secondary | ICD-10-CM | POA: Diagnosis not present

## 2022-03-13 DIAGNOSIS — F17210 Nicotine dependence, cigarettes, uncomplicated: Secondary | ICD-10-CM | POA: Diagnosis present

## 2022-03-13 DIAGNOSIS — I951 Orthostatic hypotension: Secondary | ICD-10-CM | POA: Diagnosis not present

## 2022-03-13 DIAGNOSIS — M25561 Pain in right knee: Secondary | ICD-10-CM | POA: Diagnosis not present

## 2022-03-13 DIAGNOSIS — F4323 Adjustment disorder with mixed anxiety and depressed mood: Secondary | ICD-10-CM | POA: Diagnosis not present

## 2022-03-13 DIAGNOSIS — I878 Other specified disorders of veins: Secondary | ICD-10-CM | POA: Diagnosis present

## 2022-03-13 DIAGNOSIS — Z83438 Family history of other disorder of lipoprotein metabolism and other lipidemia: Secondary | ICD-10-CM | POA: Diagnosis not present

## 2022-03-13 DIAGNOSIS — Z23 Encounter for immunization: Secondary | ICD-10-CM | POA: Diagnosis not present

## 2022-03-13 DIAGNOSIS — Z79899 Other long term (current) drug therapy: Secondary | ICD-10-CM | POA: Diagnosis not present

## 2022-03-13 DIAGNOSIS — S24109D Unspecified injury at unspecified level of thoracic spinal cord, subsequent encounter: Secondary | ICD-10-CM | POA: Diagnosis not present

## 2022-03-13 DIAGNOSIS — K449 Diaphragmatic hernia without obstruction or gangrene: Secondary | ICD-10-CM | POA: Diagnosis present

## 2022-03-13 DIAGNOSIS — Z6834 Body mass index (BMI) 34.0-34.9, adult: Secondary | ICD-10-CM

## 2022-03-13 DIAGNOSIS — C712 Malignant neoplasm of temporal lobe: Secondary | ICD-10-CM | POA: Diagnosis present

## 2022-03-13 DIAGNOSIS — I959 Hypotension, unspecified: Secondary | ICD-10-CM | POA: Diagnosis not present

## 2022-03-13 DIAGNOSIS — R4587 Impulsiveness: Secondary | ICD-10-CM | POA: Diagnosis not present

## 2022-03-13 DIAGNOSIS — M1990 Unspecified osteoarthritis, unspecified site: Secondary | ICD-10-CM | POA: Diagnosis present

## 2022-03-13 DIAGNOSIS — R2689 Other abnormalities of gait and mobility: Secondary | ICD-10-CM | POA: Diagnosis present

## 2022-03-13 DIAGNOSIS — R339 Retention of urine, unspecified: Secondary | ICD-10-CM | POA: Diagnosis present

## 2022-03-13 DIAGNOSIS — E669 Obesity, unspecified: Secondary | ICD-10-CM | POA: Diagnosis present

## 2022-03-13 DIAGNOSIS — M47816 Spondylosis without myelopathy or radiculopathy, lumbar region: Secondary | ICD-10-CM | POA: Diagnosis present

## 2022-03-13 DIAGNOSIS — R3915 Urgency of urination: Secondary | ICD-10-CM | POA: Diagnosis present

## 2022-03-13 DIAGNOSIS — G893 Neoplasm related pain (acute) (chronic): Secondary | ICD-10-CM | POA: Diagnosis present

## 2022-03-13 DIAGNOSIS — Z9221 Personal history of antineoplastic chemotherapy: Secondary | ICD-10-CM

## 2022-03-13 DIAGNOSIS — R203 Hyperesthesia: Secondary | ICD-10-CM | POA: Diagnosis not present

## 2022-03-13 DIAGNOSIS — R5381 Other malaise: Principal | ICD-10-CM | POA: Diagnosis present

## 2022-03-13 DIAGNOSIS — Z7951 Long term (current) use of inhaled steroids: Secondary | ICD-10-CM

## 2022-03-13 DIAGNOSIS — K219 Gastro-esophageal reflux disease without esophagitis: Secondary | ICD-10-CM | POA: Diagnosis present

## 2022-03-13 LAB — URINALYSIS, W/ REFLEX TO CULTURE (INFECTION SUSPECTED)
Bacteria, UA: NONE SEEN
Bilirubin Urine: NEGATIVE
Glucose, UA: NEGATIVE mg/dL
Ketones, ur: NEGATIVE mg/dL
Leukocytes,Ua: NEGATIVE
Nitrite: NEGATIVE
Protein, ur: NEGATIVE mg/dL
Specific Gravity, Urine: 1.009 (ref 1.005–1.030)
pH: 6 (ref 5.0–8.0)

## 2022-03-13 LAB — RAD ONC ARIA SESSION SUMMARY
Course Elapsed Days: 1
Plan Fractions Treated to Date: 2
Plan Prescribed Dose Per Fraction: 3 Gy
Plan Total Fractions Prescribed: 10
Plan Total Prescribed Dose: 30 Gy
Reference Point Dosage Given to Date: 6 Gy
Reference Point Session Dosage Given: 3 Gy
Session Number: 2

## 2022-03-13 MED ORDER — ENOXAPARIN SODIUM 40 MG/0.4ML IJ SOSY
40.0000 mg | PREFILLED_SYRINGE | INTRAMUSCULAR | Status: DC
  Administered 2022-03-14 – 2022-03-28 (×13): 40 mg via SUBCUTANEOUS
  Filled 2022-03-13 (×14): qty 0.4

## 2022-03-13 MED ORDER — MAGNESIUM HYDROXIDE 400 MG/5ML PO SUSP: 30.0000 mL | Freq: Every day | ORAL | Status: DC | PRN

## 2022-03-13 MED ORDER — SENNOSIDES-DOCUSATE SODIUM 8.6-50 MG PO TABS
2.0000 | ORAL_TABLET | Freq: Every day | ORAL | Status: DC
  Administered 2022-03-13 – 2022-03-27 (×12): 2 via ORAL
  Filled 2022-03-13 (×15): qty 2

## 2022-03-13 MED ORDER — GUAIFENESIN-DM 100-10 MG/5ML PO SYRP: 5.0000 mL | ORAL_SOLUTION | Freq: Four times a day (QID) | ORAL | Status: DC | PRN

## 2022-03-13 MED ORDER — ACETAMINOPHEN 325 MG PO TABS
325.0000 mg | ORAL_TABLET | ORAL | Status: DC | PRN
  Administered 2022-03-17 – 2022-03-27 (×3): 650 mg via ORAL
  Filled 2022-03-13 (×5): qty 2

## 2022-03-13 MED ORDER — SORBITOL 70 % SOLN: 30.0000 mL | Freq: Every day | Status: DC | PRN

## 2022-03-13 MED ORDER — FLEET ENEMA 7-19 GM/118ML RE ENEM: 1.0000 | ENEMA | Freq: Once | RECTAL | Status: DC | PRN

## 2022-03-13 MED ORDER — DEXAMETHASONE 4 MG PO TABS
6.0000 mg | ORAL_TABLET | Freq: Four times a day (QID) | ORAL | Status: DC
  Administered 2022-03-13 – 2022-03-25 (×48): 6 mg via ORAL
  Filled 2022-03-13 (×49): qty 2

## 2022-03-13 MED ORDER — CYCLOBENZAPRINE HCL 5 MG PO TABS
5.0000 mg | ORAL_TABLET | Freq: Every day | ORAL | Status: DC
  Administered 2022-03-13 – 2022-03-27 (×15): 10 mg via ORAL
  Filled 2022-03-13 (×15): qty 2

## 2022-03-13 MED ORDER — SENNOSIDES-DOCUSATE SODIUM 8.6-50 MG PO TABS: 2.0000 | ORAL_TABLET | Freq: Every day | ORAL | Status: DC

## 2022-03-13 MED ORDER — ALUM & MAG HYDROXIDE-SIMETH 200-200-20 MG/5ML PO SUSP
30.0000 mL | ORAL | Status: DC | PRN
  Administered 2022-03-24: 30 mL via ORAL
  Filled 2022-03-13: qty 30

## 2022-03-13 MED ORDER — IBUPROFEN 400 MG PO TABS: 400.0000 mg | ORAL_TABLET | Freq: Four times a day (QID) | ORAL | 0 refills | Status: DC | PRN

## 2022-03-13 MED ORDER — LEVETIRACETAM 750 MG PO TABS
1500.0000 mg | ORAL_TABLET | Freq: Two times a day (BID) | ORAL | Status: DC
  Administered 2022-03-13 – 2022-03-28 (×30): 1500 mg via ORAL
  Filled 2022-03-13 (×30): qty 2

## 2022-03-13 MED ORDER — AMITRIPTYLINE HCL 25 MG PO TABS
100.0000 mg | ORAL_TABLET | Freq: Every day | ORAL | Status: DC
  Administered 2022-03-13 – 2022-03-27 (×15): 100 mg via ORAL
  Filled 2022-03-13 (×16): qty 4

## 2022-03-13 MED ORDER — PANTOPRAZOLE SODIUM 40 MG PO TBEC
40.0000 mg | DELAYED_RELEASE_TABLET | Freq: Two times a day (BID) | ORAL | Status: DC
  Administered 2022-03-13 – 2022-03-28 (×30): 40 mg via ORAL
  Filled 2022-03-13 (×30): qty 1

## 2022-03-13 MED ORDER — DEXAMETHASONE 6 MG PO TABS: 6.0000 mg | ORAL_TABLET | Freq: Four times a day (QID) | ORAL | 1 refills | Status: DC

## 2022-03-13 MED ORDER — OXYCODONE HCL 10 MG PO TABS: 10.0000 mg | ORAL_TABLET | ORAL | 0 refills | Status: DC | PRN

## 2022-03-13 MED ORDER — POLYETHYLENE GLYCOL 3350 17 G PO PACK: 17.0000 g | PACK | Freq: Two times a day (BID) | ORAL | 3 refills | Status: DC

## 2022-03-13 MED ORDER — DICLOFENAC SODIUM 1 % EX GEL: 2.0000 g | Freq: Four times a day (QID) | CUTANEOUS | 3 refills | Status: DC

## 2022-03-13 MED ORDER — ENOXAPARIN SODIUM 40 MG/0.4ML IJ SOSY: 40.0000 mg | PREFILLED_SYRINGE | INTRAMUSCULAR | Status: DC

## 2022-03-13 MED ORDER — POLYETHYLENE GLYCOL 3350 17 G PO PACK
17.0000 g | PACK | Freq: Two times a day (BID) | ORAL | Status: DC
  Administered 2022-03-14 – 2022-03-20 (×11): 17 g via ORAL
  Filled 2022-03-13 (×16): qty 1

## 2022-03-13 MED ORDER — ACETAMINOPHEN 325 MG PO TABS: 650.0000 mg | ORAL_TABLET | ORAL | Status: DC | PRN

## 2022-03-13 MED ORDER — TRAZODONE HCL 50 MG PO TABS
25.0000 mg | ORAL_TABLET | Freq: Every evening | ORAL | Status: DC | PRN
  Administered 2022-03-23: 50 mg via ORAL
  Filled 2022-03-13: qty 1

## 2022-03-13 MED ORDER — METHOCARBAMOL 500 MG PO TABS
500.0000 mg | ORAL_TABLET | Freq: Four times a day (QID) | ORAL | Status: DC | PRN
  Administered 2022-03-17 – 2022-03-23 (×2): 500 mg via ORAL
  Filled 2022-03-13 (×2): qty 1

## 2022-03-13 MED ORDER — PROCHLORPERAZINE MALEATE 5 MG PO TABS: 5.0000 mg | ORAL_TABLET | Freq: Four times a day (QID) | ORAL | Status: DC | PRN

## 2022-03-13 MED ORDER — OXYCODONE HCL 5 MG PO TABS
10.0000 mg | ORAL_TABLET | Freq: Every day | ORAL | Status: DC
  Administered 2022-03-13 – 2022-03-24 (×50): 10 mg via ORAL
  Filled 2022-03-13 (×52): qty 2

## 2022-03-13 MED ORDER — PNEUMOCOCCAL 20-VAL CONJ VACC 0.5 ML IM SUSY
0.5000 mL | PREFILLED_SYRINGE | INTRAMUSCULAR | Status: AC
  Administered 2022-03-14: 0.5 mL via INTRAMUSCULAR
  Filled 2022-03-13: qty 0.5

## 2022-03-13 MED ORDER — SORBITOL 70 % SOLN
960.0000 mL | TOPICAL_OIL | Freq: Once | ORAL | Status: DC
  Filled 2022-03-13 (×2): qty 240

## 2022-03-13 MED ORDER — IBUPROFEN 400 MG PO TABS
400.0000 mg | ORAL_TABLET | Freq: Four times a day (QID) | ORAL | Status: DC | PRN
  Administered 2022-03-22 – 2022-03-24 (×4): 400 mg via ORAL
  Filled 2022-03-13 (×5): qty 1

## 2022-03-13 MED ORDER — DICLOFENAC SODIUM 1 % EX GEL
2.0000 g | Freq: Four times a day (QID) | CUTANEOUS | Status: DC
  Administered 2022-03-13 – 2022-03-28 (×44): 2 g via TOPICAL
  Filled 2022-03-13 (×2): qty 100

## 2022-03-13 MED ORDER — SENNOSIDES-DOCUSATE SODIUM 8.6-50 MG PO TABS: 2.0000 | ORAL_TABLET | Freq: Every day | ORAL | 3 refills | Status: DC

## 2022-03-13 MED ORDER — INFLUENZA VAC SPLIT QUAD 0.5 ML IM SUSY
0.5000 mL | PREFILLED_SYRINGE | INTRAMUSCULAR | Status: AC
  Administered 2022-03-14: 0.5 mL via INTRAMUSCULAR
  Filled 2022-03-13: qty 0.5

## 2022-03-13 MED ORDER — PROCHLORPERAZINE EDISYLATE 10 MG/2ML IJ SOLN: 5.0000 mg | Freq: Four times a day (QID) | INTRAMUSCULAR | Status: DC | PRN

## 2022-03-13 MED ORDER — LISINOPRIL 10 MG PO TABS
10.0000 mg | ORAL_TABLET | Freq: Every day | ORAL | Status: DC
  Administered 2022-03-13 – 2022-03-23 (×11): 10 mg via ORAL
  Filled 2022-03-13 (×11): qty 1

## 2022-03-13 MED ORDER — PROCHLORPERAZINE 25 MG RE SUPP: 12.5000 mg | Freq: Four times a day (QID) | RECTAL | Status: DC | PRN

## 2022-03-13 MED ORDER — LACOSAMIDE 50 MG PO TABS
100.0000 mg | ORAL_TABLET | Freq: Two times a day (BID) | ORAL | Status: DC
  Administered 2022-03-13 – 2022-03-28 (×30): 100 mg via ORAL
  Filled 2022-03-13 (×30): qty 2

## 2022-03-13 MED ORDER — PANTOPRAZOLE SODIUM 40 MG PO TBEC: 40.0000 mg | DELAYED_RELEASE_TABLET | Freq: Two times a day (BID) | ORAL | 3 refills | Status: DC

## 2022-03-13 MED ORDER — DIPHENHYDRAMINE HCL 12.5 MG/5ML PO ELIX: 12.5000 mg | ORAL_SOLUTION | Freq: Four times a day (QID) | ORAL | Status: DC | PRN

## 2022-03-13 MED ORDER — ESCITALOPRAM OXALATE 10 MG PO TABS
10.0000 mg | ORAL_TABLET | Freq: Every day | ORAL | Status: DC
  Administered 2022-03-13 – 2022-03-27 (×15): 10 mg via ORAL
  Filled 2022-03-13 (×15): qty 1

## 2022-03-13 NOTE — Progress Notes (Signed)
Physical Therapy Treatment Patient Details Name: Kelly [name] MRN: [id number] DOB: December 03, 1965 Today's Date: 03/13/2022   History of Present Illness Patient is a 57 year old female who presented with 4 day history of left lower extremity swelling and weakness. 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 [id number], HTN, obesity, tobacco abuse, hyperlipidemia.    PT Comments    Today's PT session focused on performance of multiple transfer methods and w/c mobility/management. Pt requires cues for awareness to and management of L LE - still demonstrates decreased insight into deficits during transfers. Pt propelled w/c total of ~2101f forward, backward, around obstacles, and navigating tight spaces and turns.Pt with use of B UEs to propel w/c intermittent use of R LE to assist. Pt will benefit from continued skilled PT to increase their independence and maximize safety with mobility.    Recommendations for follow up therapy are one component of a multi-disciplinary discharge planning process, led by the attending physician.  Recommendations may be updated based on patient status, additional functional criteria and insurance authorization.  Follow Up Recommendations  Acute inpatient rehab (3hours/day)     Assistance Recommended at Discharge Frequent or constant Supervision/Assistance  Patient can return home with the following A lot of help with walking and/or transfers;A lot of help with bathing/dressing/bathroom;Help with stairs or ramp for entrance;Assistance with cooking/housework;Assist for transportation   Equipment Recommendations  Wheelchair cushion (measurements PT);Wheelchair (measurements PT)    Recommendations for Other Services       Precautions / Restrictions Precautions Precautions: Fall;Back Precaution Booklet Issued: No Precaution Comments: L LE hemplegia Restrictions Weight Bearing  Restrictions: No     Mobility  Bed Mobility Overal bed mobility: Needs Assistance Bed Mobility: Supine to Sit, Sit to Supine     Supine to sit: Mod assist, HOB elevated Sit to supine: Min assist, HOB elevated   General bed mobility comments: Pt using R LE to assist with L LE to EOB. Assist for trunk to upright    Transfers Overall transfer level: Needs assistance Equipment used: None, Sliding board Transfers: Sit to/from Stand, Bed to chair/wheelchair/BSC Sit to Stand: Min assist, +2 safety/equipment Stand pivot transfers: Min assist, +2 physical assistance, +2 safety/equipment   Squat pivot transfers: Min assist, +2 safety/equipment    Lateral/Scoot Transfers: Min assist, +2 safety/equipment, +2 physical assistance, With slide board General transfer comment: pt with no strength in Lt LE required facilitation for progression of LE with transfers; transfers with chairs 45* angles so pt can use UEs on armrests to assist;cues  monitoring Lt LE positioning; performed bed to w/c with squat pivot. [facility] with stand pivot, and w/c back to bed with use of slideboard to maximize independence.    Ambulation/Gait                   STheme park managermobility: Yes Wheelchair propulsion: Both upper extremities Wheelchair parts: Needs assistance Distance: 200 Wheelchair Assistance Details (indicate cue type and reason): cues for brakes and using leg rest for L LE, cues for hand  placement for improved efficency with propulsion, increased time for problem solving with turns, tight spaces. Pt propelled backwards, performed tight 180deg turns, and weaving around obstacles.  Modified Rankin (Stroke Patients Only)       Balance Overall balance assessment: Needs assistance Sitting-balance support: Feet  supported Sitting balance-Leahy Scale: Fair                                       Cognition Arousal/Alertness: Awake/alert Behavior During Therapy: WFL for tasks assessed/performed, Impulsive Overall Cognitive Status: Impaired/Different from baseline                                 General Comments: patient is plesant pooor insight to deficits with LLE.        Exercises      General Comments        Pertinent Vitals/Pain Pain Assessment Pain Assessment: No/denies pain    Home Living                          Prior Function            PT Goals (current goals can now be found in the care plan section) Acute Rehab PT Goals Patient Stated Goal: Want to be able to transfer to chair and toilet .. ultimate goals to walk again PT Goal Formulation: With patient Time For Goal Achievement: 03/23/22 Potential to Achieve Goals: Good Progress towards PT goals: Progressing toward goals    Frequency    Min 3X/week      PT Plan Current plan remains appropriate    Co-evaluation              AM-PAC PT "6 Clicks" Mobility   Outcome Measure  Help needed turning from your back to your side while in a flat bed without using bedrails?: A Little Help needed moving from lying on your back to sitting on the side of a flat bed without using bedrails?: A Little Help needed moving to and from a bed to a chair (including a wheelchair)?: A Lot Help needed standing up from a chair using your arms (e.g., wheelchair or bedside chair)?: A Lot Help needed to walk in hospital room?: Total Help needed climbing 3-5 steps with a railing? : Total 6 Click Score: 12    End of Session Equipment Utilized During Treatment: Gait belt Activity Tolerance: Patient tolerated treatment well Patient left: in bed;with call bell/phone within reach;with bed alarm set;with family/visitor present (pt going to radiation shortly so returned to bed at end of session) Nurse Communication: Mobility status PT Visit Diagnosis: Other abnormalities of gait and mobility  (R26.89);Other symptoms and signs involving the nervous system (R29.898);Hemiplegia and hemiparesis Hemiplegia - Right/Left: Left     Time: [id number] PT Time Calculation (min) (ACUTE ONLY): 63 min  Charges:  $Therapeutic Activity: 23-37 mins $Wheel Chair Management: 23-37 mins                     Festus Barren PT, DPT  Acute Rehabilitation Services  Office [phone]  03/13/2022, 2:14 PM
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
## 2022-03-13 NOTE — Progress Notes (Signed)
[facility]                                                                              Kelly [name], is a 57 y.o. female, DOB - 13-Oct-1965, [id number] Admit date - 03/07/2022    Outpatient Primary [facility] for the [facility] is [facility]  LOS - 6  days  Chief Complaint  Patient presents with   Foot Swelling       Brief summary   Patient is a 57 year old female with a history of glioblastoma post craniotomy, undergoing chemotherapy and radiation, presents with progressive left lower extremity weakness and swelling, back, neck pain and headache. She experienced unusual sensations in the left lower extremity.MRI brain on 2/9 showed no new masses in the brain.   In ED > Examination revealed left lower extremity immobility and decreased sensation extending to the lower abdomen on the left side. She underwent Doppler ultrasound due to the swelling which was negative.    MRI of the spine > intrathecal, dural based versus intramedullary mass in the spinal cord, centered at T6, which measures 11 x 14 x 21 mm,concerning for metastatic disease from the patient's glioblastoma. This is associated with a syrinx extending superiorly from the mass to the level of T3-T4 and inferiorly to the level of T8-T9. 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   Started on Decadron and IV narcotics. Discussed in Tumor board. Simulation done 2/19, plan for radiation to start on Wed and will be followed by Chemo. 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, T6 Left leg weakness - left leg 0/5 - no sensation in foot and decreased sensation in leg  - cont Decadron which is helping regain a small amount of movement - cont Keppra and Vimpat - Had simulation completed on 2/20, started XRT, plan for 10 radiation treatments and chemo  -Awaiting CIR -Continue pain  control, bowel regimen  Active Problems: Constipation -Per patient, small BM yesterday - added smog enema x 1, continue MiraLAX, Senokot as, Dulcolax    HTN  -BP stable, continue lisinopril  Obesity, Estimated body mass index is 34.32 kg/m as calculated from the following:   Height as of this encounter: 5' 4"$  (1.626 m).   Weight as of this encounter: 90.7 kg.  Code Status: Full code DVT Prophylaxis:  enoxaparin (LOVENOX) injection 40 mg Start: 03/08/22 1200 SCDs Start: 03/08/22 0204   Level of Care: Level of care: Med-Surg Family Communication: Disposition Plan:      Remains inpatient appropriate: Awaiting CIR, medically stable.   Procedures:    Consultants:   Palliative medicine CIR  Antimicrobials: None    Medications  amitriptyline  100 mg Oral QHS   cyclobenzaprine  5-10 mg Oral QHS   dexamethasone  6 mg Oral Q6H   diclofenac Sodium  2 g Topical QID   enoxaparin (LOVENOX) injection  40 mg Subcutaneous Q24H   escitalopram  10 mg Oral QHS   lacosamide  100 mg Oral BID   levETIRAcetam  1,500 mg Oral BID  lisinopril  10 mg Oral Daily   oxyCODONE  10 mg Oral 5 X Daily   pantoprazole  40 mg Oral BID   polyethylene glycol  17 g Oral BID   senna  1 tablet Oral QHS   sorbitol, milk of mag, mineral oil, glycerin (SMOG) enema  960 mL Rectal Once      Subjective:   Kelly [name] was seen and examined today.  Still feels constipated, had a small BM yesterday.  Hoping for CIR today.  Started XRT.  Objective:   Vitals:   03/12/22 0529 03/12/22 0731 03/12/22 2150 03/13/22 0603  BP: 137/84 (!) 142/80 (!) 129/91 139/89  Pulse: 85 70 90 79  Resp: 16 14 17 17  $ Temp: 98 F (36.7 C) 98.4 F (36.9 C) 98.2 F (36.8 C) 98.4 F (36.9 C)  TempSrc: Oral Oral    SpO2: 97% 99% 93% 92%  Weight:      Height:        Intake/Output Summary (Last 24 hours) at 03/13/2022 1115 Last data filed at 03/13/2022 0500 Gross per 24 hour  Intake --  Output 1950 ml  Net -1950  ml     Wt Readings from Last 3 Encounters:  03/08/22 90.7 kg  03/03/22 90.2 kg  01/02/22 91.4 kg   Physical Exam General: Alert and oriented x 3, NAD Cardiovascular: S1 S2 clear, RRR.  Respiratory: CTAB, no wheezing, rales or rhonchi Gastrointestinal: Soft, nontender, nondistended, NBS Ext: no pedal edema bilaterally Neuro: strength and sensation decreased in the left leg and foot Psych: Normal affect    Data Reviewed:  I have personally reviewed following labs    CBC Lab Results  Component Value Date   WBC 6.7 03/08/2022   RBC 3.98 03/08/2022   HGB 14.1 03/08/2022   HCT 41.2 03/08/2022   MCV 103.5 (H) 03/08/2022   MCH 35.4 (H) 03/08/2022   PLT 172 03/08/2022   MCHC 34.2 03/08/2022   RDW 11.9 03/08/2022   LYMPHSABS 1.1 03/07/2022   MONOABS 0.3 03/07/2022   EOSABS 0.2 03/07/2022   BASOSABS 0.0 [id number]     Last metabolic panel Lab Results  Component Value Date   NA 136 03/08/2022   K 3.9 03/08/2022   CL 101 03/08/2022   CO2 24 03/08/2022   BUN 10 03/08/2022   CREATININE 0.68 03/08/2022   GLUCOSE 116 (H) 03/08/2022   GFRNONAA >60 03/08/2022   GFRAA >89 07/31/2015   CALCIUM 8.9 03/08/2022   PROT 6.7 03/03/2022   ALBUMIN 4.0 03/03/2022   BILITOT 0.5 03/03/2022   ALKPHOS 110 03/03/2022   AST 13 (L) 03/03/2022   ALT 13 03/03/2022   ANIONGAP 11 03/08/2022    CBG (last 3)  No results for input(s): "GLUCAP" in the last 72 hours.    Coagulation Profile: No results for input(s): "INR", "PROTIME" in the last 168 hours.   Radiology Studies: I have personally reviewed the imaging studies  No results found.     Estill Cotta M.D. [facility] 03/13/2022, 11:15 AM  Available via Epic secure chat 7am-7pm After 7 pm, please refer to night coverage provider listed on amion.

## 2022-03-13 NOTE — H&P (Addendum)
Physical Medicine and Rehabilitation Admission H&P     CC: Functional deficits secondary to 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 Mickeal Skinner. On office visit 2/12, she reported weakness and decreased motor function of LLE. MRI total spine LMD screening arranged. Her symptoms worsened prior to study and she presented to [facility] ED for evaluation on 03/07/2022. 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  Dr. Mickeal Skinner contacted and neurosurgery consulted. Decadron started. On Dr. Hewitt Shorts evaluation, he stated "metastatic tumor, most likely glioblastoma multiforme, to the spinal cord at T6. Not resectable with any expectation to maintain lower extremity function". Radiation oncology consult obtained by Dr. Lisbeth Renshaw. Discussed potential palliative radiation treatment to the T6 level in 10 fractions. Noticed RLE weakness on 2/19. Plan to start radiation therapy on Wednesday, 2/21, reduce decadron to 4 mg daily at discharge and resume Temodar. She agreed to palliative care consultation. 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     PMH: Underwent craniotomy in February of 2023 by Dr. Arnoldo Morale. Treated with both radiation therapy and temozolomide. Maintained on Keppra and Vimpat, Elavil. Takes lisinopril for essential hypertension. Review of Systems  Constitutional:  Negative for chills and fever.  HENT:  Negative for hearing loss.   Eyes:  Negative for blurred vision and double vision.  Respiratory:  Negative for cough and shortness of breath.   Cardiovascular:  Negative for chest pain and palpitations.  Gastrointestinal:  Positive for constipation. Negative for diarrhea, nausea and vomiting.  Genitourinary:   Positive for urgency.  Neurological:  Positive for sensory change and focal weakness. Negative for dizziness and headaches.  Psychiatric/Behavioral:  The patient does not have insomnia.         Past Medical History:  Diagnosis Date   Arthritis     Cancer [name])     Depression     Dyslipidemia     Essential hypertension 07/31/2015   Hypothyroidism 07/31/2015    doctor took her off medication         Past Surgical History:  Procedure Laterality Date   BLADDER SUSPENSION       CRANIOTOMY Right 03/04/2021    Procedure: CRANIOTOMY TUMOR RESECTION W/ BRAIN LAB;  Surgeon: Newman Pies, MD;  Location: [place];  Service: Neurosurgery;  Laterality: Right;   TONSILLECTOMY       TUBAL LIGATION             Family History  Problem Relation Age of Onset   Healthy Mother     Aneurysm Father     Hypertension Father     Hyperlipidemia Father     Healthy Sister     Healthy Daughter     Heart disease Son      Social History:  reports that she has been smoking cigarettes. She has a 28.50 pack-year smoking history. She has never used smokeless tobacco. She reports that she does not drink alcohol and does not use drugs. Allergies:      Allergies  Allergen Reactions   Ranitidine Anaphylaxis             Facility-Administered Medications Prior to Admission  Medication Dose Route Frequency Provider Last Rate Last Admin   ferrous sulfate tablet 325 mg  325 mg Oral BID WC Mellody Dance,  DO              Medications Prior to Admission  Medication Sig Dispense Refill   amitriptyline (ELAVIL) 100 MG tablet TAKE 1 TABLET BY MOUTH EVERYDAY AT BEDTIME (Patient taking differently: TAKE 2 TABLET BY MOUTH EVERYDAY AT BEDTIME) 60 tablet 2   atorvastatin (LIPITOR) 10 MG tablet Take 10 mg by mouth at bedtime.       cyclobenzaprine (FLEXERIL) 10 MG tablet Take 5-10 mg by mouth 3 (three) times daily as needed for muscle spasms.       escitalopram (LEXAPRO) 10 MG tablet Take 10 mg by mouth at bedtime.        Lacosamide 100 MG TABS Take 1 tablet (100 mg total) by mouth 2 (two) times daily. 60 tablet 2   levETIRAcetam (KEPPRA) 750 MG tablet TAKE 2 TABLETS (1,500 MG TOTAL) BY MOUTH 2 (TWO) TIMES DAILY. 360 tablet 1   lisinopril (ZESTRIL) 10 MG tablet Take 10 mg by mouth at bedtime.       prochlorperazine (COMPAZINE) 10 MG tablet Take 1 tablet (10 mg total) by mouth every 6 (six) hours as needed for nausea or vomiting. 30 tablet 1   temozolomide (TEMODAR) 100 MG capsule Take 1,600 mg by mouth as directed. May take on an empty stomach or at bedtime to decrease nausea & vomiting. Take 4 tablets (1600 mg) for 5 Days. Then Hold for 23 Days       dexamethasone (DECADRON) 2 MG tablet Take 1 tablet (2 mg total) by mouth daily. (Patient not taking: Reported on 03/07/2022) 7 tablet 0   fluconazole (DIFLUCAN) 100 MG tablet Take 1 tablet (100 mg total) by mouth daily. (Patient not taking: Reported on 03/07/2022) 7 tablet 0   lisinopril (ZESTRIL) 10 MG tablet Take 1 tablet (10 mg total) by mouth daily. 30 tablet 2          Home: Home Living Family/patient expects to be discharged to:: Private residence Living Arrangements: Spouse/significant other Available Help at Discharge: Family, Available 24 hours/day Type of Home: House Home Access: Stairs to enter [employer] of Steps: 5 brick steps Entrance Stairs-Rails: Right Home Layout: One level Bathroom Shower/Tub: Tub/shower unit Home Equipment: Conservation officer, nature (2 wheels) Additional Comments: husband is able to help as needed per pt report  Lives With: Spouse   Functional History: Prior Function Prior Level of Function : Independent/Modified Independent ADLs Comments: works full time as Chiropodist as Sports coach Status:  Mobility: Bed Mobility Overal bed mobility: Needs Assistance Bed Mobility: Rolling, Sidelying to Sit Rolling: Mod assist Sidelying to sit: Mod assist Supine to sit: Mod assist General bed  mobility comments: verbal and tactile cues for log roll technique, assist for LEs especially Lt LE due to hemiplegia Transfers Overall transfer level: Needs assistance Equipment used: Rolling walker (2 wheels) Transfers: Bed to chair/wheelchair/BSC Sit to Stand: Min assist, +2 physical assistance, +2 safety/equipment Bed to/from chair/wheelchair/BSC transfer type:: Squat pivot Squat pivot transfers: Min assist, +2 safety/equipment General transfer comment: pt with no strength in Lt LE so blocked knee for safety; transfers with chairs 45* angles so pt can use UEs on armrests to assist; also cues for w/c parts and monitoring Lt LE positioning; performed bed to w/c to recliner Wheelchair Mobility Wheelchair mobility: Yes Wheelchair propulsion: Both upper extremities Wheelchair parts: Needs assistance Distance: 180 Wheelchair Assistance Details (indicate cue type and reason): cues for brakes and using leg rest for L LE, cues for propelling and turning  w/c, assist for w/c with tight spaces (poor recall from previous session)   ADL: ADL Overall ADL's : Needs assistance/impaired Eating/Feeding: Set up, Sitting Grooming: Applying deodorant, Wash/dry face, Wash/dry hands, Supervision/safety, Bed level Upper Body Bathing: Set up, Bed level Lower Body Bathing: Total assistance, Bed level Upper Body Dressing : Set up, Bed level Lower Body Dressing: Bed level, Total assistance Lower Body Dressing Details (indicate cue type and reason): patient needed mod A for rolling to each side in bed. paient was able to cross RLE in figure four and don sock. Toilet Transfer: Minimal assistance, +2 for physical assistance, +2 for safety/equipment Toilet Transfer Details (indicate cue type and reason): with STEDY. patient unable to advance LLE with poor awareness of location on floor wiht patient attempting to cross feet and step with RLE over LLE with multiple attempts to weight shift and help advance LLE with  RW. Toileting- Clothing Manipulation and Hygiene: Total assistance, Bed level   Cognition: Cognition Overall Cognitive Status: Impaired/Different from baseline Orientation Level: Oriented X4 Cognition Arousal/Alertness: Awake/alert Behavior During Therapy: WFL for tasks assessed/performed, Impulsive Overall Cognitive Status: Impaired/Different from baseline General Comments: patient is plesant and cooperative. appears to have poor insight to LLE deficits at this time, requires cues for technique and safety as well as waiting for therapist to set up for transfers prior to initiating   Physical Exam: Blood pressure 127/85, pulse (!) 101, temperature (!) 97.5 F (36.4 C), temperature source Oral, resp. rate 16, height 5' 4"$  (1.626 m), weight 90.7 kg, SpO2 93 %. Physical Exam   Constitutional: No apparent distress. Appropriate appearance for age.  HENT: No JVD. Neck Supple. Trachea midline. Atraumatic, normocephalic. Eyes: PERRLA. EOMI. Visual fields grossly intact. + nystagmus and discomfort with R gaze Cardiovascular: RRR, no murmurs/rub/gallops. No Edema. Peripheral pulses 2+  Respiratory: CTAB. No rales, rhonchi, or wheezing. On RA.  Abdomen: + bowel sounds, normoactive. No distention or tenderness.  Skin: C/D/I. No apparent lesions. MSK:      No apparent deformity.      Strength:                RUE: 5/5 SA, 5/5 EF, 5/5 EE, 5/5 WE, 5/5 FF, 5/5 FA                 LUE: 5/5 SA, 5/5 EF, 5/5 EE, 5/5 WE, 5/5 FF, 5/5 FA                 RLE: 5/5 HF, 5/5 KE, 5/5 DF, 5/5 EHL, 5/5 PF                 LLE:  2/5 HF, 1+/5 KE, 0/5 DF, 3/5 EHL, 1/5 PF   Neurologic exam:  Cognition: AAO to person, place, time and event.  Language: Fluent, No substitutions or neoglisms. No dysarthria. Names 3/3 objects correctly.  Memory: Recalls 1/3 objects at 5 minutes. No apparent deficits  Insight: Good insight into current condition.  Mood: Pleasant affect, appropriate mood.  Sensation: Patchy sensory loss  on L from approx T5 down; normal sensation on R Reflexes: 2+ in BL; absent b/l patella. Negative Hoffman's, neutral babinski LLE.  CN: 2-12 grossly intact.  Coordination: Mild UE ataxia on FTN R>L. + Motor delay LLE Spasticity: MAS 0 in all extremities.      Blood pressure 127/85, pulse (!) 101, temperature (!) 97.5 F (36.4 C), temperature source Oral, resp. rate 16, height 5' 4"$  (1.626 m), weight 90.7 kg, SpO2 93 %.  Medical Problem List and Plan: 1. 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  Antithrombotics: -DVT/anticoagulation:  Pharmaceutical: Lovenox             -antiplatelet therapy: none   3. Pain Management:  -continue oxycodone 10 mg 5 x daily -continue Flexeril 5-10 mg q HS -continue Voltaren, Advil and Tylenol as needed   4. Mood/Behavior/Sleep: LCSW to evaluate and provide emotional support             -antipsychotic agents: n/a             -continue Lexapro and Elavil   5. Neuropsych/cognition: This patient is capable of making decisions on her own behalf.   6. Skin/Wound Care: Routine skin care checks   7. Fluids/Electrolytes/Nutrition: Routine Is and Os and follow-up chemistries   8: Hypertension: monitor TID and prn             -continue lisinopril 10 mg daily   9: Glioblastoma s/p resection, 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:             -continue Decadron 6 mg q 6 hours -continue Keppra 1500 mg BID and Vimpat 50 mg BID for seizure ppx  - XRT x10 followed by oral chemotherapy   10: Obesity, Class III  - Complicates medical decision making; nutrition onboard   11: GI prophylaxis: Protonix BID   12: Constipation vs neurogenic bowel: Given smog enema yesterday             -Senokot q HS             -Miralax BID 13. Urinary urgency vs. Neurogenic  bladder. Continent.   - UA 2/22 negative except for small HgB  Barbie Banner, PA-C 03/11/2022  I have examined the patient independently and edited the note for HPI, ROS, exam, assessment, and plan as appropriate. I am in agreement with the above recommendations.   Gertie Gowda, DO 03/13/2022

## 2022-03-13 NOTE — Progress Notes (Signed)
Patient arrived to CIR today. Admission/Assessment complete.  Yehuda Mao, LPN

## 2022-03-13 NOTE — Progress Notes (Addendum)
Inpatient Rehabilitation Admission Medication Review by a Pharmacist  A complete drug regimen review was completed for this patient to identify any potential clinically significant medication issues.  High Risk Drug Classes Is patient taking? Indication by Medication  Antipsychotic Yes Compazine prn N/V  Anticoagulant Yes Lovenox - VTE ppx  Antibiotic No   Opioid Yes Oxycodone - pain  Antiplatelet No   Hypoglycemics/insulin No   Vasoactive Medication Yes Lisinopril - BP  Chemotherapy No   Other Yes Methocarbamol / cyclobenzaprine prn muscle spasms Trazodone prn sleep Dexamethasone - cancer Amitriptyline - sleep Lexapro - mood Lacosamide, Keppra- seizure ppx Ibuprofen - pain Protonix - GERD     Type of Medication Issue Identified Description of Issue Recommendation(s)  Drug Interaction(s) (clinically significant)     Duplicate Therapy     Allergy     No Medication Administration End Date     Incorrect Dose     Additional Drug Therapy Needed     Significant med changes from prior encounter (inform family/care partners about these prior to discharge).    Other  Atorvastatin, oxycodone, temodar Resume at discharge for Temodar Resume others if appropriate at dc    Clinically significant medication issues were identified that warrant physician communication and completion of prescribed/recommended actions by midnight of the next day:  No  Pharmacist comments:  Atorvastatin 10 mg at bedtime not yet resumed Temodar 1600 mg for 5 days then hold for 23 days (currently not yet resumed)  Time spent performing this drug regimen review (minutes):  20 minutes  Thank you Anette Guarneri, PharmD

## 2022-03-13 NOTE — Progress Notes (Signed)
Inpatient Rehabilitation Admissions Coordinator   I have insurance approval and CIR bed to admit her to today. I spoke with patient by phone and she is in agreement. I have notified Dr Tana Coast, acute team and TOC. I will arrange [facility] transport when CIR room number verified. Dr Tressa Busman will be rehab MD to admit. [facility] staff notified to complete [facility] transport paperwork needed. I will make the arrangements to admit.  Danne Baxter, RN, MSN Rehab Admissions Coordinator [phone] 03/13/2022 1:13 PM

## 2022-03-13 NOTE — Discharge Summary (Signed)
Physician Discharge Summary   Patient: Kelly [name] MRN: [id number] DOB: 1965-11-16  Admit date:     03/07/2022  Discharge date: 03/13/22  Discharge Physician: Estill Cotta, MD    PCP: [facility]   Recommendations at discharge:   Continue pain control and bowel regimen Started on radiation treatments on 03/12/2022 Outpatient follow-up with Dr. 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 She experienced unusual sensations in the left lower extremity.MRI brain on 2/9 showed no new masses in the brain.   In ED > Examination revealed left lower extremity immobility and decreased sensation extending to the lower abdomen on the left side. She underwent Doppler ultrasound due to the swelling which was negative.    MRI of the spine > intrathecal, dural based versus intramedullary mass in the spinal cord, centered at T6, which measures 11 x 14 x 21 mm,concerning for metastatic disease from the patient's glioblastoma. This is associated with a syrinx extending superiorly from the mass to the level of T3-T4 and inferiorly to the level of T8-T9. 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   Started on Decadron and IV narcotics. Discussed in Tumor board. Simulation done 2/19, radiation treatment started on 03/12/2022 followed by chemotherapy.  Patient accepted to CIR.  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 -Continue pain control, bowel regimen -Per Dr. Renda Rolls recommendations on 03/10/2022 resume 5-day Temodar at 200 mg/m2 once discharged home,can reduce Decadron to 4 mg daily Please send a new prescription for Decadron and any change in oxycodone regimen at the time of final discharge from CIR  Constipation -Per patient, small BM yesterday - added smog enema x 1, continue MiraLAX, Senokot as, Dulcolax     HTN  -BP stable, continue lisinopril   Obesity, Estimated body mass index is 34.32 kg/m as calculated from the following:   Height as of this encounter: 5' 4"$  (1.626 m).   Weight as of this encounter: 90.7 kg.          Pain control - [employer] Controlled Substance Reporting System database was reviewed. and patient was instructed, not to drive, operate heavy machinery, perform activities at heights, swimming or participation in water activities or provide baby-sitting services while on Pain, Sleep and Anxiety Medications; until their outpatient Physician has advised to do so again. Also recommended to not to take more than prescribed Pain, Sleep and Anxiety Medications.  Consultants: Palliative medicine, CIR, neuro-oncology, Dr. Mickeal Skinner, neurosurgery, Dr. Christella Noa Procedures performed: None Disposition:  CIR Diet recommendation:  Discharge Diet Orders (From admission, onward)     Start     Ordered   03/13/22 0000  Diet general  03/13/22 1320            DISCHARGE MEDICATION: Allergies as of 03/13/2022       Reactions   Ranitidine Anaphylaxis        Medication List     TAKE these medications    acetaminophen 325 MG tablet Commonly known as:  TYLENOL Take 2 tablets (650 mg total) by mouth every 4 (four) hours as needed for fever, headache or mild pain.   amitriptyline 100 MG tablet Commonly known as: ELAVIL TAKE 1 TABLET BY MOUTH EVERYDAY AT BEDTIME What changed: additional instructions   atorvastatin 10 MG tablet Commonly known as: LIPITOR Take 10 mg by mouth at bedtime.   cyclobenzaprine 10 MG tablet Commonly known as: FLEXERIL Take 5-10 mg by mouth 3 (three) times daily as needed for muscle spasms.   dexamethasone 6 MG tablet Commonly known as: DECADRON Take 1 tablet (6 mg total) by mouth every 6 (six) hours.   diclofenac Sodium 1 % Gel Commonly known as: VOLTAREN Apply 2 g topically 4 (four) times daily.   escitalopram 10 MG tablet Commonly known as: LEXAPRO Take 10 mg by mouth at bedtime.   ibuprofen 400 MG tablet Commonly known as: ADVIL Take 1 tablet (400 mg total) by mouth every 6 (six) hours as needed for mild pain or headache.   Lacosamide 100 MG Tabs Take 1 tablet (100 mg total) by mouth 2 (two) times daily.   levETIRAcetam 750 MG tablet Commonly known as: KEPPRA TAKE 2 TABLETS (1,500 MG TOTAL) BY MOUTH 2 (TWO) TIMES DAILY.   lisinopril 10 MG tablet Commonly known as: ZESTRIL Take 10 mg by mouth at bedtime.   Oxycodone HCl 10 MG Tabs Take 1 tablet (10 mg total) by mouth every 4 (four) hours as needed for severe pain.   pantoprazole 40 MG tablet Commonly known as: PROTONIX Take 1 tablet (40 mg total) by mouth 2 (two) times daily before a meal.   polyethylene glycol 17 g packet Commonly known as: MIRALAX / GLYCOLAX Take 17 g by mouth 2 (two) times daily.   prochlorperazine 10 MG tablet Commonly known as: COMPAZINE Take 1 tablet (10 mg total) by mouth every 6 (six) hours as needed for nausea or vomiting.   senna-docusate 8.6-50 MG tablet Commonly known as: Senokot-S Take 2 tablets by mouth at bedtime.   temozolomide 100 MG capsule Commonly known as: TEMODAR Take 1,600 mg by mouth as  directed. May take on an empty stomach or at bedtime to decrease nausea & vomiting. Take 4 tablets (1600 mg) for 5 Days. 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  A walker will not resolve  issue with performing activities of daily living. A wheelchair will allow patient to safely perform daily activities. Patient is not able to propel themselves in the home using a standard weight wheelchair due to endurance. Patient can self propel in the lightweight wheelchair. Length of need Lifetime. Accessories: elevating leg rests (  ELRs), wheel locks, extensions and anti-tippers.   03/09/22 1351            Follow-up Information     [facility]. Schedule an appointment as soon as possible for a visit in 2 week(s).   Specialty: Internal Medicine Why: for hospital follow-up Contact information: [place] [address] [phone]                Discharge Exam: Danley Danker Weights   03/08/22 0100  Weight: 90.7 kg   S: No acute complaints except constipation, looking forward to rehab.   BP 122/88 (BP Location: Right Arm)   Pulse 86   Temp 97.6 F (36.4 C)   Resp 18   Ht 5' 4"$  (1.626 m)   Wt 90.7 kg   SpO2 91%   BMI 34.32 kg/m   Physical Exam General: Alert and oriented x 3, NAD Cardiovascular: S1 S2 clear, RRR.  Respiratory: CTAB, no wheezing, rales or rhonchi Gastrointestinal: Soft, nontender, nondistended, NBS Ext: no pedal edema bilaterally Neuro: strength and sensation decreased in the left leg and foot Psych: Normal affect,  pleasant     Condition at discharge: fair  The results of significant diagnostics from this hospitalization (including imaging, microbiology, ancillary and laboratory) are listed below for reference.   Imaging Studies: MR TOTAL SPINE METS SCREENING  Result Date: 03/07/2022 CLINICAL DATA:  Left leg swelling, unable to move left foot, history of glioblastoma chest EXAM: MRI TOTAL SPINE WITHOUT AND WITH CONTRAST TECHNIQUE: Multisequence MR imaging of the spine from the cervical spine to the sacrum was performed prior to and following IV contrast administration for evaluation of spinal metastatic disease. CONTRAST:  53m GADAVIST GADOBUTROL 1 MMOL/ML IV SOLN COMPARISON:  None Available. FINDINGS: MRI CERVICAL SPINE FINDINGS Alignment: Physiologic. Vertebrae: No fracture, evidence of discitis, or bone lesion. No abnormal enhancement. Cord: Normal signal and morphology.  No abnormal enhancement. Posterior Fossa, vertebral arteries, paraspinal tissues: Please see MRI brain 02/28/2022 for intracranial findings. Disc levels: Limited imaging of the cervical spine demonstrates no significant spinal canal stenosis. MRI THORACIC SPINE FINDINGS Alignment:  No listhesis. Vertebrae: No fracture, evidence of discitis, or bone lesion. No abnormal enhancement. Cord: Enhancing, intrathecal, dural based or intramedullary lesion in the spinal cord, centered at T6, which measures 11 x 14 x 21 mm (AP x TR x CC) (series 24, image 24 and series 21, image 7). The mass extends towards the left neural foramen at T6-T7. This is associated with a syrinx extending superiorly from the mass to the level of T3-T4 and inferiorly to the level of T8-T9 (series 16, image 8). Minimal normal spinal cord signal is noted at the right aspect of the spinal canal (series 24, image 24). Somewhat nodular enhancement along the dorsal aspect of the spinal cord at T3 (series 24, image 8 and series 21, image 8), which may indicate additional leptomeningeal  disease or be vascular. Paraspinal and other soft tissues: Not well evaluated due to respiratory motion artifact. Large hiatal hernia. Disc levels: No osseous spinal canal stenosis. MRI LUMBAR SPINE FINDINGS Segmentation:  5 lumbar type vertebral bodies. Alignment:  No significant listhesis. Vertebrae: No fracture, evidence of discitis, or bone lesion. No abnormal enhancement. Conus medullaris: Extends to the L1 level and appears normal. No abnormal enhancement. Paraspinal and other soft tissues: No acute finding on the limited series. Disc levels: No osseous spinal canal stenosis. IMPRESSION: 1. Enhancing, intrathecal, dural based versus intramedullary mass in the spinal cord,  centered at T6, which measures 11 x 14 x 21 mm, concerning for metastatic disease from the patient's glioblastoma. A this is associated with a syrinx extending superiorly from the mass to the level of T3-T4 and inferiorly to the level of T8-T9. 2. Additional somewhat nodular enhancement along the dorsal aspect of the spinal cord at T3, which may indicate additional leptomeningeal disease or be vascular. 3. No additional enhancing lesions are seen in the cervical, thoracic, or lumbar spine. These results were called by telephone at the time of interpretation on 03/07/2022 at 10:00 pm to provider [facility] , who verbally acknowledged these results. Electronically Signed   By: Merilyn Baba M.D.   On: 03/07/2022 22:00   VAS [place] LOWER EXTREMITY VENOUS (DVT) (7a-7p)  Result Date: 03/07/2022  Lower Venous DVT Study Patient Name:  Kelly [name]  Date of Exam:   03/07/2022 Medical Rec #: [id number]       Accession #:    [id number] Date of Birth: Mar 27, 1965      Patient Gender: F Patient Age:   31 years Exam Location:  [facility] Procedure:      VAS [place] LOWER EXTREMITY VENOUS (DVT) Referring Phys: Dorise Bullion --------------------------------------------------------------------------------  Indications: Pain.  Risk Factors:  Cancer. Limitations: Poor ultrasound/tissue interface. Comparison Study: No prior studies. Performing Technologist: Oliver Hum RVT  Examination Guidelines: A complete evaluation includes B-mode imaging, spectral Doppler, color Doppler, and power Doppler as needed of all accessible portions of each vessel. Bilateral testing is considered an integral part of a complete examination. Limited examinations for reoccurring indications may be performed as noted. The reflux portion of the exam is performed with the patient in reverse Trendelenburg.  +-----+---------------+---------+-----------+----------+--------------+ RIGHTCompressibilityPhasicitySpontaneityPropertiesThrombus Aging +-----+---------------+---------+-----------+----------+--------------+ CFV  Full           Yes      Yes                                 +-----+---------------+---------+-----------+----------+--------------+   +---------+---------------+---------+-----------+----------+--------------+ LEFT     CompressibilityPhasicitySpontaneityPropertiesThrombus Aging +---------+---------------+---------+-----------+----------+--------------+ CFV      Full           Yes      Yes                                 +---------+---------------+---------+-----------+----------+--------------+ SFJ      Full                                                        +---------+---------------+---------+-----------+----------+--------------+ FV Prox  Full                                                        +---------+---------------+---------+-----------+----------+--------------+ FV Mid   Full                                                        +---------+---------------+---------+-----------+----------+--------------+  FV DistalFull                                                        +---------+---------------+---------+-----------+----------+--------------+ PFV      Full                                                         +---------+---------------+---------+-----------+----------+--------------+ POP      Full           Yes      Yes                                 +---------+---------------+---------+-----------+----------+--------------+ PTV      Full                                                        +---------+---------------+---------+-----------+----------+--------------+ PERO     Full                                                        +---------+---------------+---------+-----------+----------+--------------+    Summary: RIGHT: - No evidence of common femoral vein obstruction.  LEFT: - There is no evidence of deep vein thrombosis in the lower extremity.  - No cystic structure found in the popliteal fossa.  *See table(s) above for measurements and observations. Electronically signed by Deitra Mayo MD on 03/07/2022 at 5:08:36 PM.    Final    MR BRAIN W WO CONTRAST  Result Date: 03/03/2022 CLINICAL DATA:  Brain/CNS neoplasm, assess treatment response EXAM: MRI HEAD WITHOUT AND WITH CONTRAST TECHNIQUE: Multiplanar, multiecho pulse sequences of the brain and surrounding structures were obtained without and with intravenous contrast. CONTRAST:  10m GADAVIST GADOBUTROL 1 MMOL/ML IV SOLN COMPARISON:  MRI 12/31/2021. FINDINGS: Brain: Continued interval collapse of the enhancing mass centered in the anterior right temporal lobe with mildly decreased conspicuity of enhancement. No new or progressive enhancement. Mild surrounding T2/FLAIR hyperintensities in the adjacent white matter, suspicious for infiltrating neoplasm. No evidence of acute hemorrhage, acute infarct, midline shift or hydrocephalus. Vascular: Major arterial flow voids are maintained. Skull and upper cervical spine: No acute findings. Sinuses/Orbits: Clear sinuses.  No acute orbital findings. Other: No mastoid effusions. IMPRESSION: Continued slight contraction and decrease in size and conspicuity of  enhancement of the anterior right temporal lobe lesion. Similar surrounding T2/FLAIR hyperintensity. No new or progressive findings. Electronically Signed   By: FMargaretha SheffieldM.D.   On: 03/03/2022 08:27    Microbiology: Results for orders placed or performed during the hospital encounter of 03/01/21  Resp Panel by RT-PCR (Flu A&B, Covid) Nasopharyngeal Swab     Status: None   Collection Time: 03/01/21  6:16 PM   Specimen: Nasopharyngeal Swab; Nasopharyngeal(NP) swabs in vial transport medium  Result Value Ref Range  Status   SARS Coronavirus 2 by RT PCR NEGATIVE NEGATIVE Final    Comment: (NOTE) SARS-CoV-2 target nucleic acids are NOT DETECTED.  The SARS-CoV-2 RNA is generally detectable in upper respiratory specimens during the acute phase of infection. The lowest concentration of SARS-CoV-2 viral copies this assay can detect is 138 copies/mL. A negative result does not preclude SARS-Cov-2 infection and should not be used as the sole basis for treatment or other patient management decisions. A negative result may occur with  improper specimen collection/handling, submission of specimen other than nasopharyngeal swab, presence of viral mutation(s) within the areas targeted by this assay, and inadequate number of viral copies(<138 copies/mL). A negative result must be combined with clinical observations, patient history, and epidemiological information. The expected result is Negative.  Fact Sheet for Patients:  EntrepreneurPulse.com.au  Fact Sheet for Healthcare Providers:  IncredibleEmployment.be  This test is no t yet approved or cleared by the [place] FDA and  has been authorized for detection and/or diagnosis of SARS-CoV-2 by FDA under an Emergency Use Authorization (EUA). This EUA will remain  in effect (meaning this test can be used) for the duration of the COVID-19 declaration under Section 564(b)(1) of the Act, 21 U.S.C.section  360bbb-3(b)(1), unless the authorization is terminated  or revoked sooner.       Influenza A by PCR NEGATIVE NEGATIVE Final   Influenza B by PCR NEGATIVE NEGATIVE Final    Comment: (NOTE) The Xpert Xpress SARS-CoV-2/FLU/RSV plus assay is intended as an aid in the diagnosis of influenza from Nasopharyngeal swab specimens and should not be used as a sole basis for treatment. Nasal washings and aspirates are unacceptable for Xpert Xpress SARS-CoV-2/FLU/RSV testing.  Fact Sheet for Patients: EntrepreneurPulse.com.au  Fact Sheet for Healthcare Providers: IncredibleEmployment.be  This test is not yet approved or cleared by the [place] FDA and has been authorized for detection and/or diagnosis of SARS-CoV-2 by FDA under an Emergency Use Authorization (EUA). This EUA will remain in effect (meaning this test can be used) for the duration of the COVID-19 declaration under Section 564(b)(1) of the Act, 21 U.S.C. section 360bbb-3(b)(1), unless the authorization is terminated or revoked.  Performed at [facility], [place] [address], [place], [place] [address]   Culture, blood (routine x 2)     Status: None   Collection Time: 03/01/21  9:30 PM   Specimen: BLOOD LEFT HAND  Result Value Ref Range Status   Specimen Description BLOOD LEFT HAND  Final   Special Requests   Final    BOTTLES DRAWN AEROBIC AND ANAEROBIC Blood Culture adequate volume   Culture   Final    NO GROWTH 5 DAYS Performed at [facility], [place] [address], [place], [place] [address]    Report Status 03/06/2021 FINAL  Final  Culture, blood (routine x 2)     Status: None   Collection Time: 03/01/21  9:36 PM   Specimen: BLOOD  Result Value Ref Range Status   Specimen Description BLOOD LEFT ANTECUBITAL  Final   Special Requests   Final    BOTTLES DRAWN AEROBIC AND ANAEROBIC Blood Culture adequate volume   Culture   Final    NO GROWTH 5 DAYS Performed at [facility], [place] [address] [place], [place] [address]    Report Status 03/06/2021 FINAL  Final  Surgical PCR screen     Status: None   Collection Time: 03/02/21  5:52 PM   Specimen: Nasal Mucosa; Nasal Swab  Result Value Ref Range Status   MRSA, PCR NEGATIVE NEGATIVE Final   Staphylococcus aureus NEGATIVE NEGATIVE Final    Comment: (NOTE) The Xpert SA Assay (FDA approved for NASAL specimens in patients 76 years of age and older), is one component of a comprehensive surveillance program. It is not intended to diagnose infection nor to guide or monitor treatment. Performed at [facility], [place] [address], [place], [place] [address]   MRSA Next Gen by PCR, Nasal     Status: None   Collection Time: 03/04/21  9:53 PM   Specimen: Nasal Mucosa; Nasal Swab  Result Value Ref Range Status   MRSA by PCR Next Gen NOT DETECTED NOT DETECTED Final    Comment: (NOTE) The GeneXpert MRSA Assay (FDA approved for NASAL specimens only), is one component of a comprehensive MRSA colonization surveillance program. It is not intended to diagnose MRSA infection nor to guide or monitor treatment for MRSA infections. Test performance is not FDA approved in patients less than 49 years old. Performed at [facility], [place] [address], [place], [place] [address]     Labs: CBC: Recent Labs  Lab 03/07/22 1558 03/08/22 0319  WBC 5.8 6.7  NEUTROABS 4.2  --   HGB 13.9 14.1  HCT 41.7 41.2  MCV 105.3* 103.5*  PLT 176 [id number]   Basic Metabolic Panel: Recent Labs  Lab 03/07/22 1558 03/08/22 0319  NA 135 136  K 3.9 3.9  CL 99 101  CO2 27 24  GLUCOSE 99 116*  BUN 13 10  CREATININE 0.79 0.68  CALCIUM 9.2 8.9   Liver Function Tests: No results for input(s): "AST", "ALT", "ALKPHOS", "BILITOT", "PROT", "ALBUMIN" in the last 168 hours. CBG: No results for input(s): "GLUCAP" in the last 168 hours.  Discharge time spent: greater than 30 minutes.  Signed: Estill Cotta, MD [facility] 03/13/2022

## 2022-03-13 NOTE — Progress Notes (Signed)
Gertie Gowda, DO  Physician Physical Medicine and Rehabilitation   PMR Pre-admission    Signed   Date of Service: 03/10/2022  2:07 PM  Related encounter: ED to Hosp-Admission (Discharged) from 03/07/2022 in [place] all [x]$ Written[x]$ Templated[x]$ Copied  Added by: [x]$ Cristina Gong, RN[x]$ Adrian, Kristyn H[x]$ Gertie Gowda, DO  []$ Hover for details PMR Admission Coordinator Pre-Admission Assessment   Patient: Kelly [name] is an 57 y.o., female MRN: [id number] DOB: 1965-02-03 Height: 5' 4"$  (162.6 cm) Weight: 90.7 kg   Insurance Information HMO:     PPO:      PCP:      IPA:      80/20:      OTHER:  PRIMARY: Medicaid Healthy Blue      Policy#: [id number]      Subscriber: Patient CM Name: via fax approval      Phone#: [phone]     Fax#: [phone] was number given for Auth approval Pre-Cert#: [id number]  approved 2/22 until 3/2    Employer:  Benefits:  Phone #: [phone]     Name: 2/19 Eff. Date: 12/20/21 until 11/20/22     Deduct: none      Out of Pocket Max: none CIR: per Medicaid guidelines      SNF: per medicaid guidelines Outpatient: $40 per visit     Co-Pay: 27 visits combined Home Health: per Medicaid guidelines      Co-Pay:  DME: per Medicaid guidelines     Co-Pay:  Providers: in network  SECONDARY: none   Financial Counselor:       Phone#:    The Engineer, petroleum" for patients in Inpatient Rehabilitation Facilities with attached "Privacy Act [place] Records" was provided and verbally reviewed with: N/A   Emergency Contact Information Contact Information       Name Relation Home Work Mobile    Tinkham,Tim Spouse     [phone]    [place]on Reek     [phone]         Current Medical History    Patient Admitting Diagnosis: Metastatic cancer to spine   History of Present Illness: Kelly [name], a 57 year old female with a history of  glioblastoma post craniotomy 2023, underwent chemotherapy and radiation, presents on 03/07/22 to [facility] with sudden left lower extremity weakness and swelling noticed four days ago.  She experienced unusual sensations in the left lower extremity, with no prior history of clots. Examination revealed left lower extremity immobility and decreased sensation extending to the lower abdomen on the left side.   MRI of the spine revealed intrathecal, dural based versus intramedullary mass in the spinal cord, centered at T6, which measures 11 x 14 x 21 mm,concerning for metastatic disease from the patient's glioblastoma. This is associated with a syrinx extending superiorly from the mass to the level of T3-T4 and inferiorly to the level of T8-T9.Nodular enhancement  at T3, which may indicate additional leptomeningeal disease or be vascular. Began on Decadron and IV narcotics. Oncology and Neurosurgery consulted. Discussed at Tumor board. Neurosurgery felt the mass was not resectable with any expectation that she would maintain lower extremity function.   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 Began Keppra and Vimpat. Radiation simulation began and started XRT with plan for 10 treatments followed by oral Chemo.    Noted constipation and began bowel regimen  with small results. Continue lisinopril for HTN. Lovenox for DVT prophylaxis. Palliative care consulted for goals of care. Remains full code.       Patient's medical record from [facility] has been reviewed by the rehabilitation admission coordinator and physician.   Past Medical History      Past Medical History:  Diagnosis Date   Arthritis     Cancer ([place])     Depression     Dyslipidemia     Essential hypertension 07/31/2015   Hypothyroidism 07/31/2015    doctor took her off medication    Has the patient had major surgery during 100 days prior to  admission? No   Family History   family history includes Aneurysm in her father; Healthy in her daughter, mother, and sister; Heart disease in her son; Hyperlipidemia in her father; Hypertension in her father.   Current Medications   Current Facility-Administered Medications:    acetaminophen (TYLENOL) tablet 650 mg, 650 mg, Oral, Q4H PRN, Rai, Ripudeep K, MD   amitriptyline (ELAVIL) tablet 100 mg, 100 mg, Oral, QHS, Garba, Mohammad L, MD, 100 mg at 03/12/22 2238   bisacodyl (DULCOLAX) suppository 10 mg, 10 mg, Rectal, Daily PRN, Debbe Odea, MD, 10 mg at 03/12/22 1349   cyclobenzaprine (FLEXERIL) tablet 5-10 mg, 5-10 mg, Oral, QHS, Garba, Mohammad L, MD, 10 mg at 03/12/22 2238   dexamethasone (DECADRON) tablet 6 mg, 6 mg, Oral, Q6H, Rizwan, Saima, MD, 6 mg at 03/13/22 1111   diclofenac Sodium (VOLTAREN) 1 % topical gel 2 g, 2 g, Topical, QID, Rizwan, Saima, MD, 2 g at 03/13/22 0915   enoxaparin (LOVENOX) injection 40 mg, 40 mg, Subcutaneous, Q24H, Rizwan, Saima, MD, 40 mg at 03/13/22 1111   escitalopram (LEXAPRO) tablet 10 mg, 10 mg, Oral, QHS, Garba, Mohammad L, MD, 10 mg at 03/12/22 2239   guaiFENesin-dextromethorphan (ROBITUSSIN DM) 100-10 MG/5ML syrup 5 mL, 5 mL, Oral, Q4H PRN, Debbe Odea, MD, 5 mL at 03/10/22 1018   HYDROmorphone (DILAUDID) injection 1 mg, 1 mg, Intravenous, Q2H PRN, Debbe Odea, MD   ibuprofen (ADVIL) tablet 400 mg, 400 mg, Oral, Q6H PRN, Rai, Ripudeep K, MD, 400 mg at 03/13/22 1114   lacosamide (VIMPAT) tablet 100 mg, 100 mg, Oral, BID, [facility], Mohammad L, MD, 100 mg at 03/13/22 0915   levETIRAcetam (KEPPRA) tablet 1,500 mg, 1,500 mg, Oral, BID, [facility], Mohammad L, MD, 1,500 mg at 03/13/22 0915   lisinopril (ZESTRIL) tablet 10 mg, 10 mg, Oral, Daily, Rizwan, Saima, MD, 10 mg at 03/12/22 2239   ondansetron (ZOFRAN) tablet 4 mg, 4 mg, Oral, Q6H PRN **OR** ondansetron (ZOFRAN) injection 4 mg, 4 mg, Intravenous, Q6H PRN, [facility], Mohammad L, MD   Oral care mouth rinse,  15 mL, Mouth Rinse, PRN, [facility], Mohammad L, MD   oxyCODONE (Oxy IR/ROXICODONE) immediate release tablet 10 mg, 10 mg, Oral, 5 X Daily, Rizwan, Saima, MD, 10 mg at 03/13/22 0915   pantoprazole (PROTONIX) EC tablet 40 mg, 40 mg, Oral, BID, Rizwan, Saima, MD, 40 mg at 03/13/22 0915   polyethylene glycol (MIRALAX / GLYCOLAX) packet 17 g, 17 g, Oral, BID, Rai, Ripudeep K, MD, 17 g at 03/13/22 0914   senna-docusate (Senokot-S) tablet 2 tablet, 2 tablet, Oral, QHS, Rai, Ripudeep K, MD   sorbitol, milk of mag, mineral oil, glycerin (SMOG) enema, 960 mL, Rectal, Once, Rai, Ripudeep K, MD   Patients Current Diet:  Diet Order  Diet general             Diet regular Room service appropriate? Yes; Fluid consistency: Thin  Diet effective now                       Precautions / Restrictions Precautions Precautions: Fall, Back Precaution Booklet Issued: No Precaution Comments: L LE hemplegia Restrictions Weight Bearing Restrictions: No    Has the patient had 2 or more falls or a fall with injury in the past year? 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 Noted leg issues beginning in February   Prior Functional Level Self Care: Did the patient need help bathing, dressing, using the toilet or eating? Needed some help   Indoor Mobility: Did the patient need assistance with walking from room to room (with or without device)? Needed some help   Stairs: Did the patient need assistance with internal or external stairs (with or without device)? Needed some help   Functional Cognition: Did the patient need help planning regular tasks such as shopping or remembering to take medications? Needed some help   Patient Information Are you of Hispanic, Latino/a,or Spanish origin?: A. No, not of Hispanic, Latino/a, or Spanish origin What is your race?: A. White Do you need or want an interpreter to communicate with a  doctor or health care staff?: 0. No   Patient's Response To:  Health Literacy and Transportation Is the patient able to respond to health literacy and transportation needs?: Yes Health Literacy - How often do you need to have someone help you when you read instructions, pamphlets, or other written material from your doctor or pharmacy?: Never In the past 12 months, has lack of transportation kept you from medical appointments or from getting medications?: No In the past 12 months, has lack of transportation kept you from meetings, work, or from getting things needed for daily living?: No   Development worker, international aid / [place] Devices/Equipment: Blood pressure cuff, Shower chair without back, Other (Comment), Wheelchair (Reading Glasses) Home Equipment: Conservation officer, nature (2 wheels)   Prior Device Use: Indicate devices/aids used by the patient prior to current illness, exacerbation or injury? Manual wheelchair   Current Functional Level Cognition   Overall Cognitive Status: Impaired/Different from baseline Orientation Level: Oriented X4 General Comments: patient is plesant and very impulsive with pooor insight to deficits with LLE. patient needs extensive cues to slow down. husband present during session needing continued education as well at this time.    Extremity Assessment (includes Sensation/Coordination)   Upper Extremity Assessment: Overall WFL for tasks assessed  Lower Extremity Assessment: LLE deficits/detail LLE Deficits / Details: 0/5 throughout, only able to wiggle her toes; reports hyperesthesia     ADLs   Overall ADL's : Needs assistance/impaired Eating/Feeding: Set up, Sitting Grooming: Applying deodorant, Wash/dry face, Wash/dry hands, Supervision/safety, Bed level Upper Body Bathing: Set up, Bed level Lower Body Bathing: Total assistance, Bed level Upper Body Dressing : Set up, Bed level Lower Body Dressing: Bed level, Total assistance Lower Body Dressing  Details (indicate cue type and reason): patient needed mod A for rolling to each side in bed. paient was able to cross RLE in figure four and don sock. Toilet Transfer: Minimal assistance, +2 for physical assistance, +2 for safety/equipment, BSC/3in1 Toilet Transfer Details (indicate cue type and reason): with STEDY. patient unable to advance LLE with poor awareness of location on floor wiht  patient attempting to cross feet and step with RLE over LLE with multiple attempts to weight shift and help advance LLE with RW. Toileting- Clothing Manipulation and Hygiene: Total assistance, Sit to/from stand Toileting - Clothing Manipulation Details (indicate cue type and reason): on STEDY with continued cues.     Mobility   Overal bed mobility: Needs Assistance Bed Mobility: Rolling, Sidelying to Sit Rolling: Mod assist Sidelying to sit: Mod assist Supine to sit: Mod assist General bed mobility comments: verbal and tactile cues for log roll technique, assist for LEs especially Lt LE due to hemiplegia     Transfers   Overall transfer level: Needs assistance Equipment used: Rolling walker (2 wheels) Transfers: Sit to/from Stand, Bed to chair/wheelchair/BSC Sit to Stand: Min assist, +2 physical assistance, +2 safety/equipment Bed to/from chair/wheelchair/BSC transfer type:: Squat pivot Squat pivot transfers: Min assist, +2 safety/equipment General transfer comment: pt with no strength in Lt LE so blocked knee for safety; transfers with chairs 45* angles so pt can use UEs on armrests to assist; also cues for w/c parts and monitoring Lt LE positioning; performed bed to w/c to recliner     Ambulation / Gait / Stairs / Investment banker, corporate mobility: Yes Wheelchair propulsion: Both upper extremities Wheelchair parts: Needs assistance Distance: 180 Wheelchair Assistance Details (indicate cue type and reason): cues for brakes and using leg rest for L LE, cues for propelling  and turning w/c, assist for w/c with tight spaces (poor recall from previous session)     Posture / Balance Balance Overall balance assessment: Needs assistance Sitting-balance support: Feet supported Sitting balance-Leahy Scale: Fair Standing balance support: Bilateral upper extremity supported, During functional activity, Reliant on assistive device for balance Standing balance-Leahy Scale: Poor Standing balance comment: reliant on UE support as L LE has no strength or tone to weight bear     Special needs/care consideration Bowel and bladder issues 10 radiation treatments began 2/21 until 3/4 Palliative for [place] discussions [facility] transport arranged for daily radiation at [facility].  Appointment time is: 2/23  Fri       2:25 pm 2/26  Mon    4 pm 2/27  Tues   4 pm 2/28  Wed    2 pm 2/29  Thurs  2 pm 3/1    Fri       2 pm 3/4    Mon    2 pm 3/5    Tues   2 pm       Previous Home Environment  Living Arrangements: Spouse/significant other  Lives With: Spouse Available Help at Discharge: Family, Available 24 hours/day Type of Home: House Home Layout: One level Home Access: Stairs to enter Entrance Stairs-Rails: Right Entrance Stairs-Number of Steps: 5 brick steps Bathroom Shower/Tub: Tub/shower unit Home Care Services: No Additional Comments: husband is able to help as needed per pt report   Discharge Living Setting Plans for Discharge Living Setting: Patient's home, Lives with (comment) (spouse) Discharge Home Layout: One level Discharge Home Access: Stairs to enter Entrance Stairs-Rails: Right Entrance Stairs-Number of Steps: 5 Discharge Bathroom Shower/Tub: Tub/shower unit Does the patient have any problems obtaining your medications?: No   Social/Family/Support Systems Anticipated Caregiver: Spouse, Niela Raible Anticipated Ambulance person Information: [phone] Caregiver Availability: 24/7 Is Caregiver In Agreement with Plan?: Yes Does  Caregiver/Family have Issues with Lodging/Transportation while Pt is in Rehab?: No   Goals Patient/Family Goal for Rehab: min A OT/PT Expected length of stay: 7-10 days  Pt/Family Agrees to Admission and willing to participate: Yes Program Orientation Provided & Reviewed with Pt/Caregiver Including Roles  & Responsibilities: Yes  Barriers to Discharge: Insurance for SNF coverage Would hope that radiation treatments are complete before discharge home. Goal for min assist transfer level to wheelchair. Daughter is a Therapist, sports. 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 After radiation     complete, will be on po chemo per patient.   Decrease burden of Care through IP rehab admission: n/a   Possible need for SNF placement upon discharge: not anticipated   Patient Condition: I have reviewed medical records from [facility], spoken with  TOC , and patient and spouse. I discussed via phone for inpatient rehabilitation assessment.  Patient will benefit from ongoing PT and OT, can actively participate in 3 hours of therapy a day 5 days of the week, and can make measurable gains during the admission.  Patient will also benefit from the coordinated team approach during an Inpatient Acute Rehabilitation admission.  The patient will receive intensive therapy as well as Rehabilitation physician, nursing, social worker, and care management interventions.  Due to safety, skin/wound care, disease management, medication administration, pain management, and patient education the patient requires 24 hour a day rehabilitation nursing.  The patient is currently mod assist overall at wheelchair level with mobility and basic ADLs.  Discharge setting and therapy post discharge at home with home health is anticipated.  Patient has agreed to participate in the Acute Inpatient Rehabilitation Program and will admit today.   Preadmission Screen Completed By:  Cleatrice Burke, 03/13/2022 1:40  PM ______________________________________________________________________   Discussed status with Dr. Tressa Busman on 03/13/2022 at 1343 and received approval for admission today.   Admission Coordinator:  Cleatrice Burke, RN, time [id number] Date 03/13/2022    Assessment/Plan: Diagnosis: Does the need for close, 24 hr/day Medical supervision in concert with the patient's rehab needs make it unreasonable for this patient to be served in a less intensive setting? Yes Co-Morbidities requiring supervision/potential complications: T6 NTSCI due to metastatic disease, glioblastoma s/p craniotomy now starting radiation therapy, IV steroids with hyperglycemia, constipation, and hypertension. Due to bladder management, bowel management, safety, skin/wound care, disease management, medication administration, pain management, and patient education, does the patient require 24 hr/day rehab nursing? Yes Does the patient require coordinated care of a physician, rehab nurse, PT, OT to address physical and functional deficits in the context of the above medical diagnosis(es)? Yes Addressing deficits in the following areas: balance, endurance, locomotion, strength, transferring, bowel/bladder control, bathing, dressing, feeding, grooming, and toileting Can the patient actively participate in an intensive therapy program of at least 3 hrs of therapy 5 days a week? Yes The potential for patient to make measurable gains while on inpatient rehab is good Anticipated functional outcomes upon discharge from inpatient rehab: min assist PT, min assist OT,  Estimated rehab length of stay to reach the above functional goals is: 7-10 days Anticipated discharge destination: Home 10. Overall Rehab/Functional Prognosis: good     MD Signature:   Gertie Gowda, DO 03/13/2022          Revision History

## 2022-03-13 NOTE — TOC Transition Note (Signed)
Transition of Care [facility]) - CM/SW Discharge Note  Patient Details  Name: Gretchyn Brackins MRN: [id number] Date of Birth: 28-Sep-1965  [facility] CM/SW Contact:  Sherie Don, LCSW Phone Number: 03/13/2022, 1:27 PM  Clinical Narrative: Patient has been approved for CIR and will be transported there today via [facility]. TOC signing off.    Final next level of care: IP Rehab Facility Barriers to Discharge: Barriers Resolved  Patient Goals and CMS Choice CMS Medicare.gov Compare Post Acute Care list provided to:: Patient Choice offered to / list presented to : Patient, Parent  Discharge Placement         Patient chooses bed at: Other - please specify in the comment section below: ([facility]) Patient to be transferred to facility by: [facility] Patient and family notified of of transfer: 03/13/22  Discharge Plan and Services Additional resources added to the After Visit Summary for   In-house Referral: Clinical Social Work Post Acute Care Choice: IP Rehab          DME Arranged: N/A DME Agency: NA  Social Determinants of Health (SDOH) Interventions SDOH Screenings   Food Insecurity: No Food Insecurity (03/08/2022)  Housing: Low Risk  (03/08/2022)  Transportation Needs: No Transportation Needs (03/08/2022)  Utilities: Not At Risk (03/08/2022)  Financial Resource Strain: Low Risk  (08/19/2021)  Social Connections: Moderately Isolated (08/19/2021)  Tobacco Use: High Risk (03/07/2022)   Readmission Risk Interventions     No data to display

## 2022-03-14 ENCOUNTER — Ambulatory Visit
Admit: 2022-03-14 | Discharge: 2022-03-14 | Disposition: A | Discharge status: Home / Self Care | Payer: Medicaid Other | Attending: Radiation Oncology | Admitting: Radiation Oncology

## 2022-03-14 ENCOUNTER — Other Ambulatory Visit: Payer: Self-pay

## 2022-03-14 ENCOUNTER — Ambulatory Visit: Payer: Medicaid Other

## 2022-03-14 DIAGNOSIS — I10 Essential (primary) hypertension: Secondary | ICD-10-CM

## 2022-03-14 DIAGNOSIS — M25561 Pain in right knee: Secondary | ICD-10-CM

## 2022-03-14 DIAGNOSIS — S24109S Unspecified injury at unspecified level of thoracic spinal cord, sequela: Secondary | ICD-10-CM | POA: Diagnosis not present

## 2022-03-14 DIAGNOSIS — K5900 Constipation, unspecified: Secondary | ICD-10-CM

## 2022-03-14 DIAGNOSIS — M25562 Pain in left knee: Secondary | ICD-10-CM

## 2022-03-14 DIAGNOSIS — D72829 Elevated white blood cell count, unspecified: Secondary | ICD-10-CM

## 2022-03-14 DIAGNOSIS — C712 Malignant neoplasm of temporal lobe: Secondary | ICD-10-CM | POA: Diagnosis present

## 2022-03-14 DIAGNOSIS — Z510 Encounter for antineoplastic radiation therapy: Secondary | ICD-10-CM | POA: Diagnosis present

## 2022-03-14 LAB — COMPREHENSIVE METABOLIC PANEL
ALT: 26 U/L (ref 0–44)
AST: 16 U/L (ref 15–41)
Albumin: 2.7 g/dL — ABNORMAL LOW (ref 3.5–5.0)
Alkaline Phosphatase: 75 U/L (ref 38–126)
Anion gap: 17 — ABNORMAL HIGH (ref 5–15)
BUN: 20 mg/dL (ref 6–20)
CO2: 16 mmol/L — ABNORMAL LOW (ref 22–32)
Calcium: 8.2 mg/dL — ABNORMAL LOW (ref 8.9–10.3)
Chloride: 101 mmol/L (ref 98–111)
Creatinine, Ser: 0.69 mg/dL (ref 0.44–1.00)
GFR, Estimated: >60 mL/min (ref >60)
Glucose, Bld: 178 mg/dL — ABNORMAL HIGH (ref 70–99)
Potassium: 4.5 mmol/L (ref 3.5–5.1)
Sodium: 134 mmol/L — ABNORMAL LOW (ref 135–145)
Total Bilirubin: 0.6 mg/dL (ref 0.3–1.2)
Total Protein: 5.7 g/dL — ABNORMAL LOW (ref 6.5–8.1)

## 2022-03-14 LAB — RAD ONC ARIA SESSION SUMMARY
Course Elapsed Days: 2
Plan Fractions Treated to Date: 3
Plan Prescribed Dose Per Fraction: 3 Gy
Plan Total Fractions Prescribed: 10
Plan Total Prescribed Dose: 30 Gy
Reference Point Dosage Given to Date: 9 Gy
Reference Point Session Dosage Given: 3 Gy
Session Number: 3

## 2022-03-14 LAB — CBC WITH DIFFERENTIAL/PLATELET
Abs Immature Granulocytes: 0.08 10*3/uL — ABNORMAL HIGH (ref 0.00–0.07)
Basophils Absolute: 0 10*3/uL (ref 0.0–0.1)
Basophils Relative: 0 %
Eosinophils Absolute: 0 10*3/uL (ref 0.0–0.5)
Eosinophils Relative: 0 %
HCT: 44.3 % (ref 36.0–46.0)
Hemoglobin: 15.4 g/dL — ABNORMAL HIGH (ref 12.0–15.0)
Immature Granulocytes: 1 %
Lymphocytes Relative: 7 %
Lymphs Abs: 0.8 10*3/uL (ref 0.7–4.0)
MCH: 36 pg — ABNORMAL HIGH (ref 26.0–34.0)
MCHC: 34.8 g/dL (ref 30.0–36.0)
MCV: 103.5 fL — ABNORMAL HIGH (ref 80.0–100.0)
Monocytes Absolute: 0.3 10*3/uL (ref 0.1–1.0)
Monocytes Relative: 3 %
Neutro Abs: 9.8 10*3/uL — ABNORMAL HIGH (ref 1.7–7.7)
Neutrophils Relative %: 89 %
Platelets: 199 10*3/uL (ref 150–400)
RBC: 4.28 MIL/uL (ref 3.87–5.11)
RDW: 11.9 % (ref 11.5–15.5)
WBC: 11 10*3/uL — ABNORMAL HIGH (ref 4.0–10.5)
nRBC: 0 % (ref 0.0–0.2)

## 2022-03-14 NOTE — Evaluation (Signed)
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[place])   Past Medical History:  Past Medical History:  Diagnosis Date   Arthritis    Cancer ([place])    Depression    Dyslipidemia    Essential hypertension 07/31/2015   Hypothyroidism 07/31/2015   doctor took her off medication   Past Surgical History:  Past Surgical History:  Procedure Laterality Date   BLADDER SUSPENSION     CRANIOTOMY Right 03/04/2021   Procedure: CRANIOTOMY TUMOR RESECTION W/ BRAIN LAB;  Surgeon: Newman Pies, MD;  Location: [facility];  Service: Neurosurgery;  Laterality: Right;   TONSILLECTOMY     TUBAL LIGATION      Assessment & Plan Clinical Impression:  Malisia Cowley is a 57 year old female with a history of right temporal glioblastoma followed by Dr. Mickeal Skinner. On office visit 2/12, she reported weakness and decreased motor function of LLE. MRI total spine LMD screening arranged. Her symptoms worsened prior to study and she presented to [facility] ED for evaluation on 03/07/2022. 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  Dr. Mickeal Skinner contacted and neurosurgery consulted. Decadron started. On Dr. Hewitt Shorts evaluation, he stated "metastatic tumor, most likely glioblastoma multiforme, to the spinal cord at T6. Not resectable with any expectation to maintain lower extremity function". Radiation oncology consult obtained by Dr. Lisbeth Renshaw. Discussed potential palliative radiation treatment to the T6 level in 10  fractions. Noticed RLE weakness on 2/19. Plan to start radiation therapy on Wednesday, 2/21, reduce decadron to 4 mg daily at discharge and resume Temodar. She agreed to palliative care consultation. 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  Patient transferred to CIR on 03/13/2022 .    Patient currently requires max with basic self-care skills secondary to muscle weakness and muscle paralysis, decreased cardiorespiratoy endurance, unbalanced muscle activation and decreased coordination, decreased attention to left, decreased attention, decreased awareness, decreased problem solving, decreased safety awareness, decreased memory, and delayed processing, and decreased sitting balance, decreased standing balance, decreased postural control, hemiplegia, and decreased balance strategies.  Prior to hospitalization, patient could complete ADLs with independent .  Patient will benefit from skilled intervention to increase independence with basic self-care skills prior to discharge home with care partner.  Anticipate patient will require 24 hour supervision and follow up home health.  OT - End of Session Activity Tolerance: Tolerates 10 - 20 min activity with multiple rests Endurance Deficit: Yes Endurance Deficit Description: generalized weakness OT Assessment Rehab Potential (ACUTE ONLY): Good OT Barriers to Discharge: Pending chemo/radiation OT Patient demonstrates impairments in the following area(s): Balance;Safety;Cognition;Perception;Endurance;Motor;Pain OT Basic ADL's Functional Problem(s): Bathing;Dressing;Toileting OT Transfers Functional Problem(s): Toilet;Tub/Shower OT Additional Impairment(s): None OT Plan OT Intensity: Minimum of 1-2 x/day, 45 to 90 minutes OT Frequency: 5 out of 7 days OT Duration/Estimated Length of  Stay: 3 weeks OT Treatment/Interventions: Balance/vestibular training;Discharge planning;Pain management;Functional electrical stimulation;Self Care/advanced ADL retraining;Therapeutic Activities;UE/LE Coordination activities;Cognitive remediation/compensation;Disease mangement/prevention;Functional mobility training;Patient/family education;Therapeutic Exercise;Community reintegration;DME/adaptive equipment instruction;Wheelchair propulsion/positioning;UE/LE  Strength taining/ROM;Psychosocial support;Neuromuscular re-education OT Self Feeding Anticipated Outcome(s): no goal set OT Basic Self-Care Anticipated Outcome(s): min A OT Toileting Anticipated Outcome(s): min A OT Bathroom Transfers Anticipated Outcome(s): min A OT Recommendation Recommendations for Other Services: Neuropsych consult Patient destination: Home Follow Up Recommendations: Home health OT Equipment Recommended: To be determined   OT Evaluation Precautions/Restrictions  Precautions Precautions: Fall Precaution Booklet Issued: No Precaution Comments: L LE hemplegia Restrictions Weight Bearing Restrictions: No General Chart Reviewed: Yes Family/Caregiver Present: Yes (daughter Caryl Pina)   Home Living/Prior [place] expects to be discharged to:: Private residence Living Arrangements: Spouse/significant other Available Help at Discharge: Family, Available 24 hours/day Type of Home: House Home Access: Stairs to enter, Ramped entrance [employer] of Steps: 5 brick steps, ~4" steps, but building a ramp Entrance Stairs-Rails: Right Home Layout: One level Bathroom Shower/Tub: Tub/shower unit Additional Comments: husband is able to help as needed per pt report  Lives With: Spouse IADL History Homemaking Responsibilities: No Prior Function Level of Independence: Independent with gait, Independent with transfers, Independent with basic ADLs  Able to Take Stairs?: Yes Driving:  No Vocation: On disability Vision Baseline Vision/History: 1 Wears glasses Ability to See in Adequate Light: 0 Adequate Patient Visual Report: No change from baseline Vision Assessment?: No apparent visual deficits Additional Comments: Pt reports floaters since initial tumor removal Perception  Perception: Impaired Inattention/Neglect: Impaired-to be further tested in functional context Praxis Praxis: Impaired Praxis Impairment Details: Motor planning Cognition Cognition Overall Cognitive Status: Impaired/Different from baseline Arousal/Alertness: Awake/alert Orientation Level: Person;Place;Situation Person: Oriented Place: Oriented Situation: Oriented Memory: Impaired Memory Impairment: Decreased short term memory Decreased Short Term Memory: Verbal complex;Functional complex Attention: Selective Selective Attention: Impaired Selective Attention Impairment: Verbal complex;Functional complex Awareness: Impaired Awareness Impairment: Emergent impairment Safety/Judgment: Impaired Comments: Pt impulsive in movements and often overestimating abilities Brief Interview for Mental Status (BIMS) Repetition of Three Words (First Attempt): 3 Temporal Orientation: Year: Correct Temporal Orientation: Month: Accurate within 5 days Temporal Orientation: Day: Correct Recall: "Sock": Yes, no cue required Recall: "Blue": Yes, no cue required Recall: "Bed": Yes, no cue required BIMS Summary Score: 15 Sensation Sensation Light Touch: Impaired Detail Light Touch Impaired Details: Absent LLE Hot/Cold: Appears Intact Proprioception: Impaired Detail Proprioception Impaired Details: Impaired LLE Additional Comments: Proprioception impaired as pt fatigued it worsened Coordination Gross Motor Movements are Fluid and Coordinated: No Fine Motor Movements are Fluid and Coordinated: Yes Coordination and Movement Description: Limited by LLE hemiplegia and grossly dyscoordinated/ataxic Motor   Motor Motor: Ataxia;Abnormal tone;Other (comment);Abnormal postural alignment and control Motor - Skilled Clinical Observations: limited by flaccid LLE  Trunk/Postural Assessment  Cervical Assessment Cervical Assessment: Within Functional Limits Thoracic Assessment Thoracic Assessment: Within Functional Limits Lumbar Assessment Lumbar Assessment: Within Functional Limits Postural Control Postural Control: Deficits on evaluation (L lateral lean)  Balance Balance Balance Assessed: Yes Static Sitting Balance Static Sitting - Balance Support: Feet supported Static Sitting - Level of Assistance: 4: Min assist Static Sitting - Comment/# of Minutes: as pt fatigued, her sitting balance worsened, frequent L lateral lean as well as forward LOB Dynamic Sitting Balance Dynamic Sitting - Level of Assistance: 4: Min Insurance risk surveyor Standing - Balance Support: Bilateral upper extremity supported Static Standing - Level of Assistance: 2: Max assist Dynamic Standing Balance Dynamic Standing - Balance Support: During functional activity;Bilateral upper extremity supported Dynamic Standing - Level of Assistance: 2: Max assist Extremity/Trunk Assessment RUE Assessment RUE Assessment: Within Functional Limits LUE Assessment LUE Assessment: Within Functional Limits  Care Tool Care Tool Self Care Eating   Eating Assist Level: Supervision/Verbal cueing    Oral Care    Oral Care Assist Level: Supervision/Verbal cueing    Bathing   Body parts bathed by patient: Left upper leg;Right arm;Left arm;Right lower leg;Chest;Abdomen;Left lower leg;Front perineal area;Face;Buttocks;Right upper leg     Assist Level: Moderate Assistance - Patient 50 - 74%    Upper Body Dressing(including orthotics)   What is the patient wearing?: Pull over shirt   Assist Level: Contact Guard/Touching assist    Lower Body Dressing (excluding footwear)   What is the patient wearing?:  Underwear/pull up;Pants Assist for lower body dressing: Maximal Assistance - Patient 25 - 49%    Putting on/Taking off footwear   What is the patient wearing?: Non-skid slipper socks Assist for footwear: Total Assistance - Patient < 25%       Care Tool Toileting Toileting activity   Assist for toileting: Maximal Assistance - Patient 25 - 49%     Care Tool Bed Mobility Roll left and right activity   Roll left and right assist level: Moderate Assistance - Patient 50 - 74%    Sit to lying activity   Sit to lying assist level: Moderate Assistance - Patient 50 - 74%    Lying to sitting on side of bed activity   Lying to sitting on side of bed assist level: the ability to move from lying on the back to sitting on the side of the bed with no back support.: Moderate Assistance - Patient 50 - 74%     Care Tool Transfers Sit to stand transfer   Sit to stand assist level: Maximal Assistance - Patient 25 - 49%    Chair/bed transfer   Chair/bed transfer assist level: Maximal Assistance - Patient 25 - 49%     Toilet transfer   Assist Level: Maximal Assistance - Patient 24 - 49%     Care Tool Cognition  Expression of Ideas and Wants Expression of Ideas and Wants: 3. Some difficulty - exhibits some difficulty with expressing needs and ideas (e.g, some words or finishing thoughts) or speech is not clear  Understanding Verbal and Non-Verbal Content Understanding Verbal and Non-Verbal Content: 3. Usually understands - understands most conversations, but misses some part/intent of message. Requires cues at times to understand   Memory/Recall Ability Memory/Recall Ability : Current season;Location of own room;Staff names and faces;That he or she is in a hospital/hospital unit   Refer to Care Plan for [place] 1 OT Short Term Goal 1 (Week 1): Pt will complete sit > stand with mod A OT Short Term Goal 2 (Week 1): Pt will maintain dynamic sitting balance with  (S) OT Short Term Goal 3 (Week 1): Pt will complete stand pivot transfer with LRAD with mod A OT Short Term Goal 4 (Week 1): Pt will maintain static standing balance during ADL with mod A with BUE support  Recommendations for other services: Neuropsych   Skilled Therapeutic Intervention ADL ADL Eating: Supervision/safety Where Assessed-Eating: Chair Grooming: Supervision/safety Where Assessed-Grooming: Sitting at sink Upper Body Bathing: Supervision/safety Where Assessed-Upper Body Bathing: Shower Lower Body Bathing: Moderate assistance Where Assessed-Lower Body Bathing: Shower Upper Body Dressing: Supervision/safety Where Assessed-Upper Body Dressing: Edge of bed Lower Body Dressing: Maximal assistance Where Assessed-Lower Body Dressing: Edge of bed Toileting: Maximal assistance Where Assessed-Toileting: Bedside Commode Toilet Transfer: Maximal assistance Toilet Transfer Method: Stand pivot Science writer: Geophysical data processor: Maximal  assistance (stedy) Social research officer, government Method: Other (comment) (stedy) Walk-In Shower Equipment: Transfer tub bench Mobility  Bed Mobility Bed Mobility: Supine to Sit;Sit to Supine Supine to Sit: Moderate Assistance - Patient 50-74% Sit to Supine: Moderate Assistance - Patient 50-74% Transfers Sit to Stand: Maximal Assistance - Patient 25-49% Stand to Sit: Maximal Assistance - Patient 25-49%   Skilled OT evaluation completed with the creation of pt centered OT POC. Pt educated on condition, ELOS, rehab expectations, and fall risk reduction strategies throughout session. Pt able to use stedy with CGA but without and using only RW she is at a max A level d/t LLE hemiplegia and poor postural control. Her sitting balance was initially good EOB but deteriorated with fatigue and she began to have a frequent L lean and LOB, requiring min A. She completed shower level Adls as described below, frequent OT intervention  for L lean. TIS chosen for pt for trunk support as she fatigued for safety. Pt was left sitting up in the TIS with all needs met  and call bell within reach. Daughter present.     Discharge Criteria: Patient will be discharged from OT if patient refuses treatment 3 consecutive times without medical reason, if treatment goals not met, if there is a change in medical status, if patient makes no progress towards goals or if patient is discharged from hospital.  The above assessment, treatment plan, treatment alternatives and goals were discussed and mutually agreed upon: by patient and by family  Curtis Sites 03/14/2022, 1:03 PM

## 2022-03-14 NOTE — Discharge Instructions (Addendum)
Inpatient Rehab Discharge Instructions  Kelly [name] Discharge date and time:  03/28/2022  Activities/Precautions/ Functional Status: Activity: no lifting, driving, or strenuous exercise until cleared by MD Diet: regular diet Wound Care: none needed Functional status:  ___ No restrictions     ___ Walk up steps independently ___ 24/7 supervision/assistance   ___ Walk up steps with assistance _x__ Intermittent supervision/assistance  ___ Bathe/dress independently ___ Walk with walker     ___ Bathe/dress with assistance ___ Walk Independently    ___ Shower independently ___ Walk with assistance    __x_ Shower with assistance _x__ No alcohol     ___ Return to work/school ________  Special Instructions: No driving, alcohol consumption or tobacco use.  Take blood pressure reading daily and record. Take this and your BP cuff to follow-up appointments.   COMMUNITY REFERRALS UPON DISCHARGE:    Home Health:   PT     OT     SNA                      Agency: [facility]  Phone: [phone]  *Please expect follow-up within 2-3 days to schedule your home visit. If you have not received follow-up, be sure to contact the branch directly.*   Medical Equipment/Items Ordered:  hospital bed, 30" transfer board, drop arm bedside commode, tub transfer bench                                                 Agency/Supplier: [facility] [phone]  Medical Equipment/Items Ordered: incontinence supplies and 73f catheters                                                 Agency/Supplier: [employer] #[phone] GENERAL COMMUNITY RESOURCES FOR PATIENT/FAMILY: A [facility]CS referral was made to your insurance. Please contact  [facility] at [phone] check the status of the referral.   My questions have been answered and I understand these instructions. I will adhere to these goals and the provided educational materials after my discharge from the  hospital.  Patient/Caregiver Signature _______________________________ Date __________  Clinician Signature _______________________________________ Date __________  Please bring this form and your medication list with you to all your follow-up doctor's appointments.

## 2022-03-14 NOTE — Progress Notes (Signed)
PROGRESS NOTE   Subjective/Complaints: Patient reports continued altered sensation in her left lower extremity.  She reports that she has not had a bowel movement in about a week.  She reports pain in her legs, however says medication is helping keep this under control.  Review of Systems  Constitutional:  Negative for chills and fever.  Respiratory:  Negative for shortness of breath.   Cardiovascular:  Negative for chest pain.  Gastrointestinal:  Positive for constipation. Negative for nausea and vomiting.  Genitourinary:  Positive for frequency. Negative for dysuria.  Musculoskeletal:  Positive for joint pain.  Neurological:  Positive for tingling, sensory change and focal weakness.      Objective:   No results found. No results for input(s): "WBC", "HGB", "HCT", "PLT" in the last 72 hours. No results for input(s): "NA", "K", "CL", "CO2", "GLUCOSE", "BUN", "CREATININE", "CALCIUM" in the last 72 hours.  Intake/Output Summary (Last 24 hours) at 03/14/2022 0824 Last data filed at 03/13/2022 1915 Gross per 24 hour  Intake --  Output 450 ml  Net -450 ml        Physical Exam: Vital Signs Blood pressure 134/84, pulse 74, temperature 98.5 F (36.9 C), resp. rate 20, height '5\' 4"'$  (1.626 m), weight 90.3 kg, SpO2 95 %.  Constitutional: No apparent distress. Appropriate appearance for age.  HENT: [place], AT Eyes: PERRLA. EOMI. Visual fields grossly intact. + nystagmus and discomfort with R gaze Cardiovascular: RRR, no murmurs/rub/gallops. Respiratory: CTAB. No RRW Abdomen:Soft, NT, ND, + BS Skin: C/D/I. No apparent lesions. MSK:      No apparent deformity.        Neurologic exam: Strength able to left bilateral upper extremity and right lower extremity to gravity, unable to lift left lower extremity to gravity, alert and awake, no dysarthria, patchy sensory loss approximately T5 and below on the left.,  Cranial nerves II  through XII grossly intact, no abnormal tone   Prior exam Strength:                RUE: 5/5 SA, 5/5 EF, 5/5 EE, 5/5 WE, 5/5 FF, 5/5 FA                 LUE: 5/5 SA, 5/5 EF, 5/5 EE, 5/5 WE, 5/5 FF, 5/5 FA                 RLE: 5/5 HF, 5/5 KE, 5/5 DF, 5/5 EHL, 5/5 PF                 LLE:  2/5 HF, 1+/5 KE, 0/5 DF, 3/5 EHL, 1/5 PF   Assessment/Plan: 1. Functional deficits which require 3+ hours per day of interdisciplinary therapy in a comprehensive inpatient rehab setting. Physiatrist is providing close team supervision and 24 hour management of active medical problems listed below. Physiatrist and rehab team continue to assess barriers to discharge/monitor patient progress toward functional and medical goals  Care Tool:  Bathing              Bathing assist       Upper Body Dressing/Undressing Upper body dressing        Upper body assist  Lower Body Dressing/Undressing Lower body dressing            Lower body assist       Toileting Toileting    Toileting assist       Transfers Chair/bed transfer  Transfers assist           Locomotion Ambulation   Ambulation assist              Walk 10 feet activity   Assist           Walk 50 feet activity   Assist           Walk 150 feet activity   Assist           Walk 10 feet on uneven surface  activity   Assist           Wheelchair     Assist               Wheelchair 50 feet with 2 turns activity    Assist            Wheelchair 150 feet activity     Assist          Blood pressure 134/84, pulse 74, temperature 98.5 F (36.9 C), resp. rate 20, height '5\' 4"'$  (1.626 m), weight 90.3 kg, SpO2 95 %.  Medical Problem List and Plan: 1. 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  Antithrombotics: -DVT/anticoagulation:  Pharmaceutical: Lovenox             -antiplatelet therapy: none   3. Pain Management:  -continue oxycodone 10 mg 5 x daily -continue Flexeril 5-10 mg q HS -continue Voltaren, Advil and Tylenol as needed -Consider adjusting oxycodone to as needed   4. Mood/Behavior/Sleep: LCSW to evaluate and provide emotional support             -antipsychotic agents: n/a             -continue Lexapro and Elavil   5. Neuropsych/cognition: This patient is capable of making decisions on her own behalf.   6. Skin/Wound Care: Routine skin care checks   7. Fluids/Electrolytes/Nutrition: Routine Is and Os and follow-up chemistries   8: Hypertension: monitor TID and prn             -continue lisinopril 10 mg daily  -Well-controlled    03/14/2022    2:48 PM 03/14/2022    5:53 AM 03/13/2022    9:27 PM  Vitals with BMI  Systolic [id number]  Diastolic 95 84 87  Pulse 87 74 99      9: Glioblastoma s/p resection, 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:             -continue Decadron 6 mg q 6 hours -continue Keppra 1500 mg BID and Vimpat 50 mg BID for seizure ppx             - XRT x10 followed by oral chemotherapy   10: Obesity, Class III             - Complicates medical decision making; nutrition onboard   11: GI  prophylaxis: Protonix BID   12: Constipation vs neurogenic bowel: Given smog enema yesterday             -Senokot q HS             -Miralax BID  -2/23 had a large bowel movement yesterday, had bowel movements the day prior also, continue to monitor  13. Urinary urgency vs. Neurogenic bladder. Continent.              - UA 2/22 negative except for small HgB  14.  Leukocytosis   -Likely due to Decadron, no signs of infection  LOS: 1 days A FACE TO FACE EVALUATION WAS PERFORMED  Kelly [name] 03/14/2022, 8:24 AM

## 2022-03-14 NOTE — Progress Notes (Signed)
Inpatient Rehabilitation  Patient information reviewed and entered into eRehab system by Grigor Lipschutz M. Meeah Totino, M.A., CCC/SLP, PPS Coordinator.  Information including medical coding, functional ability and quality indicators will be reviewed and updated through discharge.    

## 2022-03-14 NOTE — Care Management (Signed)
Inpatient [facility] Individual Statement of Services  Patient Name:  Kelly [name]  Date:  03/14/2022  Welcome to the [facility].  Our goal is to provide you with an individualized program based on your diagnosis and situation, designed to meet your specific needs.  With this comprehensive rehabilitation program, you will be expected to participate in at least 3 hours of rehabilitation therapies Monday-Friday, with modified therapy programming on the weekends.  Your rehabilitation program will include the following services:  Physical Therapy (PT), Occupational Therapy (OT), 24 hour per day rehabilitation nursing, Therapeutic Recreaction (TR), Psychology, Neuropsychology, Care Coordinator, Rehabilitation Medicine, [place], and Other  Weekly team conferences will be held on Tuesdays to discuss your progress.  Your Inpatient Rehabilitation Care Coordinator will talk with you frequently to get your input and to update you on team discussions.  Team conferences with you and your family in attendance may also be held.  Expected length of stay: 3-3.5 weeks  Overall anticipated outcome: Minimal Assistance  Depending on your progress and recovery, your program may change. Your Inpatient Rehabilitation Care Coordinator will coordinate services and will keep you informed of any changes. Your Inpatient Rehabilitation Care Coordinator's name and contact numbers are listed  below.  The following services may also be recommended but are not provided by the [facility] will be made to provide these services after discharge if needed.  Arrangements include referral to agencies that provide these services.  Your insurance has been verified to be:  [place] [employer] [place]  Your primary doctor  is:  [employer]   Pertinent information will be shared with your doctor and your insurance company.  Inpatient Rehabilitation Care Coordinator:  Cathleen Corti [phone] or [phone]  Information discussed with and copy given to patient by: Rana Snare, 03/14/2022, 3:37 PM

## 2022-03-14 NOTE — Evaluation (Signed)
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[place])   Past Medical History:  Past Medical History:  Diagnosis Date   Arthritis    Cancer ([place])    Depression    Dyslipidemia    Essential hypertension 07/31/2015   Hypothyroidism 07/31/2015   doctor took her off medication   Past Surgical History:  Past Surgical History:  Procedure Laterality Date   BLADDER SUSPENSION     CRANIOTOMY Right 03/04/2021   Procedure: CRANIOTOMY TUMOR RESECTION W/ BRAIN LAB;  Surgeon: Newman Pies, MD;  Location: [facility];  Service: Neurosurgery;  Laterality: Right;   TONSILLECTOMY     TUBAL LIGATION      Assessment & Plan Clinical Impression:Audrena Matchett is a 57 year old female with a history of right temporal glioblastoma followed by Dr. Mickeal Skinner. On office visit 2/12, she reported weakness and decreased motor function of LLE. MRI total spine LMD screening arranged. Her symptoms worsened prior to study and she presented to [facility] ED for evaluation on 03/07/2022. 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  Dr. Mickeal Skinner contacted and neurosurgery consulted. Decadron started. On Dr. Hewitt Shorts evaluation, he stated "metastatic tumor, most likely glioblastoma multiforme, to the spinal cord at T6. Not resectable with any expectation to maintain lower extremity function". Radiation oncology consult obtained by Dr. Lisbeth Renshaw. Discussed potential palliative radiation treatment to the T6 level in 10  fractions. Noticed RLE weakness on 2/19. Plan to start radiation therapy on Wednesday, 2/21, reduce decadron to 4 mg daily at discharge and resume Temodar. She agreed to palliative care consultation. 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  Patient transferred to CIR on 03/13/2022 .     Patient currently requires max with mobility secondary to muscle weakness and muscle paralysis, abnormal tone, unbalanced muscle activation, and decreased coordination, decreased motor planning, and decreased sitting balance, decreased standing balance, decreased postural control, hemiplegia, and decreased balance strategies.  Prior to hospitalization, patient was independent  with mobility and lived with Spouse in a House home.  Home access is 5 brick steps, ~4" steps, but building a rampStairs to enter, Ramped entrance.  Patient will benefit from skilled PT intervention to maximize safe functional mobility, minimize fall risk, and decrease caregiver burden for planned discharge home with 24 hour assist.  Anticipate patient will benefit from follow up [facility] at discharge.  PT - End of Session Activity Tolerance: Tolerates 10 - 20 min activity with multiple rests Endurance Deficit: Yes Endurance Deficit Description: generalized weakness PT Assessment Rehab Potential (ACUTE/IP ONLY): Fair PT Barriers to Discharge: Home environment access/layout;Pending chemo/radiation;Neurogenic Bowel & Bladder PT Patient demonstrates impairments in the following area(s): Balance;Pain;Perception;Safety;Endurance;Sensory;Motor;Skin Integrity PT Transfers Functional Problem(s): Bed Mobility;Bed to Chair;Car PT Locomotion Functional Problem(s): Wheelchair Mobility;Ambulation PT Plan PT Intensity: Minimum of 1-2 x/day ,45 to 90 minutes PT Frequency: 5 out of 7 days PT  Duration Estimated Length of Stay: 3-3.5 weeks PT Treatment/Interventions: Ambulation/gait training;Cognitive remediation/compensation;Discharge planning;DME/adaptive equipment instruction;Functional mobility training;Pain management;Psychosocial  support;Splinting/orthotics;Therapeutic Activities;UE/LE Strength taining/ROM;Visual/perceptual remediation/compensation;Wheelchair propulsion/positioning;UE/LE Coordination activities;Therapeutic Exercise;Stair training;Skin care/wound management;Patient/family education;Functional electrical stimulation;Neuromuscular re-education;Disease management/prevention;Community reintegration;Balance/vestibular training PT Transfers Anticipated Outcome(s): min A with LRAD PT Locomotion Anticipated Outcome(s): mod A therapeutic gait, supervision w/c PT Recommendation Recommendations for Other Services: Therapeutic Recreation consult Therapeutic Recreation Interventions: Stress management Follow Up Recommendations: Home health PT Patient destination: Home Equipment Recommended: To be determined Equipment Details: likely RW and custom w/c   PT Evaluation Precautions/Restrictions Precautions Precautions: Fall Precaution Comments: L LE hemplegia Restrictions Weight Bearing Restrictions: No General PT Amount of Missed Time (min): 37 Minutes PT Missed Treatment Reason: Out of hospital appointment Vital Signs   Pain Interference Pain Interference Pain Effect on Sleep: 3. Frequently Pain Interference with Therapy Activities: 1. Rarely or not at all Pain Interference with Day-to-Day Activities: 2. Occasionally Home Living/Prior Functioning Home Living Available Help at Discharge: Family;Available 24 hours/day Type of Home: House Home Access: Stairs to enter;Ramped entrance Entrance Stairs-Number of Steps: 5 brick steps, ~4" steps, but building a ramp Entrance Stairs-Rails: Right Home Layout: One level Bathroom Shower/Tub: Tub/shower unit Additional  Comments: husband is able to help as needed per pt report  Lives With: Spouse Prior Function Level of Independence: Independent with gait;Independent with transfers;Independent with basic ADLs  Able to Take Stairs?: Yes Driving: No Vocation: On disability Vision/Perception  Vision - History Ability to See in Adequate Light: 0 Adequate Perception Perception: Impaired Inattention/Neglect: Impaired-to be further tested in functional context Praxis Praxis: Impaired Praxis Impairment Details: Motor planning  Cognition Overall Cognitive Status: Impaired/Different from baseline Arousal/Alertness: Awake/alert Attention: Selective Selective Attention: Impaired Selective Attention Impairment: Verbal complex;Functional complex Memory: Impaired Memory Impairment: Decreased short term memory Decreased Short Term Memory: Verbal complex;Functional complex Awareness: Impaired Awareness Impairment: Emergent impairment Safety/Judgment: Impaired Comments: Pt impulsive in movements and often overestimating abilities Sensation Sensation Light Touch: Impaired Detail Light Touch Impaired Details: Absent LLE Hot/Cold: Appears Intact Proprioception: Impaired Detail Proprioception Impaired Details: Impaired LLE Additional Comments: Proprioception impaired as pt fatigued it worsened Coordination Gross Motor Movements are Fluid and Coordinated: No Fine Motor Movements are Fluid and Coordinated: Yes Coordination and Movement Description: Limited by LLE hemiplegia and grossly dyscoordinated/ataxic Motor  Motor Motor: Ataxia;Abnormal tone;Other (comment);Abnormal postural alignment and control Motor - Skilled Clinical Observations: limited by flaccid LLE   Trunk/Postural Assessment  Cervical Assessment Cervical Assessment: Within Functional Limits Thoracic Assessment Thoracic Assessment: Within Functional Limits Lumbar Assessment Lumbar Assessment: Within Functional Limits Postural  Control Postural Control: Deficits on evaluation (L lateral lean)  Balance Balance Balance Assessed: Yes Static Sitting Balance Static Sitting - Balance Support: Feet supported Static Sitting - Level of Assistance: 4: Min assist Static Sitting - Comment/# of Minutes: as pt fatigued, her sitting balance worsened, frequent L lateral lean as well as forward LOB Dynamic Sitting Balance Dynamic Sitting - Level of Assistance: 4: Min Insurance risk surveyor Standing - Balance Support: Bilateral upper extremity supported Static Standing - Level of Assistance: 2: Max assist Dynamic Standing Balance Dynamic Standing - Balance Support: During functional activity;Bilateral upper extremity supported Dynamic Standing - Level of Assistance: 2: Max assist Extremity Assessment  RUE Assessment RUE Assessment: Within Functional Limits LUE Assessment LUE Assessment: Within Functional Limits RLE Assessment RLE Assessment: Within Functional Limits General Strength Comments: Grossly 4/5 LLE Assessment LLE Assessment: Exceptions to [facility] General Strength Comments: knee and hip 0/5, ankle DF 1/5  Care Tool Care Tool Bed Mobility Roll left and right activity   Roll left and right assist level: Moderate Assistance -  Patient 50 - 74%    Sit to lying activity   Sit to lying assist level: Moderate Assistance - Patient 50 - 74%    Lying to sitting on side of bed activity   Lying to sitting on side of bed assist level: the ability to move from lying on the back to sitting on the side of the bed with no back support.: Moderate Assistance - Patient 50 - 74%     Care Tool Transfers Sit to stand transfer   Sit to stand assist level: Maximal Assistance - Patient 25 - 49%    Chair/bed transfer   Chair/bed transfer assist level: Maximal Assistance - Patient 25 - 49%     Toilet transfer   Assist Level: Maximal Assistance - Patient 24 - 49%    Car transfer Car transfer activity did not occur:  Safety/medical concerns        Care Tool Locomotion Ambulation Ambulation activity did not occur: Safety/medical concerns        Walk 10 feet activity Walk 10 feet activity did not occur: Safety/medical concerns       Walk 50 feet with 2 turns activity Walk 50 feet with 2 turns activity did not occur: Safety/medical concerns      Walk 150 feet activity Walk 150 feet activity did not occur: Safety/medical concerns      Walk 10 feet on uneven surfaces activity Walk 10 feet on uneven surfaces activity did not occur: Safety/medical concerns      Stairs Stair activity did not occur: Safety/medical concerns        Walk up/down 1 step activity Walk up/down 1 step or curb (drop down) activity did not occur: Safety/medical concerns      Walk up/down 4 steps activity Walk up/down 4 steps activity did not occur: Safety/medical concerns      Walk up/down 12 steps activity Walk up/down 12 steps activity did not occur: Safety/medical concerns      Pick up small objects from floor Pick up small object from the floor (from standing position) activity did not occur: Safety/medical concerns      Wheelchair Is the patient using a wheelchair?: Yes Type of Wheelchair: Manual (TIS at this time for safety)   Wheelchair assist level: Dependent - Patient 0%    Wheel 50 feet with 2 turns activity   Assist Level: Dependent - Patient 0%  Wheel 150 feet activity   Assist Level: Dependent - Patient 0%    Refer to Care Plan for Long Term Goals  SHORT TERM GOAL WEEK 1 PT Short Term Goal 1 (Week 1): Pt will perform STS with mod A PT Short Term Goal 2 (Week 1): Pt will perform SPT with LRAD and max A consistently PT Short Term Goal 3 (Week 1): Pt will perform bed mobility with CGA  Recommendations for other services: None   Skilled Therapeutic Intervention  Evaluation completed (see details above) with patient education regarding purpose of PT evaluation, PT POC and goals, therapy schedule,  weekly team meetings, and other CIR information including safety plan and fall risk safety. Pt's daughter present throughout.  Limited eval as pt requested to use bathroom at start of session. Pt performed the below functional mobility tasks with the specified levels of skilled cuing and assistance.  Utilized stedy transfer for safety. Extended time required for continent B+B void. Tot A hygiene. Documented in flow sheet. Pt performed Sit to stand from bed to RW with max A and heavy UE reliance.  Pt returned to bed with mod A and pulled up with supervision/cueing. Pt was left with all needs in reach and alarm active.   Mobility Bed Mobility Bed Mobility: Supine to Sit;Sit to Supine Supine to Sit: Moderate Assistance - Patient 50-74% Sit to Supine: Moderate Assistance - Patient 50-74% Transfers Transfers: Sit to Stand;Stand to Sit;Stand Pivot Transfers Sit to Stand: Maximal Assistance - Patient 25-49% Stand to Sit: Maximal Assistance - Patient 25-49% Stand Pivot Transfers: Maximal Assistance - Patient 25 - 49% Stand Pivot Transfer Details: Verbal cues for technique;Verbal cues for safe use of DME/AE;Verbal cues for gait pattern;Verbal cues for sequencing;Manual facilitation for weight shifting Transfer (Assistive device): Rolling walker Locomotion  Gait Ambulation: No Gait Gait: No Stairs / Additional Locomotion Stairs: No Wheelchair Mobility Wheelchair Mobility: No   Discharge Criteria: Patient will be discharged from PT if patient refuses treatment 3 consecutive times without medical reason, if treatment goals not met, if there is a change in medical status, if patient makes no progress towards goals or if patient is discharged from hospital.  The above assessment, treatment plan, treatment alternatives and goals were discussed and mutually agreed upon: by patient  Mickel Fuchs 03/14/2022, 2:00 PM

## 2022-03-14 NOTE — Progress Notes (Signed)
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[place])  Past Medical History:  Past Medical History:  Diagnosis Date   Arthritis    Cancer ([place])    Depression    Dyslipidemia    Essential hypertension 07/31/2015   Hypothyroidism 07/31/2015   doctor took her off medication   Past Surgical History:  Past Surgical History:  Procedure Laterality Date   BLADDER SUSPENSION     CRANIOTOMY Right 03/04/2021   Procedure: CRANIOTOMY TUMOR RESECTION W/ BRAIN LAB;  Surgeon: Newman Pies, MD;  Location: [facility];  Service: Neurosurgery;  Laterality: Right;   TONSILLECTOMY     TUBAL LIGATION     Social History:  reports that she has been smoking cigarettes. She has a 28.50 pack-year smoking history. She has never used smokeless tobacco. She reports that she does not drink alcohol and does not use drugs.  Family / Support Systems Marital Status: Married How Long?: 58 years Patient Roles: Spouse, Parent Spouse/Significant Other: Time (husband) [phone] Children: Blended Family- pt has two children- Caryl Pina (living), son deceased (anniversary was 58, 2024); Husband has a son named Legrand Como- lives in [place] with 9 yr old grandson. Other Supports: None reported Anticipated Caregiver: Husband Tim and PRN support from daughter Ability/Limitations of Caregiver: None reported Caregiver Availability: 24/7 Family Dynamics: Pt lives with her husband.  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 Education: some college Health Literacy - How often do you need to have someone help you when you read  instructions, pamphlets, or other written material from your doctor or pharmacy?: Never Writes: Yes Employment Status: Disabled Date Retired/Disabled/Unemployed: 11/18/21. SSDI first check will be on 2/28. Legal History/Current Legal Issues: Denies Guardian/Conservator: Pt husband Tim and dtr Ashe[place] Durene Romans [phone]). Advanced Care Directive on file.   Abuse/Neglect Abuse/Neglect Assessment Can Be Completed: Yes Physical Abuse: Denies Verbal Abuse: Denies Sexual Abuse: Denies Exploitation of patient/patient's resources: Denies Self-Neglect: Denies  Patient response to: Social Isolation - How often do you feel lonely or isolated from those around you?: Never  Emotional Status Pt's affect, behavior and adjustment status: Pt in good spirits at time of visit. Recent Psychosocial Issues: Pt reports it has been challenging this year due to managing the loss of her son (anniversary 03-27-22; deceased last year). Psychiatric History: Admits to Lexapro for anxiety since the onset of changes beginning last year. Substance Abuse History: Pt reports she quit smoking cigarettes; denies any etoh or rec drug use.  Patient / Family Perceptions, Expectations & Goals Pt/Family understanding of illness & functional limitations: Pt and family have a general understanding of pt care needs Premorbid pt/family roles/activities: Required assistance prior to admission. Anticipated changes in roles/activities/participation: Continued assistance with ADLs/IADLs Pt/family expectations/goals: Pt goal is to work on being able to get into her home.  Community Resources [employer]: None Premorbid Home Care/DME Agencies: None Transportation available at discharge: TBD Is the patient able to respond to transportation needs?: Yes In the past 12 months, has lack of transportation kept you from medical appointments or from getting medications?: No In the past 12 months, has lack of transportation kept you from  meetings, work, or from getting things needed for  daily living?: No Resource referrals recommended: Neuropsychology  Discharge Planning Living Arrangements: Spouse/significant other, Children Support Systems: Spouse/significant other, Children Type of Residence: Private residence Insurance Resources: [employer] (specify county) ([place]) Financial Resources: [employer] Financial Screen Referred: No Living Expenses: Medical laboratory scientific officer Management: Spouse Does the patient have any problems obtaining your medications?: No Home Management: Pt and husband help manage home care needs. Pt reports she does what she is able too around the home. States her husband does majority of yard work. Patient/Family Preliminary Plans: TBD Care Coordinator Barriers to Discharge: Insurance for SNF coverage Care Coordinator Anticipated Follow Up Needs: HH/OP Expected length of stay: 3-3.5 weeks  Clinical Impression SW met with pt and pt dtr Caryl Pina in room to introduce self, explain role, and discussed discharge process. 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 HCPOA- husband Octavia Bruckner and dtr [place]. No DME at tome. Family intends to have ramp built. Pt reports she will need a shower chair, w/c, 3in1 BSC, and hospital bed. SW shared will provide updates on DME needs once confirmed by therapy team. She is aware SW will follow-up with her husband.   [id number] spoke with pt husband Tim to introduce self, explain role, discuss discharge process, and inform on ELOS. SW shared will follow-up with updates after team conference.   SW left statement of service in room.   Aleister Lady A Hagan Maltz 03/14/2022, 4:07 PM

## 2022-03-14 NOTE — Discharge Summary (Signed)
Physician Discharge Summary  Patient ID: Kelly [name] MRN: [id number] DOB/AGE: 58-01-1965 58 y.o.  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 Incomplete paraplegia with worsening right leg weakness.   EXAM: CT THORACIC AND LUMBAR SPINE WITHOUT CONTRAST   TECHNIQUE: Multidetector CT imaging of the thoracic and lumbar spine was performed without contrast. Multiplanar CT image reconstructions were also generated.   RADIATION DOSE REDUCTION: This exam was performed according to the departmental dose-optimization program which includes automated exposure control, adjustment of the mA and/or kV according to patient size and/or use of iterative reconstruction technique.   COMPARISON:  MR total spine dated March 07, 2022.   FINDINGS: CT THORACIC SPINE FINDINGS   Alignment: Normal.   Vertebrae: No acute fracture or focal pathologic process.   Paraspinal and other soft tissues: Scattered ground-glass densities and peribronchovascular ground-glass consolidation in both lungs with smooth interlobular septal thickening. Unchanged very large hiatal hernia containing stomach and colon.   Disc levels: Focal round hyperdensity in the spinal canal at T6 measuring 1.3 x 1.1 cm, corresponding to the metastasis seen better on recent MRI. No significant disc bulge or herniation.   CT LUMBAR SPINE FINDINGS   Segmentation: 5 lumbar type vertebrae.   Alignment: Normal.   Vertebrae: No acute fracture or focal pathologic process.   Paraspinal  and other soft tissues: Aortoiliac atherosclerotic vascular disease. Sigmoid colonic diverticulosis.   Disc levels: Mild disc bulging from L1-L2 through L4-L5. Lower lumbar facet arthropathy, moderate on the right at L5-S1. No spinal canal or neuroforaminal stenosis at any level.   IMPRESSION: 1. Focal round hyperdensity in the spinal canal at T6 measuring 1.3 x 1.1 cm, corresponding to the metastasis seen better on recent MRI. 2. No acute osseous abnormality or significant degenerative changes in the thoracic or lumbar spine. 3. Scattered ground-glass densities and peribronchovascular ground-glass consolidation in both lungs, concerning for multifocal pneumonia. 4. Unchanged very large hiatal hernia containing stomach and colon. 5.  Aortic Atherosclerosis (ICD10-I70.0).     Electronically Signed   By: Titus Dubin M.D.   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 Incomplete paraplegia with worsening right leg weakness.   EXAM: CT THORACIC AND LUMBAR SPINE WITHOUT CONTRAST   TECHNIQUE: Multidetector CT imaging of the thoracic and lumbar spine was performed without contrast. Multiplanar CT image reconstructions were also generated.   RADIATION DOSE REDUCTION: This exam was performed according to the departmental dose-optimization program which includes automated exposure control, adjustment of the mA and/or kV according to patient size and/or use of iterative reconstruction technique.   COMPARISON:  MR total spine dated March 07, 2022.   FINDINGS: CT THORACIC SPINE FINDINGS   Alignment: Normal.   Vertebrae: No acute fracture or focal pathologic process.   Paraspinal and other soft tissues: Scattered ground-glass densities and peribronchovascular ground-glass consolidation in both lungs with smooth interlobular septal thickening. Unchanged very large hiatal hernia containing stomach and  colon.   Disc levels: Focal round hyperdensity in the spinal canal at T6 measuring 1.3 x 1.1 cm, corresponding to the metastasis seen better  on recent MRI. No significant disc bulge or herniation.   CT LUMBAR SPINE FINDINGS   Segmentation: 5 lumbar type vertebrae.   Alignment: Normal.   Vertebrae: No acute fracture or focal pathologic process.   Paraspinal and other soft tissues: Aortoiliac atherosclerotic vascular disease. Sigmoid colonic diverticulosis.   Disc levels: Mild disc bulging from L1-L2 through L4-L5. Lower lumbar facet arthropathy, moderate on the right at L5-S1. No spinal canal or neuroforaminal stenosis at any level.   IMPRESSION: 1. Focal round hyperdensity in the spinal canal at T6 measuring 1.3 x 1.1 cm, corresponding to the metastasis seen better on recent MRI. 2. No acute osseous abnormality or significant degenerative changes in the thoracic or lumbar spine. 3. Scattered ground-glass densities and peribronchovascular ground-glass consolidation in both lungs, concerning for multifocal pneumonia. 4. Unchanged very large hiatal hernia containing stomach and colon. 5.  Aortic Atherosclerosis (ICD10-I70.0).     Electronically Signed   By: Titus Dubin M.D.   On: 03/25/2022 18:15    Narrative & Impression  CLINICAL DATA:  Slurred speech.  GBM.   EXAM: CT HEAD WITHOUT CONTRAST   CT CERVICAL SPINE WITHOUT CONTRAST   TECHNIQUE: Multidetector CT imaging of the head and cervical spine was performed following the standard protocol without intravenous contrast. Multiplanar CT image reconstructions of the cervical spine were also generated.   RADIATION DOSE REDUCTION: This exam was performed according to the departmental dose-optimization program which includes automated exposure control, adjustment of the mA and/or kV according to patient size and/or use of iterative reconstruction technique.   COMPARISON:  Head CT dated 03/01/2021.    FINDINGS: CT HEAD FINDINGS   Brain: Postoperative changes of the right temporal lobe with encephalomalacia. There is mild periventricular and deep white matter chronic microvascular ischemic changes. There is no acute intracranial hemorrhage. No mass effect or midline shift. No extra-axial fluid collection.   Vascular: No hyperdense vessel or unexpected calcification.   Skull: Right temporal craniotomy.  No acute calvarial pathology.   Sinuses/Orbits: No acute finding.   Other: None   CT CERVICAL SPINE FINDINGS   Alignment: No acute subluxation. There is straightening of normal cervical lordosis which may be positional or due to muscle spasm.   Skull base and vertebrae: No acute fracture.   Soft tissues and spinal canal: No prevertebral fluid or swelling. No visible canal hematoma.   Disc levels:  No acute findings.  Degenerative changes.   Upper chest: Patchy bilateral airspace densities concerning for pneumonia, possibly atypical in etiology. Clinical correlation is recommended.   Other: Bilateral carotid bulb calcified plaques.   IMPRESSION: 1. No acute intracranial pathology. 2. Postoperative changes of the right temporal lobe with encephalomalacia. 3. No acute/traumatic cervical spine pathology. 4. Patchy bilateral airspace densities concerning for pneumonia, possibly atypical in etiology.     Electronically Signed   By: Anner Crete M.D.   On: 03/25/2022 18:03      Narrative & Impression  CLINICAL DATA:  Slurred speech.  GBM.   EXAM: CT HEAD WITHOUT CONTRAST   CT CERVICAL SPINE WITHOUT CONTRAST   TECHNIQUE: Multidetector CT imaging of the head and cervical spine was performed following the standard protocol without intravenous contrast. Multiplanar CT image reconstructions of the cervical spine were also generated.   RADIATION DOSE REDUCTION: This exam was performed according to the departmental dose-optimization program which includes  automated exposure control, adjustment of the mA and/or kV according to patient size and/or use of iterative reconstruction technique.   COMPARISON:  Head  CT dated 03/01/2021.   FINDINGS: CT HEAD FINDINGS   Brain: Postoperative changes of the right temporal lobe with encephalomalacia. There is mild periventricular and deep white matter chronic microvascular ischemic changes. There is no acute intracranial hemorrhage. No mass effect or midline shift. No extra-axial fluid collection.   Vascular: No hyperdense vessel or unexpected calcification.   Skull: Right temporal craniotomy.  No acute calvarial pathology.   Sinuses/Orbits: No acute finding.   Other: None   CT CERVICAL SPINE FINDINGS   Alignment: No acute subluxation. There is straightening of normal cervical lordosis which may be positional or due to muscle spasm.   Skull base and vertebrae: No acute fracture.   Soft tissues and spinal canal: No prevertebral fluid or swelling. No visible canal hematoma.   Disc levels:  No acute findings.  Degenerative changes.   Upper chest: Patchy bilateral airspace densities concerning for pneumonia, possibly atypical in etiology. Clinical correlation is recommended.   Other: Bilateral carotid bulb calcified plaques.   IMPRESSION: 1. No acute intracranial pathology. 2. Postoperative changes of the right temporal lobe with encephalomalacia. 3. No acute/traumatic cervical spine pathology. 4. Patchy bilateral airspace densities concerning for pneumonia, possibly atypical in etiology.     Electronically Signed   By: Anner Crete M.D.   On: 03/25/2022 18:03    Narrative & Impression  CLINICAL DATA:  Hypoxia   EXAM: PORTABLE CHEST 1 VIEW   COMPARISON:  CT CAP 01/15/21, CXR 01/16/21   FINDINGS: No pleural effusion. No pneumothorax. There is a massive hiatal hernia. Compared to prior exam there new patchy airspace opacity in the left mid and lower lung. No  radiographically apparent displaced rib fractures. Cardiac contours are poorly assessed. Degenerative changes of the bilateral AC joints.   IMPRESSION: New patchy airspace opacity in the left mid and lower lung. In the setting of the large hiatal hernia, these are suspicious for aspiration and/or infection.     Electronically Signed   By: Marin Roberts M.D.   On: 03/24/2022 14:16    Narrative & Impression  CLINICAL DATA:  Left shoulder pain.   EXAM: LEFT SHOULDER - 2+ VIEW   COMPARISON:  Chest radiograph 01/16/2021   FINDINGS: Multiple longitudinal linear lucencies within the inferior aspect of the glenoid and adjacent scapular body appear to represent normal trabecular markings, and an acute fracture is felt less likely. Mild inferior glenoid and humeral head-neck junction degenerative spurring. Mild acromioclavicular joint space narrowing and peripheral osteophytosis.   A calcified likely benign vascular phlebolith overlies the superior left lung, similar to 01/16/2021 radiograph.   IMPRESSION: 1. No definite acute fracture 2. Mild acromioclavicular and glenohumeral osteoarthritis.     Electronically Signed   By: Yvonne Kendall M.D.   On: 03/24/2022 14:18    Labs:  Basic Metabolic Panel:    Latest Ref Rng & Units 03/25/2022   10:32 AM 03/24/2022    6:22 AM 03/19/2022    6:50 AM  BMP  Glucose 70 - 99 mg/dL 99  126  100   BUN 6 - 20 mg/dL '18  20  17   '$ Creatinine 0.44 - 1.00 mg/dL 0.74  0.71  0.63   Sodium 135 - 145 mmol/L 134  132  134   Potassium 3.5 - 5.1 mmol/L 4.1  4.3  5.0   Chloride 98 - 111 mmol/L 102  102  96   CO2 22 - 32 mmol/L '24  23  27   '$ Calcium 8.9 - 10.3 mg/dL 8.3  8.1  8.8           Latest Ref Rng & Units 03/25/2022   10:32 AM 03/24/2022    6:22 AM 03/17/2022    7:17 AM  CBC  WBC 4.0 - 10.5 K/uL 9.9  9.3  8.2   Hemoglobin 12.0 - 15.0 g/dL 13.4  16.5  14.5   Hematocrit 36.0 - 46.0 % 38.3  47.7  43.0   Platelets 150 - 400 K/uL 160  217  191        Brief HPI:   Kelly [name] is a 57 y.o. female with a history of right temporal glioblastoma followed by Dr. Mickeal Skinner. On office visit 2/12, she reported weakness and decreased motor function of LLE. MRI total spine LMD screening arranged. Her symptoms worsened prior to study and she presented to [facility] ED for evaluation on 03/07/2022. 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  Dr. Mickeal Skinner contacted and neurosurgery consulted. Decadron started. On Dr. Hewitt Shorts evaluation, he stated "metastatic tumor, most likely glioblastoma multiforme, to the spinal cord at T6. Not resectable with any expectation to maintain lower extremity function". Radiation oncology consult obtained by Dr. Lisbeth Renshaw. Discussed potential palliative radiation treatment to the T6 level in 10 fractions. Noticed RLE weakness on 2/19. Plan to start radiation therapy on Wednesday, 2/21, reduce decadron to 4 mg daily at discharge and resume Temodar. Palliative care consulted to discuss [place] and patient elected on full scope of care. Therapy has been working with patient who continued to be limited by LLE weakness, blurry vision intermittently and decreased insight into deficits. CIR recommended due to functional decline.     Hospital Course: Kelly [name] was admitted to rehab 03/13/2022 for inpatient therapies to consist of PT, ST and OT at least three hours five days a week. Past admission physiatrist, therapy team and rehab RN have worked together to provide customized collaborative inpatient rehab. Started melatonin for insomnia 2/24. Serum sodium 132 on 2/26 and started on salt tablets. Radiation therapy started 2/27. Serum sodium mildly improved to 134 2/28. Reported improvement in left foot mobility. Gabapentin 300 mg q HS and Voltaren gel helpful for pain control. Gabapentin increased to TID on 3/01 for bilateral foot pain. Reported feeling GI reflux symptoms on 3/04 as well as worsening back pain. Hypotension and  changed in speech pattern. Ivf resuscitation and imaging including C-spine and left shoulder. Hospitalists consulted. Chest x-ray obtained c/w left sided pneumonia. Ceftriaxone and azithromycin IV given for 5 days with good results. Cardiac enzymes negative. Neuropsychology consult. Flomax started for urinary retention. TSH normal. CT of C, T and L spine performed 3/05 due to LLE weakness. Dr. Mickeal Skinner rec: Decadron 4 mg BID to continue at discharge. Discussed Lovenox at discharge. Her BP normalized after treatment for pneumonia and was slightly high. Restarted lisinopril 5 mg daily. Authorized increase Elavil dose to 100 mg at bedtime. Voided without PVR. Home with in and out cath supplies and instructions/teaching.   Blood pressures were monitored on TID basis and lisinopril 10 mg daily continued. Soft BP noted on 3/04 and monitored for symptoms. See above narrative.  Rehab course: During patient's stay in rehab weekly team conferences were held to monitor patient's progress, set goals and discuss barriers to discharge. At admission, patient required max assist with basic self-care skills and max assist with transfers.  She exhibited poor insight into deficits with impulsivity with mild cognitive deficits and SLUMS score 22/30.  She  has had improvement in activity tolerance,  balance, postural control as well as ability to compensate for deficits.   Current medications:   amitriptyline  100 mg Oral QHS   atorvastatin  10 mg Oral Daily   calcium carbonate  400 mg of elemental calcium Oral TID   cyclobenzaprine  5-10 mg Oral QHS   dexamethasone  4 mg Oral Q12H   diclofenac Sodium  2 g Topical QID   enoxaparin (LOVENOX) injection  40 mg Subcutaneous Q24H   enoxaparin   Does not apply Once   escitalopram  10 mg Oral QHS   gabapentin  300 mg Oral TID   lacosamide  100 mg Oral BID   levETIRAcetam  1,500 mg Oral BID   melatonin  10 mg Oral QHS   pantoprazole  40 mg Oral BID   senna-docusate  2 tablet  Oral QHS   sodium chloride  1 g Oral BID WC   tamsulosin  0.4 mg Oral QPC supper    Disposition: Home with husband  Diet: regular  Special Instructions: No driving, alcohol consumption or tobacco use.  30-35 minutes were spent on discharge planning and discharge summary.  Discharge Instructions     Ambulatory referral to Physical Medicine Rehab   Complete by: As directed    Hospital follow-up   Discharge patient   Complete by: As directed    Discharge disposition: 01-Home or Self Care   Discharge patient date: 03/28/2022      Allergies as of 03/28/2022       Reactions   Ranitidine Anaphylaxis        Medication List     STOP taking these medications    polyethylene glycol 17 g packet Commonly known as: MIRALAX / GLYCOLAX Replaced by: polyethylene glycol powder 17 GM/SCOOP powder       TAKE these medications    acetaminophen 325 MG tablet Commonly known as: TYLENOL Take 1-2 tablets (325-650 mg total) by mouth every 4 (four) hours as needed for mild pain. What changed:  how much to take reasons to take this   amitriptyline 100 MG tablet Commonly known as: ELAVIL Take 2 tablets (200 mg total) by mouth at bedtime. What changed:  how much to take how to take this when to take this additional instructions   atorvastatin 10 MG tablet Commonly known as: LIPITOR Take 10 mg by mouth at bedtime.   Calcium Antacid 500 MG chewable tablet Generic drug: calcium carbonate Chew 2 tablets (400 mg of elemental calcium total) by mouth 3 (three) times daily.   cyclobenzaprine 5 MG tablet Commonly known as: FLEXERIL Take 1-2 tablets (5-10 mg total) by mouth at bedtime. What changed:  medication strength when to take this reasons to take this   dexamethasone 4 MG tablet Commonly known as: DECADRON Take 1 tablet (4 mg total) by mouth every 12 (twelve) hours. What changed:  medication strength how much to take when to take this   diclofenac Sodium 1 %  Gel Commonly known as: VOLTAREN Apply 2 g topically 4 (four) times daily.   enoxaparin 40 MG/0.4ML injection Commonly known as: LOVENOX Inject 0.4 mLs (40 mg total) into the skin daily for 28 days. Start taking on: March 29, 2022   enoxaparin Kit Commonly known as: LOVENOX 1 kit by Does not apply route once for 1 dose.   escitalopram 10 MG tablet Commonly known as: LEXAPRO Take 10 mg by mouth at bedtime.   gabapentin 300 MG capsule Commonly known as: NEURONTIN Take 1 capsule (300 mg total)  by mouth 3 (three) times daily.   ibuprofen 400 MG tablet Commonly known as: ADVIL Take 1 tablet (400 mg total) by mouth every 6 (six) hours as needed for mild pain or headache.   Lacosamide 100 MG Tabs Take 1 tablet (100 mg total) by mouth 2 (two) times daily.   levETIRAcetam 750 MG tablet Commonly known as: KEPPRA TAKE 2 TABLETS (1,500 MG TOTAL) BY MOUTH 2 (TWO) TIMES DAILY.   lisinopril 5 MG tablet Commonly known as: ZESTRIL Take 1 tablet (5 mg total) by mouth at bedtime. What changed:  medication strength how much to take   melatonin 5 MG Tabs Take 2 tablets (10 mg total) by mouth at bedtime.   oxyCODONE 5 MG immediate release tablet Commonly known as: Oxy IR/ROXICODONE Take 1-2 tablets (5-10 mg total) by mouth every 4 (four) hours as needed for breakthrough pain. What changed:  medication strength how much to take reasons to take this   pantoprazole 40 MG tablet Commonly known as: PROTONIX Take 1 tablet (40 mg total) by mouth 2 (two) times daily. What changed: when to take this   polyethylene glycol powder 17 GM/SCOOP powder Commonly known as: GLYCOLAX/MIRALAX Take 1 capful (17 g) by mouth daily as needed for mild constipation. Replaces: polyethylene glycol 17 g packet   prochlorperazine 10 MG tablet Commonly known as: COMPAZINE Take 1 tablet (10 mg total) by mouth every 6 (six) hours as needed for nausea or vomiting.   Senexon-S 8.6-50 MG tablet Generic drug:  senna-docusate Take 2 tablets by mouth at bedtime.   sodium chloride 1 g tablet Take 1 tablet (1 g total) by mouth 2 (two) times daily with a meal.   tamsulosin 0.4 MG Caps capsule Commonly known as: FLOMAX Take 1 capsule (0.4 mg total) by mouth daily after supper.   temozolomide 100 MG capsule Commonly known as: TEMODAR Take 4 capsules (400 mg total) by mouth daily. May take on an empty stomach to decrease nausea & vomiting.        Follow-up Information     [facility] Follow up.   Specialty: Internal Medicine Why: Call in 1-2 days to make arrangements for hospital follow-up. Contact information: [place] [address] [phone]         Ventura Sellers, MD. Go to.   Specialties: Psychiatry, Neurology, Oncology Contact information: [place] [address] [id number]         Courtney Heys, MD Follow up.   Specialty: Physical Medicine and Rehabilitation Why: office will call you to arrange your appt (sent) Contact information: [address] [place] [address] [phone]                 Signed: Barbie Banner 03/28/2022, 4:35 PM

## 2022-03-14 NOTE — Progress Notes (Signed)
Patient ID: Kelly [name], female   DOB: [birth year], 57 y.o.   MRN: [id number]  Patient current radiation schedule p/u times:  Friday     2/23   1:25pm                Mon        2/26    3 pm Tuesday  2/27   3 pm Wed        2/28   1 pm Thursday  2/29  1 pm Friday      3/1    1pm  Mon         3/4    1pm   Tuesday  3/5    1pm  (Last treatment)  Loralee Pacas, MSW, [place] Office: [phone] Cell: [phone] Fax: [phone]

## 2022-03-14 NOTE — Progress Notes (Signed)
Physical Therapy Session Note  Patient Details  Name: Kelly [name] MRN: [id number] Date of Birth: [birth year]  Today's Date: 03/14/2022 PT Individual Time: 1300-1338 PT Individual Time Calculation (min): 38 min   Short Term Goals: Week 1:  PT Short Term Goal 1 (Week 1): Pt will perform STS with mod A PT Short Term Goal 2 (Week 1): Pt will perform SPT with LRAD and max A consistently PT Short Term Goal 3 (Week 1): Pt will perform bed mobility with CGA  Skilled Therapeutic Interventions/Progress Updates:  Pt recd in DeWitt chair with no c/o pain. Pt scheduled for radiation transport during this session, so focused on returning pt to bed and bed level exercise. Dependent stedy transfer to bed. Pt performed Sit to stand from EOB to stedy x 15 for global strength and benefit of BLE weight bearing. Cues for increased LLE activation with some glute activation noted. Pt returned to bed using hemi technique with mod A to complete BLE management. Pt performed glute bridges 4 x 5 with tapping facilitation and assist to maintain LLE in hooklying. Trialled pt utilizing gait belt for LLE management, but as LLE is completely flaccid, therapist concerned for joint integrity, pt's knee tends toward hyperextension when lifted with belt. Pt will benefit from leg loops. Pt able to achieve circle sit and long sit and maintain balance with CGA, assist for positioning LLE. Discussed using these positions for self care in the future. During rest breaks, pt with questions about prognosis and when she might see return. Discussed that she may not see much/any return in LLE, but plan to work on every muscle she has. Pt requested to rest in preparation for radiation transport, which is expected any minute, Pt remained in bed and was left with all needs in reach and alarm active.   Therapy Documentation Precautions:  Precautions Precautions: Fall Precaution Booklet Issued: No Precaution Comments: L LE  hemplegia Restrictions Weight Bearing Restrictions: No General: PT Amount of Missed Time (min): 37 Minutes PT Missed Treatment Reason: Out of hospital appointment     Therapy/Group: Individual Therapy  Mickel Fuchs 03/14/2022, 1:45 PM

## 2022-03-14 NOTE — Plan of Care (Signed)
  Problem: Consults Goal: RH SPINAL CORD INJURY PATIENT EDUCATION Description:  See Patient Education module for education specifics.  Outcome: Progressing   Problem: SCI BOWEL ELIMINATION Goal: RH STG MANAGE BOWEL WITH ASSISTANCE Description: STG Manage Bowel with min Assistance. Outcome: Progressing Goal: RH STG SCI MANAGE BOWEL WITH MEDICATION WITH ASSISTANCE Description: STG SCI Manage bowel with medication with mod I  assistance. Outcome: Progressing   Problem: SCI BLADDER ELIMINATION Goal: RH STG MANAGE BLADDER WITH ASSISTANCE Description: STG Manage Bladder With min Assistance Outcome: Progressing   Problem: RH SAFETY Goal: RH STG ADHERE TO SAFETY PRECAUTIONS W/ASSISTANCE/DEVICE Description: STG Adhere to Safety Precautions With cues Assistance/Device. Outcome: Progressing   Problem: RH PAIN MANAGEMENT Goal: RH STG PAIN MANAGED AT OR BELOW PT'S PAIN GOAL Description: < 4 with prns Outcome: Progressing   Problem: RH KNOWLEDGE DEFICIT SCI Goal: RH STG INCREASE KNOWLEDGE OF SELF CARE AFTER SCI Description: Patient and spouse will be able to manage care at discharge using educational resources independently Outcome: Progressing

## 2022-03-15 DIAGNOSIS — S24109S Unspecified injury at unspecified level of thoracic spinal cord, sequela: Secondary | ICD-10-CM | POA: Diagnosis not present

## 2022-03-15 MED ORDER — MELATONIN 3 MG PO TABS
6.0000 mg | ORAL_TABLET | Freq: Every day | ORAL | Status: DC
  Administered 2022-03-15 – 2022-03-23 (×9): 6 mg via ORAL
  Filled 2022-03-15 (×9): qty 2

## 2022-03-15 NOTE — Progress Notes (Signed)
Physical Therapy Session Note  Patient Details  Name: Kelly [name] MRN: [id number] Date of Birth: [birth year]  Today's Date: 03/15/2022 PT Individual Time: 1045-1200 PT Individual Time Calculation (min): 75 min   Short Term Goals: Week 1:  PT Short Term Goal 1 (Week 1): Pt will perform STS with mod A PT Short Term Goal 2 (Week 1): Pt will perform SPT with LRAD and max A consistently PT Short Term Goal 3 (Week 1): Pt will perform bed mobility with CGA  Skilled Therapeutic Interventions/Progress Updates:    Chart reviewed and pt agreeable to therapy. Pt received seated in TIS with c/o soreness in R groin that was not quantified. Session focused on functional transfers and strength training to promote recovery and safe home access. Pt initiated session with block practice of sit to stand using CGA + STEDY with focus on R knee control. Pt then completed pre-gait activity of weight-shifting. Pt requested demonstration of STDY safety to husband in case of future need, so PT demonstrated use of STEDY to husband during transfer to toilet. Pt required ModA for lower body dressing after toileting. Pt then continued block practice of sit to stand for RLE strengthening. Pt then practiced slideboard transfer with MinA + block for foot height. From bed, pt completed series of AAROM exercises with LLE including hip fl/ext and DF/PF. Husband then educated on how to assist [name] with husband demonstrating good body mechanics t/o assist. At end of session, pt was left seated in bed in chair position with alarm engaged, 4-rails lifted per request, nurse call bell and all needs in reach.     Therapy Documentation Precautions:  Precautions Precautions: Fall Precaution Booklet Issued: No Precaution Comments: L LE hemplegia Restrictions Weight Bearing Restrictions: No    Therapy/Group: Individual Therapy  Marquette Old, PT, DPT 03/15/2022, 12:40 PM

## 2022-03-15 NOTE — Evaluation (Addendum)
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[place])  Past Medical History:  Past Medical History:  Diagnosis Date   Arthritis    Cancer ([place])    Depression    Dyslipidemia    Essential hypertension 07/31/2015   Hypothyroidism 07/31/2015   doctor took her off medication   Past Surgical History:  Past Surgical History:  Procedure Laterality Date   BLADDER SUSPENSION     CRANIOTOMY Right 03/04/2021   Procedure: CRANIOTOMY TUMOR RESECTION W/ BRAIN LAB;  Surgeon: Newman Pies, MD;  Location: [facility];  Service: Neurosurgery;  Laterality: Right;   TONSILLECTOMY     TUBAL LIGATION      Assessment / Plan / Recommendation Clinical Impression CC: Functional deficits secondary to 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 Mickeal Skinner. On office visit 2/12, she reported weakness and decreased motor function of LLE. MRI total spine LMD screening arranged. Her symptoms worsened prior to study and she presented to [facility] ED for evaluation on 03/07/2022. 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  Dr. Mickeal Skinner contacted and neurosurgery consulted. Decadron started. On Dr. Hewitt Shorts evaluation, he stated "metastatic tumor, most likely glioblastoma multiforme, to the spinal cord at T6. Not resectable with any expectation to maintain lower extremity function". Radiation oncology consult obtained by Dr. Lisbeth Renshaw. Discussed potential palliative radiation treatment to the T6 level in 10  fractions. Noticed RLE weakness on 2/19. Plan to start radiation therapy on Wednesday, 2/21, reduce decadron to 4 mg daily at discharge and resume Temodar. She agreed to palliative care consultation. 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     PMH: Underwent craniotomy in February of 2023 by Dr. Arnoldo Morale. Treated with both radiation therapy and temozolomide. Maintained on Keppra and Vimpat, Elavil. Takes lisinopril for essential hypertension.  SLP consulted to complete clinical swallow and cognitive-linguistic evaluation in the setting of glioblastoma (R temporal). Pt greeted awake/alert and sitting EOB - bed alarm sounding - pt attempting to stand to pull her pants up without staff present. Provided education re: need for staff to prevent falls; she verbalized understanding, though will benefit from reinforcement. Agreeable to evaluation. Pleasant and participatory throughout.  Per informal and formal assessment measures, pt presents with at least mild cognitive impairment, particularly in the domains of executive functioning, visuospatial reasoning, recall, and higher-level attention, as evident by achieving a score of 22/30 on the [place] (n = 27 or greater for individuals with a high school education - pt with some college). Receptive and expressive language noted to be grossly intact for tasks assessed. Speech was noted to be fluent and intelligible despite baseline lisp. Prior to admission, pt was independent with most iADL's to include medication management and cleaning; did assist some with financial management. Lives with her husband and is on disability. Denies cognitive deficits prior to admission; however, pt with limited insight and overestimates her abilities at times. Also noted to  be impulsive with movement - states she  has always moved quickly.  Per CSE, pt with functional deglutition. No overt s/sx concerning for airway intrusion nor any oral deficits with intake of regular textures and thin liquids via straw. OME unremarkable. Continue to recommend regular diet with thin liquids via cup or straw. Medications may be administered whole with thin liquids. Swallowing goals do not appear indicated at this time.   Given clinical presentation and pt reports, recommend initiation of skilled ST intervention targeting aforementioned cognitive deficits, maximize pt's independence, and decrease caregiver burden upon d/c from CIR. Results and recommendations were reviewed with pt who verbalized understanding and agreement. Anticipate need for 24/7 supervision and assistance, as well as f/u ST intervention at next venue of care post-discharge.    Skilled Therapeutic Interventions          [facility] SLUMS, informal language assessment, and CSE completed. Please see full evaluation report for details.   SLP Assessment  Patient will need skilled Speech Lanaguage Pathology Services during CIR admission    Recommendations  SLP Diet Recommendations: Age appropriate regular solids;Thin Liquid Administration via: Cup;Straw Medication Administration: Whole meds with liquid Supervision: Patient able to self feed Compensations: Minimize environmental distractions;Slow rate;Small sips/bites Postural Changes and/or Swallow Maneuvers: Out of bed for meals;Seated upright 90 degrees;Upright 30-60 min after meal Oral Care Recommendations: Oral care BID Recommendations for Other Services: Neuropsych consult Patient destination: Home Follow up Recommendations: Home Health SLP;Outpatient SLP;24 hour supervision/assistance Equipment Recommended: None recommended by SLP    SLP Frequency 3 to 5 out of 7 days   SLP Duration  SLP Intensity  SLP Treatment/Interventions 14-18 days  Minumum of 1-2 x/day, 30 to 90 minutes  Cognitive  remediation/compensation;Functional tasks;Patient/family education;Medication managment;Internal/external aids    Pain Pain Assessment Pain Scale: 0-10 Pain Score: 0-No pain  Prior Functioning Cognitive/Linguistic Baseline: Within functional limits Type of Home: House  Lives With: Spouse Available Help at Discharge: Family;Available 24 hours/day Education: some college Vocation: On disability  SLP Evaluation Cognition Overall Cognitive Status: Impaired/Different from baseline Arousal/Alertness: Awake/alert Orientation Level: Oriented X4 Year: 2024 Month: February Day of Week: Correct Attention: Selective;Sustained Sustained Attention: Appears intact Selective Attention: Impaired Selective Attention Impairment: Verbal complex;Functional complex Memory: Impaired Memory Impairment: Decreased short term memory;Decreased recall of new information;Retrieval deficit Decreased Short Term Memory: Verbal complex;Functional complex Awareness: Impaired Awareness Impairment: Emergent impairment Problem Solving: Impaired Problem Solving Impairment: Verbal complex;Functional complex Executive Function:  (all impaired) Safety/Judgment: Impaired Comments: Pt impulsive in movements and often overestimating abilities  Comprehension Auditory Comprehension Overall Auditory Comprehension: Appears within functional limits for tasks assessed Yes/No Questions: Within Functional Limits Commands: Within Functional Limits Conversation: Complex Visual Recognition/Discrimination Discrimination: Not tested Reading Comprehension Reading Status: Not tested Expression Expression Primary Mode of Expression: Verbal Verbal Expression Overall Verbal Expression: Appears within functional limits for tasks assessed Initiation: No impairment Level of Generative/Spontaneous Verbalization: Conversation Naming: No impairment Pragmatics: No impairment Written Expression Dominant Hand: Right Written  Expression: Not tested Oral Motor Oral Motor/Sensory Function Overall Oral Motor/Sensory Function: Within functional limits Motor Speech Overall Motor Speech: Appears within functional limits for tasks assessed Intelligibility: Intelligible Motor Planning: Witnin functional limits Motor Speech Errors: Not applicable  Care Tool Care Tool Cognition Ability to hear (with hearing aid or hearing appliances if normally used Ability to hear (with hearing aid or hearing appliances if normally used): 0. Adequate - no difficulty in normal conservation, social interaction, listening to TV   Expression of Ideas and Wants Expression of Ideas and  Wants: 3. Some difficulty - exhibits some difficulty with expressing needs and ideas (e.g, some words or finishing thoughts) or speech is not clear   Understanding Verbal and Non-Verbal Content Understanding Verbal and Non-Verbal Content: 3. Usually understands - understands most conversations, but misses some part/intent of message. Requires cues at times to understand  Memory/Recall Ability Memory/Recall Ability : Current season;That he or she is in a hospital/hospital unit    Bedside Swallowing Assessment General Date of Onset: 03/15/22 Previous Swallow Assessment: None on file Diet Prior to this Study: Regular;Thin liquids (Level 0) Temperature Spikes Noted: No Respiratory Status: Room air History of Recent Intubation: No Behavior/Cognition: Alert;Cooperative;Pleasant mood Oral Cavity - Dentition: Adequate natural dentition Self-Feeding Abilities: Able to feed self Patient Positioning: Upright in chair/Tumbleform Baseline Vocal Quality: Normal Volitional Cough: Weak Volitional Swallow: Able to elicit  Ice Chips Ice chips: Not tested Thin Liquid Thin Liquid: Within functional limits Presentation: Straw Nectar Thick Nectar Thick Liquid: Not tested Honey Thick Honey Thick Liquid: Not tested Puree Puree: Not tested Solid Solid: Within  functional limits Presentation: Self Fed BSE Assessment Suspected Esophageal Findings Suspected Esophageal Findings:  (Pt reports hiatal hernia at baseline) Risk for Aspiration Impact on safety and function: Mild aspiration risk Other Related Risk Factors: History of esophageal-related issues;Cognitive impairment  Short Term Goals: Week 1: SLP Short Term Goal 1 (Week 1): Pt will complete functional, semi-complex iADL tasks with Min-Mod A to achieve 100% accuracy. SLP Short Term Goal 2 (Week 1): Pt will recall functional information re: her cares and therapy recommendations with Min A. SLP Short Term Goal 3 (Week 1): Pt will attend to functional and/or therapeutic tasks in a mildly distractiing environment and achieve 90% accuracy with Sup A.  Refer to Care Plan for Long Term Goals  Recommendations for other services: Neuropsych  Discharge Criteria: Patient will be discharged from SLP if patient refuses treatment 3 consecutive times without medical reason, if treatment goals not met, if there is a change in medical status, if patient makes no progress towards goals or if patient is discharged from hospital.  The above assessment, treatment plan, treatment alternatives and goals were discussed and mutually agreed upon: by patient  Romelle Starcher A Jennipher Weatherholtz 03/15/2022, 3:19 PM

## 2022-03-15 NOTE — Progress Notes (Signed)
Occupational Therapy Session Note  Patient Details  Name: Ciearra Leifer MRN: [id number] Date of Birth: 04-Apr-1965  Today's Date: 03/15/2022 OT Individual Time: 0215-0300 OT Individual Time Calculation (min): 45 min    Short Term Goals: Week 1:  OT Short Term Goal 1 (Week 1): Pt will complete sit > stand with mod A OT Short Term Goal 2 (Week 1): Pt will maintain dynamic sitting balance with (S) OT Short Term Goal 3 (Week 1): Pt will complete stand pivot transfer with LRAD with mod A OT Short Term Goal 4 (Week 1): Pt will maintain static standing balance during ADL with mod A with BUE support  Skilled Therapeutic Interventions/Progress Updates:    Upon arrival, the pt was lying in bed with family present at the time of OT treatment.  The pt indicated that she needed to go to the restroom and was able to transfer from bed LOF to EOB with MinA, she was able to transfer from sit to stand using the stedy with close S.  The 3 in 1 frame was placed over the commode for greater ease and the pt was able to transfer to the commode using the grab bars and the bar on the stedy with close S.  The pt went on to complete grooming task at sink LOF for washing her hands and brushing her teeth with s/u assist.   The pt went on to complete sit to stands on the stedy in front of the mirror to adjust her anatomical positioning, the pt completed 5 sit to stand, maintaining standing for a count of 10.  The pt went on to complete towel exercises EOB for shld flexion, horizontal abduction, and shld rotation 2 sets of 10 with rest breaks as needed. While completing towel exercise, the pt indicated that she was very fatigue and would like to return to bed LOF , the pt was able to transfer from EOB to supine in bed using her RLE to lift the LLE with MinA and MinA for going up in bed by incorporating the bed rails. The call light and bedside table were both within reach.  All additional needs of the patient were addressed  prior to exiting the room.  Therapy Documentation Precautions:  Precautions Precautions: Fall Precaution Booklet Issued: No Precaution Comments: L LE hemplegia Restrictions Weight Bearing Restrictions: No   Therapy/Group: Individual Therapy  Yvonne Kendall 03/15/2022, 3:48 PM

## 2022-03-15 NOTE — Plan of Care (Signed)
  Problem: RH Problem Solving Goal: LTG Patient will demonstrate problem solving for (SLP) Description: LTG:  Patient will demonstrate problem solving for basic/complex daily situations with cues  (SLP) Flowsheets (Taken 03/15/2022 2201) LTG: Patient will demonstrate problem solving for (SLP): Complex daily situations LTG Patient will demonstrate problem solving for: Minimal Assistance - Patient > 75%   Problem: RH Memory Goal: LTG Patient will demonstrate ability for day to day (SLP) Description: LTG:   Patient will demonstrate ability for day to day recall/carryover during cognitive/linguistic activities with assist  (SLP) Flowsheets (Taken 03/15/2022 2201) LTG: Patient will demonstrate ability for day to day recall: New information LTG: Patient will demonstrate ability for day to day recall/carryover during cognitive/linguistic activities with assist (SLP): Supervision   Problem: RH Attention Goal: LTG Patient will demonstrate this level of attention during functional activites (SLP) Description: LTG:  Patient will will demonstrate this level of attention during functional activites (SLP) Flowsheets (Taken 03/15/2022 2201) Patient will demonstrate during cognitive/linguistic activities the attention type of: Selective Patient will demonstrate this level of attention during cognitive/linguistic activities in: Controlled LTG: Patient will demonstrate this level of attention during cognitive/linguistic activities with assistance of (SLP): Supervision   Problem: RH Awareness Goal: LTG: Patient will demonstrate awareness during functional activites type of (SLP) Description: LTG: Patient will demonstrate awareness during functional activites type of (SLP) Flowsheets (Taken 03/15/2022 2201) Patient will demonstrate during cognitive/linguistic activities awareness type of: Emergent LTG: Patient will demonstrate awareness during cognitive/linguistic activities with assistance of (SLP):  Supervision

## 2022-03-15 NOTE — Progress Notes (Signed)
PROGRESS NOTE   Subjective/Complaints: Slept decently last night, hard time staying asleep sometimes. LBM yesterday. Peeing normally, and has been fine. Pain doing ok, mostly in her legs and low back, but well controlled with pain meds. Denies any other complaints.  Unsure of timing of IV removal.   Review of Systems  Constitutional:  Negative for chills and fever.  Respiratory:  Negative for shortness of breath.   Cardiovascular:  Negative for chest pain.  Gastrointestinal:  Positive for constipation (improved). Negative for nausea and vomiting.  Genitourinary:  Negative for dysuria and frequency.  Musculoskeletal:  Positive for joint pain.  Neurological:  Positive for tingling, sensory change and focal weakness.      Objective:   No results found. Recent Labs    03/14/22 0750  WBC 11.0*  HGB 15.4*  HCT 44.3  PLT 199   Recent Labs    03/14/22 0750  NA 134*  K 4.5  CL 101  CO2 16*  GLUCOSE 178*  BUN 20  CREATININE 0.69  CALCIUM 8.2*    Intake/Output Summary (Last 24 hours) at 03/15/2022 0829 Last data filed at 03/14/2022 1900 Gross per 24 hour  Intake 520 ml  Output 775 ml  Net -255 ml         Physical Exam: Vital Signs Blood pressure (!) 138/90, pulse 81, temperature 97.7 F (36.5 C), resp. rate 18, height '5\' 4"'$  (1.626 m), weight 90.3 kg, SpO2 96 %.  Constitutional: No apparent distress. Appropriate appearance for age. Sitting up in w/c HENT: [place], AT Eyes: PERRLA. EOMI. Visual fields grossly intact. + nystagmus and discomfort with R gaze-not reassessed Cardiovascular: RRR, no murmurs/rub/gallops. Respiratory: CTAB. No RRW Abdomen:Soft, NT, ND, + BS Skin: C/D/I. No apparent lesions. MSK:      No apparent deformity.   Prior exam Strength:                RUE: 5/5 SA, 5/5 EF, 5/5 EE, 5/5 WE, 5/5 FF, 5/5 FA                 LUE: 5/5 SA, 5/5 EF, 5/5 EE, 5/5 WE, 5/5 FF, 5/5 FA                 RLE:  5/5 HF, 5/5 KE, 5/5 DF, 5/5 EHL, 5/5 PF                 LLE:  2/5 HF, 1+/5 KE, 0/5 DF, 3/5 EHL, 1/5 PF  Neurologic exam: Strength able to left bilateral upper extremity and right lower extremity to gravity, unable to lift left lower extremity to gravity, alert and awake, no dysarthria, patchy sensory loss approximately T5 and below on the left.,  Cranial nerves II through XII grossly intact, no abnormal tone   Assessment/Plan: 1. Functional deficits which require 3+ hours per day of interdisciplinary therapy in a comprehensive inpatient rehab setting. Physiatrist is providing close team supervision and 24 hour management of active medical problems listed below. Physiatrist and rehab team continue to assess barriers to discharge/monitor patient progress toward functional and medical goals  Care Tool:  Bathing    Body parts bathed by patient: Left upper leg,  Right arm, Left arm, Right lower leg, Chest, Abdomen, Left lower leg, Front perineal area, Face, Buttocks, Right upper leg         Bathing assist Assist Level: Moderate Assistance - Patient 50 - 74%     Upper Body Dressing/Undressing Upper body dressing   What is the patient wearing?: Pull over shirt    Upper body assist Assist Level: Contact Guard/Touching assist    Lower Body Dressing/Undressing Lower body dressing      What is the patient wearing?: Underwear/pull up, Pants     Lower body assist Assist for lower body dressing: Maximal Assistance - Patient 25 - 49%     Toileting Toileting    Toileting assist Assist for toileting: Maximal Assistance - Patient 25 - 49%     Transfers Chair/bed transfer  Transfers assist     Chair/bed transfer assist level: Maximal Assistance - Patient 25 - 49%     Locomotion Ambulation   Ambulation assist   Ambulation activity did not occur: Safety/medical concerns          Walk 10 feet activity   Assist  Walk 10 feet activity did not occur: Safety/medical  concerns        Walk 50 feet activity   Assist Walk 50 feet with 2 turns activity did not occur: Safety/medical concerns         Walk 150 feet activity   Assist Walk 150 feet activity did not occur: Safety/medical concerns         Walk 10 feet on uneven surface  activity   Assist Walk 10 feet on uneven surfaces activity did not occur: Safety/medical concerns         Wheelchair     Assist Is the patient using a wheelchair?: Yes Type of Wheelchair: Manual (TIS at this time for safety)    Wheelchair assist level: Dependent - Patient 0%      Wheelchair 50 feet with 2 turns activity    Assist        Assist Level: Dependent - Patient 0%   Wheelchair 150 feet activity     Assist      Assist Level: Dependent - Patient 0%   Blood pressure (!) 138/90, pulse 81, temperature 97.7 F (36.5 C), resp. rate 18, height '5\' 4"'$  (1.626 m), weight 90.3 kg, SpO2 96 %.  Medical Problem List and Plan: 1. 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  Antithrombotics: -DVT/anticoagulation:  Pharmaceutical: Lovenox '40mg'$  QD             -antiplatelet therapy: none   3. Pain Management:  -continue oxycodone 10 mg 5 x daily -continue Flexeril 5-10 mg q HS -continue Voltaren gel 2g QID, Advil '400mg'$  q6h PRN and Tylenol as needed, Robaxin '500mg'$  q6h PRN -Consider adjusting oxycodone to as needed   4. Mood/Behavior/Sleep: LCSW to evaluate and provide emotional support             -antipsychotic agents: n/a             -continue Lexapro '10mg'$  QHS and Elavil '100mg'$  QHS  -Trazodone 25-'50mg'$  QHS PRN  -03/15/22  start '6mg'$  melatonin QHS   5. Neuropsych/cognition: This patient is capable of making decisions on her own behalf.   6.  Skin/Wound Care: Routine skin care checks   7. 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:             -continue Decadron 6 mg q 6 hours -continue Keppra 1500 mg BID and Vimpat 50 mg BID for seizure ppx             - XRT x10 followed by oral chemotherapy   10: Obesity, Class III             - Complicates medical decision making; nutrition onboard   11: GI prophylaxis: Protonix '40mg'$  BID   12: Constipation vs neurogenic bowel: Given smog enema yesterday             -Senokot 2tabs QHS             -Miralax 17g BID  -PRNs: MoM, Fleet's, Sorbitol 49m -2/23 had a large bowel movement yesterday, had bowel movements the day prior also, continue to monitor -03/15/22 LBM yesterday, monitor on current regimen  13. Urinary urgency vs. Neurogenic bladder. Continent.              - UA 2/22 negative except for small HgB  14.  Leukocytosis: 11.0 on 03/14/22 -Likely due to Decadron/hemoconcentration, no signs of infection, monitor weekly labs, next 03/17/22  L[place]: [place] days [place], 8:29 AM

## 2022-03-16 DIAGNOSIS — R203 Hyperesthesia: Secondary | ICD-10-CM

## 2022-03-16 DIAGNOSIS — S24109S Unspecified injury at unspecified level of thoracic spinal cord, sequela: Secondary | ICD-10-CM | POA: Diagnosis not present

## 2022-03-16 NOTE — Progress Notes (Signed)
PROGRESS NOTE   Subjective/Complaints: Slept better overnight. Buttocks is feeling a little more numb/tingly than it had, having a lot of hyperesthesia of the abdomen and thighs but it isn't painful.  LBM yesterday and today. Urinating well. Pain is tolerable, meds helping.  No other complaints or concerns today.   Review of Systems  Constitutional:  Negative for chills and fever.  Respiratory:  Negative for shortness of breath.   Cardiovascular:  Negative for chest pain.  Gastrointestinal:  Negative for abdominal pain, constipation, nausea and vomiting.  Genitourinary:  Negative for dysuria and frequency.  Musculoskeletal:  Positive for joint pain.  Neurological:  Positive for tingling, sensory change (hyperesthesia thighs/abdomen) and focal weakness.      Objective:   No results found. Recent Labs    03/14/22 0750  WBC 11.0*  HGB 15.4*  HCT 44.3  PLT 199   Recent Labs    03/14/22 0750  NA 134*  K 4.5  CL 101  CO2 16*  GLUCOSE 178*  BUN 20  CREATININE 0.69  CALCIUM 8.2*    Intake/Output Summary (Last 24 hours) at 03/16/2022 1125 Last data filed at 03/16/2022 0734 Gross per 24 hour  Intake 951 ml  Output 450 ml  Net 501 ml        Physical Exam: Vital Signs Blood pressure 106/74, pulse 97, temperature (!) 96.7 F (35.9 C), temperature source Axillary, resp. rate 16, height '5\' 4"'$  (1.626 m), weight 90.3 kg, SpO2 96 %.  Constitutional: No apparent distress. Appropriate appearance for age. Laying in bed HENT: [place], AT Eyes: PERRLA. EOMI. Visual fields grossly intact. + nystagmus and discomfort with R gaze Cardiovascular: RRR, no murmurs/rub/gallops. Respiratory: CTAB. No RRW Abdomen:Soft, NT, ND, + BS Skin: C/D/I. No apparent lesions. Slight hyperesthesia noted with light touch of abdomen and thighs.  MSK:      No apparent deformity.   Prior exam Strength:                RUE: 5/5 SA, 5/5 EF, 5/5  EE, 5/5 WE, 5/5 FF, 5/5 FA                 LUE: 5/5 SA, 5/5 EF, 5/5 EE, 5/5 WE, 5/5 FF, 5/5 FA                 RLE: 5/5 HF, 5/5 KE, 5/5 DF, 5/5 EHL, 5/5 PF                 LLE:  2/5 HF, 1+/5 KE, 0/5 DF, 3/5 EHL, 1/5 PF  Neurologic exam: Strength able to lift bilateral upper extremity and right lower extremity to gravity, unable to lift left lower extremity to gravity, alert and awake, no dysarthria, patchy sensory loss approximately T5 and below on the left.,  Cranial nerves II through XII grossly intact, no abnormal tone   Assessment/Plan: 1. Functional deficits which require 3+ hours per day of interdisciplinary therapy in a comprehensive inpatient rehab setting. Physiatrist is providing close team supervision and 24 hour management of active medical problems listed below. Physiatrist and rehab team continue to assess barriers to discharge/monitor patient progress toward functional and medical goals  Care  Tool:  Bathing    Body parts bathed by patient: Left upper leg, Right arm, Left arm, Right lower leg, Chest, Abdomen, Left lower leg, Front perineal area, Face, Buttocks, Right upper leg         Bathing assist Assist Level: Moderate Assistance - Patient 50 - 74%     Upper Body Dressing/Undressing Upper body dressing   What is the patient wearing?: Pull over shirt    Upper body assist Assist Level: Contact Guard/Touching assist    Lower Body Dressing/Undressing Lower body dressing      What is the patient wearing?: Underwear/pull up, Pants     Lower body assist Assist for lower body dressing: Maximal Assistance - Patient 25 - 49%     Toileting Toileting    Toileting assist Assist for toileting: Maximal Assistance - Patient 25 - 49%     Transfers Chair/bed transfer  Transfers assist     Chair/bed transfer assist level: Maximal Assistance - Patient 25 - 49%     Locomotion Ambulation   Ambulation assist   Ambulation activity did not occur: Safety/medical  concerns          Walk 10 feet activity   Assist  Walk 10 feet activity did not occur: Safety/medical concerns        Walk 50 feet activity   Assist Walk 50 feet with 2 turns activity did not occur: Safety/medical concerns         Walk 150 feet activity   Assist Walk 150 feet activity did not occur: Safety/medical concerns         Walk 10 feet on uneven surface  activity   Assist Walk 10 feet on uneven surfaces activity did not occur: Safety/medical concerns         Wheelchair     Assist Is the patient using a wheelchair?: Yes Type of Wheelchair: Manual (TIS at this time for safety)    Wheelchair assist level: Dependent - Patient 0%      Wheelchair 50 feet with 2 turns activity    Assist        Assist Level: Dependent - Patient 0%   Wheelchair 150 feet activity     Assist      Assist Level: Dependent - Patient 0%   Blood pressure 106/74, pulse 97, temperature (!) 96.7 F (35.9 C), temperature source Axillary, resp. rate 16, height '5\' 4"'$  (1.626 m), weight 90.3 kg, SpO2 96 %.  Medical Problem List and Plan: 1. 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  Antithrombotics: -DVT/anticoagulation:  Pharmaceutical: Lovenox '40mg'$  QD             -antiplatelet therapy: none   3. Pain Management:  -continue oxycodone 10 mg 5 x daily -continue Flexeril 5-10 mg q HS -continue Voltaren gel 2g QID, Advil '400mg'$  q6h PRN and Tylenol as needed, Robaxin '500mg'$  q6h PRN -Consider adjusting oxycodone to as needed -03/16/22 pain well controlled but having hyperesthesias-- discussed letting us know if this becomes more painful, could add meds if needed   4. Mood/Behavior/Sleep: LCSW to evaluate and provide  emotional support             -  antipsychotic agents: n/a             -continue Lexapro '10mg'$  QHS and Elavil '100mg'$  QHS  -Trazodone 25-'50mg'$  QHS PRN  -03/15/22 start '6mg'$  melatonin QHS-helping 03/16/22, continue   5. Neuropsych/cognition: This patient is capable of making decisions on her own behalf.   6. Skin/Wound Care: Routine skin care checks   7. Fluids/Electrolytes/Nutrition: Routine Is and Os and follow-up chemistries, next 03/17/22  -03/15/22 Na 134 noted 03/14/22, monitor on following labs next week  -?IV removal this week if appropriate, per weekday team   8: Hypertension: monitor TID and prn             -continue lisinopril 10 mg daily  -03/16/22 Well-controlled, monitor Vitals:   03/13/22 1607 03/13/22 1630 03/13/22 1734 03/13/22 2127  BP: 134/64 128/84 128/84 135/87   03/14/22 0553 03/14/22 1448 03/14/22 1946 03/15/22 0512  BP: 134/84 (!) 127/95 119/82 (!) 138/90   03/15/22 1318 03/15/22 1926 03/16/22 0539 03/16/22 0854  BP: 110/71 124/73 114/69 106/74       9: Glioblastoma s/p resection, 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:             -continue Decadron 6 mg q 6 hours -continue Keppra 1500 mg BID and Vimpat 50 mg BID for seizure ppx             - XRT x10 followed by oral chemotherapy   10: Obesity, Class III             - Complicates medical decision making; nutrition onboard   11: GI prophylaxis: Protonix '40mg'$  BID   12: Constipation vs neurogenic bowel: Given smog enema yesterday             -Senokot 2tabs QHS             -Miralax 17g BID  -PRNs: MoM, Fleet's, Sorbitol 50m -2/23 had a large bowel movement yesterday, had bowel movements the day prior also, continue to monitor -03/16/22 LBM yesterday and today, monitor on current regimen  13. Urinary urgency vs. Neurogenic bladder. Continent.              - UA 2/22 negative except for small HgB  14.  Leukocytosis: 11.0 on 03/14/22 -Likely due to Decadron/hemoconcentration, no signs of infection, monitor weekly labs, next  03/17/22  L[place]: [place], 11:25 AM

## 2022-03-16 NOTE — Progress Notes (Signed)
   03/16/22 0820  What Happened  Was fall witnessed? Yes  Who witnessed fall? Carlis Burnsworth, RN  Patients activity before fall to/from bed, chair, or stretcher  Point of contact buttocks  Was patient injured? No  Provider Notification  Provider Name/Title Dr. Dagoberto Ligas and Dr. Tressa Busman  Date Provider Notified 03/16/22  Time Provider Notified [phone]  Method of Notification Page  Notification Reason Fall  Provider response No new orders  Follow Up  Family notified Yes - comment (Pts. husband notified by patient "around 9:00")  Additional tests No  Progress note created (see row info) Yes  Adult Fall Risk Assessment  Risk Factor Category (scoring not indicated) High fall risk per protocol (document High fall risk)  Patient Fall Risk Level High fall risk  Adult Fall Risk Interventions  Required Bundle Interventions *See Row Information* High fall risk - low, moderate, and high requirements implemented  Additional Interventions Use of appropriate toileting equipment (bedpan, BSC, etc.)  Screening for Fall Injury Risk (To be completed on HIGH fall risk patients) - Assessing Need for Floor Mats  Risk For Fall Injury- Criteria for Floor Mats Previous fall this admission  Will Implement Floor Mats Yes

## 2022-03-16 NOTE — IPOC Note (Signed)
Overall Plan of Care [facility]) Patient Details Name: Kelly [name] MRN: [id number] DOB: September 21, 1965  Admitting Diagnosis: Thoracic spinal cord injury, sequela [facility])  Hospital Problems: Principal Problem:   Thoracic spinal cord injury, sequela ([place]) Active Problems:   Glioblastoma multiforme ([place])     Functional Problem List: Nursing Pain, Bladder, Bowel, Safety, Sensory, Endurance, Medication Management, Motor  PT Balance, Pain, Perception, Safety, Endurance, Sensory, Motor, Skin Integrity  OT Balance, Safety, Cognition, Perception, Endurance, Motor, Pain  SLP Cognition, Safety  TR         Basic ADL's: OT Bathing, Dressing, Toileting     Advanced  ADL's: OT       Transfers: PT Bed Mobility, Bed to Chair, Teacher, early years/pre, Tub/Shower     Locomotion: PT Wheelchair Mobility, Ambulation     Additional Impairments: OT None  SLP Social Cognition   Problem Solving, Memory, Awareness, Attention  TR      Anticipated Outcomes Item Anticipated Outcome  Self Feeding no goal set  Swallowing  N/A   Basic self-care  min A  Toileting  min A   Bathroom Transfers min A  Bowel/Bladder  continent B/B  Transfers  min A with LRAD  Locomotion  mod A therapeutic gait, supervision w/c  Communication  N/A  Cognition  Sup to Min A  Pain  less than 4  Safety/Judgment  remain fall free while in rehab   Therapy Plan: PT Intensity: Minimum of 1-2 x/day ,45 to 90 minutes PT Frequency: 5 out of 7 days PT Duration Estimated Length of Stay: 3-3.5 weeks OT Intensity: Minimum of 1-2 x/day, 45 to 90 minutes OT Frequency: 5 out of 7 days OT Duration/Estimated Length of Stay: 3 weeks SLP Intensity: Minumum of 1-2 x/day, 30 to 90 minutes SLP Frequency: 3 to 5 out of 7 days SLP Duration/Estimated Length of Stay: 14-18 days   Team Interventions: Nursing Interventions Patient/Family Education, Pain Management, Bladder Management, Medication Management, Discharge Planning, Bowel  Management, Psychosocial Support, Disease Management/Prevention  PT interventions Ambulation/gait training, Cognitive remediation/compensation, Discharge planning, DME/adaptive equipment instruction, Functional mobility training, Pain management, Psychosocial support, Splinting/orthotics, Therapeutic Activities, UE/LE Strength taining/ROM, Visual/perceptual remediation/compensation, Wheelchair propulsion/positioning, UE/LE Coordination activities, Therapeutic Exercise, Stair training, Skin care/wound management, Patient/family education, Functional electrical stimulation, Neuromuscular re-education, Disease management/prevention, Academic librarian, Training and development officer  OT Interventions Training and development officer, Discharge planning, Pain management, Functional electrical stimulation, Self Care/advanced ADL retraining, Therapeutic Activities, UE/LE Coordination activities, Cognitive remediation/compensation, Disease mangement/prevention, Functional mobility training, Patient/family education, Therapeutic Exercise, Community reintegration, Engineer, drilling, Wheelchair propulsion/positioning, UE/LE Strength taining/ROM, Psychosocial support, Neuromuscular re-education  SLP Interventions Cognitive remediation/compensation, Functional tasks, Patient/family education, Medication managment, Internal/external aids  TR Interventions    SW/CM Interventions Discharge Planning, Psychosocial Support, Patient/Family Education   Barriers to Discharge MD  Medical stability, Neurogenic bowel and bladder, and Weight  Nursing Decreased caregiver support, Inaccessible home environment, Home environment access/layout, Neurogenic Bowel & Bladder, Insurance for SNF coverage, Weight bearing restrictions, Pending chemo/radiation home with spouse to 1 level with 5 ste rail on right  PT Home environment access/layout, Pending chemo/radiation, Neurogenic Bowel & Bladder    OT Pending  chemo/radiation    SLP      SW Insurance for SNF coverage     Team Discharge Planning: Destination: PT-Home ,OT- Home , SLP-Home Projected Follow-up: PT-Home health PT, OT-  Home health OT, SLP-Home Health SLP, Outpatient SLP, 24 hour supervision/assistance Projected Equipment Needs: PT-To be determined, OT- To be determined, SLP-None recommended by SLP Equipment Details: PT-likely RW  and custom w/c, OT-  Patient/family involved in discharge planning: PT- Pt and family member:daughter ,  OT-Patient, Family member/caregiver, SLP-Patient  MD ELOS: 7-10 Medical Rehab Prognosis:  Excellent Assessment: The patient has been admitted for CIR therapies with the diagnosis of 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 The team will be addressing functional mobility, strength, stamina, balance, safety, adaptive techniques and equipment, self-care, bowel and bladder mgt, patient and caregiver education. Goals have been set at [place] PT/OT/SLP . Anticipated discharge destination is home.        See Team Conference Notes for weekly updates to the plan of care

## 2022-03-16 NOTE — Progress Notes (Signed)
Pt. Had an assisted fall while getting up from bed with the stedy. No signs of injury noted. Pt. Denies any new pain. Pt reports, "I didn't fall, I sat down." Charge RN notified and also came to bedside to assist patient back into the stedy. Provider made aware.

## 2022-03-16 NOTE — Progress Notes (Signed)
Speech Language Pathology Daily Session Note  Patient Details  Name: Kelly [name] MRN: [id number] Date of Birth: [birth year]  Today's Date: 03/16/2022 SLP Individual Time: 1315-1415 SLP Individual Time Calculation (min): 60 min  Short Term Goals: Week 1: SLP Short Term Goal 1 (Week 1): Pt will complete functional, semi-complex iADL tasks with Min-Mod A to achieve 100% accuracy. SLP Short Term Goal 2 (Week 1): Pt will recall functional information re: her cares and therapy recommendations with Min A. SLP Short Term Goal 3 (Week 1): Pt will attend to functional and/or therapeutic tasks in a mildly distractiing environment and achieve 90% accuracy with Sup A.  Skilled Therapeutic Interventions:   Pt seen for skilled SLP session to address functional cognitive goals. 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 She reviewed multiple duties that she was previously responsible for, though unfortunately, unable to continue working following craniotomy in 02/2021. Pt reported feeling like she is currently at her cognitive baseline. She denied hx of previous SLP intervention, even following craniotomy last year.  She reported some baseline memory deficits and independently uses compensatory memory strategies as needed. Her strategies include using a planner and calendar to track appointments, making lists, and auto pay set up for most of her finances. She reported using a pill organizer to manage medications. Recommend address medication management next session. Pt completed safety awareness task of identify and finding solution for pictured safety hazards with 100% accuracy independently. She completed visual memory task of pictured scenes with >90% accuracy independently and short term recall of unfamiliar appointment with >80% accuracy. Pt able to correct errors with min cues. Pt challenged with portion of money counting exercise. She was limited with identifying dollar  amount or type of coin due to her vision, though was able to complete task with 100% accuracy with orientation to dollar/coin type. Pt left sitting up in bed with bed alarm set and call bell within reach. Continue SLP PoC.   Pain Pain Assessment Pain Scale: 0-10 Pain Score: 0-No pain  Therapy/Group: Individual Therapy  Wyn Forster 03/16/2022, 4:09 PM

## 2022-03-17 ENCOUNTER — Ambulatory Visit
Admit: 2022-03-17 | Discharge: 2022-03-17 | Disposition: A | Discharge status: Home / Self Care | Payer: Medicaid Other | Attending: Radiation Oncology | Admitting: Radiation Oncology

## 2022-03-17 ENCOUNTER — Other Ambulatory Visit: Payer: Self-pay

## 2022-03-17 DIAGNOSIS — E871 Hypo-osmolality and hyponatremia: Secondary | ICD-10-CM

## 2022-03-17 DIAGNOSIS — R7989 Other specified abnormal findings of blood chemistry: Secondary | ICD-10-CM

## 2022-03-17 DIAGNOSIS — D72829 Elevated white blood cell count, unspecified: Secondary | ICD-10-CM

## 2022-03-17 DIAGNOSIS — M25561 Pain in right knee: Secondary | ICD-10-CM

## 2022-03-17 DIAGNOSIS — Z510 Encounter for antineoplastic radiation therapy: Secondary | ICD-10-CM | POA: Diagnosis not present

## 2022-03-17 DIAGNOSIS — M25562 Pain in left knee: Secondary | ICD-10-CM

## 2022-03-17 DIAGNOSIS — I10 Essential (primary) hypertension: Secondary | ICD-10-CM

## 2022-03-17 DIAGNOSIS — S24109A Unspecified injury at unspecified level of thoracic spinal cord, initial encounter: Secondary | ICD-10-CM

## 2022-03-17 DIAGNOSIS — K5900 Constipation, unspecified: Secondary | ICD-10-CM

## 2022-03-17 LAB — BASIC METABOLIC PANEL
Anion gap: 6 (ref 5–15)
BUN: 24 mg/dL — ABNORMAL HIGH (ref 6–20)
CO2: 26 mmol/L (ref 22–32)
Calcium: 8 mg/dL — ABNORMAL LOW (ref 8.9–10.3)
Chloride: 100 mmol/L (ref 98–111)
Creatinine, Ser: 0.67 mg/dL (ref 0.44–1.00)
GFR, Estimated: >60 mL/min (ref >60)
Glucose, Bld: 168 mg/dL — ABNORMAL HIGH (ref 70–99)
Potassium: 4.1 mmol/L (ref 3.5–5.1)
Sodium: 132 mmol/L — ABNORMAL LOW (ref 135–145)

## 2022-03-17 LAB — RAD ONC ARIA SESSION SUMMARY
Course Elapsed Days: 5
Plan Fractions Treated to Date: 4
Plan Prescribed Dose Per Fraction: 3 Gy
Plan Total Fractions Prescribed: 10
Plan Total Prescribed Dose: 30 Gy
Reference Point Dosage Given to Date: 12 Gy
Reference Point Session Dosage Given: 3 Gy
Session Number: 4

## 2022-03-17 LAB — CBC
HCT: 43 % (ref 36.0–46.0)
Hemoglobin: 14.5 g/dL (ref 12.0–15.0)
MCH: 35.2 pg — ABNORMAL HIGH (ref 26.0–34.0)
MCHC: 33.7 g/dL (ref 30.0–36.0)
MCV: 104.4 fL — ABNORMAL HIGH (ref 80.0–100.0)
Platelets: 191 10*3/uL (ref 150–400)
RBC: 4.12 MIL/uL (ref 3.87–5.11)
RDW: 11.9 % (ref 11.5–15.5)
WBC: 8.2 10*3/uL (ref 4.0–10.5)
nRBC: 0 % (ref 0.0–0.2)

## 2022-03-17 MED ORDER — SODIUM CHLORIDE 1 G PO TABS
1.0000 g | ORAL_TABLET | Freq: Two times a day (BID) | ORAL | Status: DC
  Administered 2022-03-17 – 2022-03-28 (×22): 1 g via ORAL
  Filled 2022-03-17 (×22): qty 1

## 2022-03-17 NOTE — Plan of Care (Signed)
  Problem: RH Problem Solving Goal: LTG Patient will demonstrate problem solving for (SLP) Description: LTG:  Patient will demonstrate problem solving for basic/complex daily situations with cues  (SLP) Outcome: Completed/Met   Problem: RH Memory Goal: LTG Patient will demonstrate ability for day to day (SLP) Description: LTG:   Patient will demonstrate ability for day to day recall/carryover during cognitive/linguistic activities with assist  (SLP) Outcome: Completed/Met   Problem: RH Attention Goal: LTG Patient will demonstrate this level of attention during functional activites (SLP) Description: LTG:  Patient will will demonstrate this level of attention during functional activites (SLP) Outcome: Completed/Met   Problem: RH Awareness Goal: LTG: Patient will demonstrate awareness during functional activites type of (SLP) Description: LTG: Patient will demonstrate awareness during functional activites type of (SLP) Outcome: Completed/Met   

## 2022-03-17 NOTE — Progress Notes (Signed)
Occupational Therapy Session Note  Patient Details  Name: Kelly [name] MRN: [id number] Date of Birth: [birth year]  Today's Date: 03/17/2022 OT Individual Time: [id number] OT Individual Time Calculation (min): 50 min    Short Term Goals: Week 1:  OT Short Term Goal 1 (Week 1): Pt will complete sit > stand with mod A OT Short Term Goal 2 (Week 1): Pt will maintain dynamic sitting balance with (S) OT Short Term Goal 3 (Week 1): Pt will complete stand pivot transfer with LRAD with mod A OT Short Term Goal 4 (Week 1): Pt will maintain static standing balance during ADL with mod A with BUE support  Skilled Therapeutic Interventions/Progress Updates:    Pt greeted semi-reclined in bed awake and agreeable to OT treatment session. OT issued reacher and educated on uses. Pt want ed to try to get her LE's into figure 4 position with min A due to LLE being so heavy. Pt able to then thread pant legs with min A. L PRAFO donned again to keep ankle from rolling out in standing. Sit<>stand in [place] with mod A from lower surface. Stedy transfer to [facility] over toilet. Pt voided bladder after some time seated. Pt able to perform peri-care in sitting, then mod A to stand and Min A to manage clothing in [place]. Worked on sit<>stands in [place] at the sink with min A and min A for balance when standing in [place] to brush teeth and wash hands. Pt transferred to [place] wc in the room. Felt TIS wc was too large and pt did not need the tilt function given improved trunk control. OT issued standard wc, Stedy transfer from TIS to standard wc with min A. Pt left seated in wc with alarm belt on, call bell in reach, and needs met.   Therapy Documentation Precautions:  Precautions Precautions: Fall Precaution Booklet Issued: No Precaution Comments: L LE hemplegia Restrictions Weight Bearing Restrictions: No  Pain:  Denies pain   Therapy/Group: Individual Therapy  Valma Cava 03/17/2022, 9:07 AM

## 2022-03-17 NOTE — Progress Notes (Signed)
Physical Therapy Session Note  Patient Details  Name: Kelly [name] MRN: [id number] Date of Birth: 24-Dec-1965  Today's Date: 03/17/2022 PT Individual Time: 1300-1330 PT Individual Time Calculation (min): 30 min   Short Term Goals: Week 1:  PT Short Term Goal 1 (Week 1): Pt will perform STS with mod A PT Short Term Goal 2 (Week 1): Pt will perform SPT with LRAD and max A consistently PT Short Term Goal 3 (Week 1): Pt will perform bed mobility with CGA  Skilled Therapeutic Interventions/Progress Updates:      Therapy Documentation Precautions:  Precautions Precautions: Fall Precaution Booklet Issued: No Precaution Comments: L LE hemplegia Restrictions Weight Bearing Restrictions: No  Pt received semi-reclined in bed, agreeable to PT session and without verbal reports of pain. Pt encouraged as she reports she was able to stand for the first time this morning. Pt requires CGA with supine to sit and mod A with squat pivot from bed >w/c>toilet, successfully voided. Pt participated in blocked practice of 5 sit to stand transfers min A with RW with verbal cues for hand placement. Pt left semi-reclined in bed with all needs in reach and alarm on.      Therapy/Group: Individual Therapy  Verl Dicker Verl Dicker PT, DPT  03/17/2022, 12:42 PM

## 2022-03-17 NOTE — Progress Notes (Addendum)
Speech Language Pathology Daily Session Note  Patient Details  Name: Kelly [name] MRN: [id number] Date of Birth: January 16, 1966  Today's Date: 03/17/2022 SLP Individual Time: 0930-1015 SLP Individual Time Calculation (min): 45 min  Short Term Goals: Week 1: SLP Short Term Goal 1 (Week 1): Pt will complete functional, semi-complex iADL tasks with Min-Mod A to achieve 100% accuracy. SLP Short Term Goal 1 - Progress (Week 1): Met SLP Short Term Goal 2 (Week 1): Pt will recall functional information re: her cares and therapy recommendations with Min A. SLP Short Term Goal 2 - Progress (Week 1): Met SLP Short Term Goal 3 (Week 1): Pt will attend to functional and/or therapeutic tasks in a mildly distractiing environment and achieve 90% accuracy with Sup A. SLP Short Term Goal 3 - Progress (Week 1): Met  Skilled Therapeutic Interventions: Skilled ST treatment focused on cognitive goals. Pt was greeted upright in wheelchair on arrival and in good spirits. Pt recalled tasks completed earlier today in thorough detail and was able to discuss current PT/OT goals and tasks with independence with what appeared to be excellent insight into deficits. 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 Pt discussed use of various memory compensations, such as written lists, reminders, calendar, etc.   SLP facilitated BID pillbox organization with 100% accuracy at independent level. Pt completed task within a mildly distracting environment while simultaneously engaging in conversation without any observed distraction. Pt also assessed a simulated pillbox for errors with 100% accuracy at mod I level to re-assess pillbox x2 for accuracy with visuospatial skills. Pt excelled with medication management safety scenarios d/t medical background. Anticipate pt will be able to manage medications at discharge with mod I with family  present to assess for accuracy.   Pt discussed feeling at her cognitive baseline and respectfully requested to focus on PT/OT interventions at this time. Pt will have necessary family support at discharge. Plan to sign off from ST services at this time.   Patient was left in bed with alarm activated and immediate needs within reach at end of session.      Pain Pain Assessment Pain Scale: 0-10 Pain Score: 7  Pain Type: Acute pain Pain Location: Leg Pain Orientation: Left Pain Frequency: Constant Pain Intervention(s): Medication (See eMAR)None/denied  Therapy/Group: Individual Therapy  Patty Sermons 03/17/2022, 12:30 PM

## 2022-03-17 NOTE — Progress Notes (Signed)
Met with patient, works at [employer]. Oriented to rehab. Reports that treatments are going well. She wasn't sure when she was to start oral chemo.  Looks like oral will start once the radiation ends.  Encouraged to monitor B/P at home as may change once home. Reports still having bowel issues and sometimes takes a while to void. Will notify staff to do bladder scans. Notified of team conference every Tuesday. She reports that should discharge after the 5th. We will discuss in conference tomorrow. All needs met.

## 2022-03-17 NOTE — Progress Notes (Signed)
PROGRESS NOTE   Subjective/Complaints: Reports pain is controlled with current medications.   Review of Systems  Constitutional:  Negative for chills and fever.  HENT:  Negative for congestion.   Respiratory:  Negative for cough and shortness of breath.   Cardiovascular:  Negative for chest pain.  Gastrointestinal:  Negative for abdominal pain, constipation, nausea and vomiting.  Genitourinary:  Negative for dysuria and frequency.  Musculoskeletal:  Positive for joint pain.  Neurological:  Positive for tingling, sensory change (hyperesthesia thighs/abdomen) and focal weakness.      Objective:   No results found. Recent Labs    03/17/22 0717  WBC 8.2  HGB 14.5  HCT 43.0  PLT 191    Recent Labs    03/17/22 0717  NA 132*  K 4.1  CL 100  CO2 26  GLUCOSE 168*  BUN 24*  CREATININE 0.67  CALCIUM 8.0*     Intake/Output Summary (Last 24 hours) at 03/17/2022 0835 Last data filed at 03/17/2022 0700 Gross per 24 hour  Intake 950 ml  Output 1001 ml  Net -51 ml         Physical Exam: Vital Signs Blood pressure (!) 119/93, pulse 71, temperature 97.8 F (36.6 C), resp. rate 18, height '5\' 4"'$  (1.626 m), weight 90.3 kg, SpO2 96 %.  Constitutional: No apparent distress. Appropriate appearance for age. Laying in bed HENT: [place], AT Eyes: PERRLA. EOMI. Visual fields grossly intact. + nystagmus and discomfort with R gaze Cardiovascular: RRR, no murmurs/rub/gallops. Respiratory: CTAB. No RRW Abdomen:Soft, NT, ND, + BS Skin: C/D/I. No apparent lesions. Slight hyperesthesia noted with light touch of abdomen and thighs.  MSK:      No apparent deformity. L PRAFO in place   Prior exam Strength:                RUE: 5/5 SA, 5/5 EF, 5/5 EE, 5/5 WE, 5/5 FF, 5/5 FA                 LUE: 5/5 SA, 5/5 EF, 5/5 EE, 5/5 WE, 5/5 FF, 5/5 FA                 RLE: 5/5 HF, 5/5 KE, 5/5 DF, 5/5 EHL, 5/5 PF                 LLE:  2/5 HF,  1+/5 KE, 0/5 DF, 3/5 EHL, 1/5 PF  Neurologic exam: Strength able to lift bilateral upper extremity and right lower extremity to gravity, unable to lift left lower extremity to gravity, alert and awake, no dysarthria, patchy sensory loss approximately T5 and below on the left.,  Cranial nerves II through XII grossly intact, no abnormal tone   Assessment/Plan: 1. Functional deficits which require 3+ hours per day of interdisciplinary therapy in a comprehensive inpatient rehab setting. Physiatrist is providing close team supervision and 24 hour management of active medical problems listed below. Physiatrist and rehab team continue to assess barriers to discharge/monitor patient progress toward functional and medical goals  Care Tool:  Bathing    Body parts bathed by patient: Left upper leg, Right arm, Left arm, Right lower leg, Chest, Abdomen, Left lower leg,  Front perineal area, Face, Buttocks, Right upper leg         Bathing assist Assist Level: Moderate Assistance - Patient 50 - 74%     Upper Body Dressing/Undressing Upper body dressing   What is the patient wearing?: Pull over shirt    Upper body assist Assist Level: Contact Guard/Touching assist    Lower Body Dressing/Undressing Lower body dressing      What is the patient wearing?: Underwear/pull up, Pants     Lower body assist Assist for lower body dressing: Maximal Assistance - Patient 25 - 49%     Toileting Toileting    Toileting assist Assist for toileting: Maximal Assistance - Patient 25 - 49%     Transfers Chair/bed transfer  Transfers assist     Chair/bed transfer assist level: Maximal Assistance - Patient 25 - 49%     Locomotion Ambulation   Ambulation assist   Ambulation activity did not occur: Safety/medical concerns          Walk 10 feet activity   Assist  Walk 10 feet activity did not occur: Safety/medical concerns        Walk 50 feet activity   Assist Walk 50 feet with 2  turns activity did not occur: Safety/medical concerns         Walk 150 feet activity   Assist Walk 150 feet activity did not occur: Safety/medical concerns         Walk 10 feet on uneven surface  activity   Assist Walk 10 feet on uneven surfaces activity did not occur: Safety/medical concerns         Wheelchair     Assist Is the patient using a wheelchair?: Yes Type of Wheelchair: Manual (TIS at this time for safety)    Wheelchair assist level: Dependent - Patient 0%      Wheelchair 50 feet with 2 turns activity    Assist        Assist Level: Dependent - Patient 0%   Wheelchair 150 feet activity     Assist      Assist Level: Dependent - Patient 0%   Blood pressure (!) 119/93, pulse 71, temperature 97.8 F (36.6 C), resp. rate 18, height '5\' 4"'$  (1.626 m), weight 90.3 kg, SpO2 96 %.  Medical Problem List and Plan: 1. 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  Antithrombotics: -DVT/anticoagulation:  Pharmaceutical: Lovenox '40mg'$  QD             -antiplatelet therapy: none   3. Pain Management:  -continue oxycodone 10 mg 5 x daily -continue Flexeril 5-10 mg q HS -continue Voltaren gel 2g QID, Advil '400mg'$  q6h PRN and Tylenol as needed, Robaxin '500mg'$  q6h PRN -Consider adjusting oxycodone to as needed -03/16/22 pain well controlled but having hyperesthesias-- discussed letting us know if this becomes more painful, could add meds if needed   4. Mood/Behavior/Sleep: LCSW to evaluate and provide emotional support             -antipsychotic agents: n/a             -continue Lexapro '10mg'$  QHS and Elavil  $'100mg't$  QHS  -Trazodone 25-'50mg'$  QHS PRN  -03/15/22 start '6mg'$  melatonin QHS-helping 03/16/22, continue   5.  Neuropsych/cognition: This patient is capable of making decisions on her own behalf.   6. Skin/Wound Care: Routine skin care checks   7. Fluids/Electrolytes/Nutrition: Routine Is and Os and follow-up chemistries, next 03/17/22  -03/15/22 Na 134 noted 03/14/22, monitor on following labs next week  2/26 Bun up to 24, pt plans to have yeti cup brought over by family to increase fluid intake, recheck Wednesday for azotemia   8: Hypertension: monitor TID and prn             -continue lisinopril 10 mg daily  -2/26 controlled Vitals:   03/14/22 0553 03/14/22 1448 03/14/22 1946 03/15/22 0512  BP: 134/84 (!) 127/95 119/82 (!) 138/90   03/15/22 1318 03/15/22 1926 03/16/22 0539 03/16/22 0854  BP: 110/71 124/73 114/69 106/74   03/16/22 1153 03/16/22 1643 03/16/22 1950 03/17/22 0539  BP: 112/74 117/78 119/78 (!) 119/93       9: Glioblastoma s/p resection, 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:             -continue Decadron 6 mg q 6 hours -continue Keppra 1500 mg BID and Vimpat 50 mg BID for seizure ppx             - XRT x10 followed by oral chemotherapy   10: Obesity, Class III             - Complicates medical decision making; nutrition onboard   11: GI prophylaxis: Protonix '40mg'$  BID   12: Constipation vs neurogenic bowel: Given smog enema yesterday             -Senokot 2tabs QHS             -Miralax 17g BID  -PRNs: MoM, Fleet's, Sorbitol 50m -2/23 had a large bowel movement yesterday, had bowel movements the day prior also, continue to monitor -2/26 LBM yesterday night- improved  13. Urinary urgency vs. Neurogenic bladder. Continent.              - UA 2/22 negative except for small HgB  14.  Leukocytosis: 11.0 on 03/14/22 -Likely due to Decadron/hemoconcentration, no signs of infection -03/17/22 WNL WBC   15. Hyponatremia   -Start salt tabs BID, recheck wednesday  LOS: 4 days A FACE TO FACE EVALUATION WAS PERFORMED  YJennye Boroughs2/26/2024, 8:35 AM

## 2022-03-17 NOTE — Progress Notes (Signed)
Speech Language Pathology Discharge Summary  Patient Details  Name: Kelly [name] MRN: [id number] Date of Birth: 04-10-1965  Date of Discharge from [place] service:March 17, 2022  Patient has met 4 of 4 long term goals.  Patient to discharge at overall Supervision level.  Reasons goals not met: N/A   Clinical Impression/Discharge Summary: Patient has made excellent gains and has met 4 of 4 long-term goals this admission. Pt is currently at supervision for completion of functional and complex cognitive tasks in regards to problem solving, attention, memory, and anticipatory awareness. Pt feels she is at her cognitive baseline and wishes to focus rehabilitative efforts on PT/OT at this time. Education is complete and patient to discharge at overall supervision level. Patient's care partner is independent to provide the necessary physical and cognitive assistance at discharge. Patient may benefit from continued SLP services in [facility] or OP setting for further cognitive evaluation maximize cognitive function and functional independence with iADLs if indicated and pt agreeable. ST to sign off at this time.    Care Partner:  Caregiver Able to Provide Assistance: Yes  Type of Caregiver Assistance: Physical;Cognitive  Recommendation:  None;Other (comment) (May benefit from HH/OP services for further cognitive-linguistic evaluation to assess independence with iADLs if willing)      Equipment: N/A   Reasons for discharge: Treatment goals met;Other (comment) (Pt request 2/2 report of feeling at cogntive baseline. Pt at mod I/sup level. Pt to have necessary level of support at discharge)   Patient/Family Agrees with Progress Made and Goals Achieved: Yes    Patty Sermons 03/17/2022, 12:35 PM

## 2022-03-17 NOTE — Progress Notes (Signed)
Physical Therapy Session Note  Patient Details  Name: Kelly [name] MRN: [id number] Date of Birth: [birth year]  Today's Date: 03/17/2022 PT Individual Time: [id number] PT Individual Time Calculation (min): 56 min   Short Term Goals: Week 1:  PT Short Term Goal 1 (Week 1): Pt will perform STS with mod A PT Short Term Goal 2 (Week 1): Pt will perform SPT with LRAD and max A consistently PT Short Term Goal 3 (Week 1): Pt will perform bed mobility with CGA  Skilled Therapeutic Interventions/Progress Updates:     Pt received seated in WC and agrees to therapy. Reports some pain in L lower extremity, tingling and shooting in nature. PT provides repositioning and rest breaks to manage pain. Pt self propels WC x150' to gym with cues for [facility] management as well as to improve efficiency of propulsions. PT provides pt with antero-lateral strut AFO for L lower extremity, primarily for ankle stability. Pt's feet are swollen and PT has difficulty fitting L foot in shoe with AFO fully. Pt performs lateral scoot transfer from [facility] to mat table with minA and cues for sequencing and positioning. From edge of mat, pt performs multiple reps of sit to stand with minA/modA for transitional movement, and modA for static standing. PT provides manual blocking of L knee as well as facilitation of hip and trunk extension to promote upright posture and balance. Pt able to stand ~1 minute at a time, but verbalizes discomfort in L hip and knee in standing. Pt completes x3 bouts of standing with seated rest break. Pt transitions to supine with verbal cues for safe positioning. Pt completes 3x10 bridges in hooklying with PT providing manual ovrepressure at distal L thigh to promote NM activation and WB. Completed for NMR of L lower extremity as well as strengthening of core and hips. Pt performs supine to sit without assistance. Lateral transfer back to [facility] with minA. Pt left seated in WC with all needs within reach.   Therapy  Documentation Precautions:  Precautions Precautions: Fall Precaution Booklet Issued: No Precaution Comments: L LE hemplegia Restrictions Weight Bearing Restrictions: No    Therapy/Group: Individual Therapy  Breck Coons, PT, DPT 03/17/2022, 3:39 PM

## 2022-03-18 ENCOUNTER — Other Ambulatory Visit: Payer: Self-pay

## 2022-03-18 ENCOUNTER — Ambulatory Visit
Admit: 2022-03-18 | Discharge: 2022-03-18 | Disposition: A | Discharge status: Home / Self Care | Payer: Medicaid Other | Attending: Radiation Oncology | Admitting: Radiation Oncology

## 2022-03-18 DIAGNOSIS — Z510 Encounter for antineoplastic radiation therapy: Secondary | ICD-10-CM | POA: Diagnosis not present

## 2022-03-18 LAB — RAD ONC ARIA SESSION SUMMARY
Course Elapsed Days: 6
Plan Fractions Treated to Date: 5
Plan Prescribed Dose Per Fraction: 3 Gy
Plan Total Fractions Prescribed: 10
Plan Total Prescribed Dose: 30 Gy
Reference Point Dosage Given to Date: 15 Gy
Reference Point Session Dosage Given: 3 Gy
Session Number: 5

## 2022-03-18 NOTE — Progress Notes (Signed)
Physical Therapy Session Note  Patient Details  Name: Deina Brimberry MRN: [id number] Date of Birth: 03/20/1965  Today's Date: 03/18/2022 PT Individual Time:  -      Short Term Goals: Week 1:  PT Short Term Goal 1 (Week 1): Pt will perform STS with mod A PT Short Term Goal 2 (Week 1): Pt will perform SPT with LRAD and max A consistently PT Short Term Goal 3 (Week 1): Pt will perform bed mobility with CGA  Skilled Therapeutic Interventions/Progress Updates: Pt presented in bed c/o significant HA. Pt just recently received pain meds. Pt requesting to defer session to allow meds to start working. Pt missed 30 min skilled PT due to HA. Will continue efforts as schedule permits.      Therapy Documentation Precautions:  Precautions Precautions: Fall Precaution Booklet Issued: No Precaution Comments: L LE hemplegia Restrictions Weight Bearing Restrictions: No General: PT Amount of Missed Time (min): 30 Minutes PT Missed Treatment Reason: Pain;Patient fatigue Vital Signs:   Pain: Pain Assessment Pain Scale: 0-10 Pain Score: 8  Pain Type: Acute pain Pain Location: Head Pain Descriptors / Indicators: Headache Pain Frequency: Intermittent Pain Intervention(s): Medication (See eMAR)   Therapy/Group: Individual Therapy  Gustave Lindeman 03/18/2022, 9:16 AM

## 2022-03-18 NOTE — Progress Notes (Signed)
Physical Therapy Session Note  Patient Details  Name: Kelly [name] MRN: [id number] Date of Birth: 1965-08-31  Today's Date: 03/18/2022 PT Individual Time: [id number] PT Individual Time Calculation (min): 73 min   Short Term Goals: Week 1:  PT Short Term Goal 1 (Week 1): Pt will perform STS with mod A PT Short Term Goal 2 (Week 1): Pt will perform SPT with LRAD and max A consistently PT Short Term Goal 3 (Week 1): Pt will perform bed mobility with CGA  Skilled Therapeutic Interventions/Progress Updates:   First session:  Pt presents semi-reclined in bed c/o HA and just received pain meds for it.  Pt agreeable to there ex supine in bed.  Pt requests remaining in bed as will be going out of facility for radiation therapy later.  Pt performed AP (w/ manual stretch to L HC).  Pt states improved movement to LLE this AM.  Pt states numbness and "cold feeling" to R foot which is new today.  Secure chat message to Dr Dagoberto Ligas.  Pt performed HS, abd/add, SAQ, and bridging 3 x 10 w/ AAROM to LLE as well as tapping to L quads for SAQ.  Pt performed chest press w/ 4# weighted bar, triceps pressdowns and biceps curls 3 x 10-12 w/ green T-band.  T band left in room for use.  Pt then states need to use BR.  Pt requires mod A to bring LES off EOB and then min to mod A for sup to sit using siderails, HOB minimally elevated.  Pt scoots to EOB w/ min A.  Pt transfers sit to stand to [facility] w/ mod A and then handed off to NT for completed toilet training.    Second session.  Pt transported to radiation therapy before PT treatment.  Missed therapy of 45 min.      Therapy Documentation Precautions:  Precautions Precautions: Fall Precaution Booklet Issued: No Precaution Comments: L LE hemplegia Restrictions Weight Bearing Restrictions: No General: PT Amount of Missed Time (min): 45 Minutes PT Missed Treatment Reason: Out of hospital appointment (Radiation) Vital Signs: Therapy Vitals Temp: (!) 97.5 F (36.4  C) Temp Source: Oral Pulse Rate: (!) 107 Resp: 15 BP: 120/77 Patient Position (if appropriate): Lying Oxygen Therapy SpO2: 97 % O2 Device: Room Air Pain:9/10 HA      Therapy/Group: Individual Therapy  [name] 03/18/2022, 1:38 PM

## 2022-03-18 NOTE — Progress Notes (Signed)
Patient ID: Kelly [name], female   DOB: 14-Aug-1965, 57 y.o.   MRN: [id number]  SW received updates that pt radiation schedule has changed. Appt time for today is at 2:30pm. SW reviewed AVS to see all appointment changes. SW spoke with [facility] to discuss changes. Pick up time today will be at 1:30pm.   Pick up times for 2/28-3/5 is at 1:15pm for her 2:15pm appointment.    *SW met with pt in room to provide updates from team conference, and d/c date 3/6. No HHA preference. Pt aware SW will follow-up if there are any DME needs. Pt believes she will need a hospital bed, and RW. SW reiterated will confirm. PT would like SW to order chux and XL diapers.   1334-SW spoke with pt husband Tim on above. He is aware SW will follow-up with updates.   1443-SW spoke with [employer] to inquire about PCS referral. Reports an order can be faxed to [place] [facility] [phone].  SW sent out HHPT/OT/aide referral to various HHAs and waiting on follow-up.   Declined HHAs Ashley/[facility]- not in network with insurance Cheryl/[facility]- not in network Amy/[facility]- out of network Angie/[facility]- no contract Calvin/[facility]- declined Karen/[facility]- not accepting in area [facility]- declined  Waiting on follow-up from Cory/[facility] and Kelly/[facility], MSW, [place] Office: [phone] Cell: [phone] Fax: [phone]

## 2022-03-18 NOTE — Progress Notes (Signed)
Occupational Therapy Session Note  Patient Details  Name: Kelly [name] MRN: [id number] Date of Birth: October 22, 1965  Today's Date: 03/18/2022 OT Individual Time: 1130-1200 OT Individual Time Calculation (min): 30 min    Short Term Goals: Week 1:  OT Short Term Goal 1 (Week 1): Pt will complete sit > stand with mod A OT Short Term Goal 2 (Week 1): Pt will maintain dynamic sitting balance with (S) OT Short Term Goal 3 (Week 1): Pt will complete stand pivot transfer with LRAD with mod A OT Short Term Goal 4 (Week 1): Pt will maintain static standing balance during ADL with mod A with BUE support  Skilled Therapeutic Interventions/Progress Updates:    1:1 Pt received in bed with c/o HA. Pt chose to remain in the room and focus on left Le. Pt reports that her right foot has new sensations including like it is burning, cold and tingling - reported to MD. Pt performed LE exercises for strengthening, NMR in prep for transfers and sit to stands during ADLs. Pt performed hip, knee and ankle active  flexion and extension and hip adduction and abduction with support again gravity. Also performed these in sidelying. Pt with improved ability to perform hip flexion in sidelying compared to supine. Increasing fatigue in LE with exercises.   Left red theraband in her room (attached to the bed) per her request for nightly exercises.  Left in bed with bed alarm on and call bell at hand with mom in the room.    Therapy Documentation Precautions:  Precautions Precautions: Fall Precaution Booklet Issued: No Precaution Comments: L LE hemplegia Restrictions Weight Bearing Restrictions: No  Pain:   Ongoing HA and needs to rest at time due to pain; opted to remain in the bed for therapy but wanted to participate in exercises   Therapy/Group: Individual Therapy  Willeen Cass [facility] 03/18/2022, 4:20 PM

## 2022-03-18 NOTE — Progress Notes (Signed)
PROGRESS NOTE   Subjective/Complaints:  Pt reports excited - can wiggle her L foot side to side this AM- is new.  Ready for nap already- has been awake since 4am.   LBM - decent BM ~ 7+ days ago- cannot push it out.   Not incontinent of bowel.   ROS:  Pt denies SOB, abd pain, CP, N/V/ (+)C/D, and vision changes    Objective:   No results found. Recent Labs    03/17/22 0717  WBC 8.2  HGB 14.5  HCT 43.0  PLT 191    Recent Labs    03/17/22 0717  NA 132*  K 4.1  CL 100  CO2 26  GLUCOSE 168*  BUN 24*  CREATININE 0.67  CALCIUM 8.0*     Intake/Output Summary (Last 24 hours) at 03/18/2022 0824 Last data filed at 03/18/2022 0818 Gross per 24 hour  Intake 338 ml  Output 1150 ml  Net -812 ml         Physical Exam: Vital Signs Blood pressure 138/86, pulse 78, temperature 98.3 F (36.8 C), temperature source Oral, resp. rate 16, height '5\' 4"'$  (1.626 m), weight 90.3 kg, SpO2 95 %.   General: awake, alert, appropriate, sitting up in bed; NAD HENT: conjugate gaze;  R nystagmus; oropharynx moist CV: regular rate and rhythm; no JVD Pulmonary: CTA B/L; no W/R/R- good air movement GI: soft, but hypoactive- NT; somewhat distended Psychiatric: appropriate Neurological: Ox3- Can wiggle L foot side to side which is new Prior exam Strength:                RUE: 5/5 SA, 5/5 EF, 5/5 EE, 5/5 WE, 5/5 FF, 5/5 FA                 LUE: 5/5 SA, 5/5 EF, 5/5 EE, 5/5 WE, 5/5 FF, 5/5 FA                 RLE: 5/5 HF, 5/5 KE, 5/5 DF, 5/5 EHL, 5/5 PF                 LLE:  2/5 HF, 1+/5 KE, 0/5 DF, 3/5 EHL, 1/5 PF  Neurologic exam: Strength able to lift bilateral upper extremity and right lower extremity to gravity, unable to lift left lower extremity to gravity, alert and awake, no dysarthria, patchy sensory loss approximately T5 and below on the left.,  Cranial nerves II through XII grossly intact, no abnormal  tone   Assessment/Plan: 1. Functional deficits which require 3+ hours per day of interdisciplinary therapy in a comprehensive inpatient rehab setting. Physiatrist is providing close team supervision and 24 hour management of active medical problems listed below. Physiatrist and rehab team continue to assess barriers to discharge/monitor patient progress toward functional and medical goals  Care Tool:  Bathing    Body parts bathed by patient: Left upper leg, Right arm, Left arm, Right lower leg, Chest, Abdomen, Left lower leg, Front perineal area, Face, Buttocks, Right upper leg         Bathing assist Assist Level: Moderate Assistance - Patient 50 - 74%     Upper Body Dressing/Undressing Upper body dressing   What  is the patient wearing?: Pull over shirt    Upper body assist Assist Level: Contact Guard/Touching assist    Lower Body Dressing/Undressing Lower body dressing      What is the patient wearing?: Underwear/pull up, Pants     Lower body assist Assist for lower body dressing: Maximal Assistance - Patient 25 - 49%     Toileting Toileting    Toileting assist Assist for toileting: Maximal Assistance - Patient 25 - 49%     Transfers Chair/bed transfer  Transfers assist     Chair/bed transfer assist level: Maximal Assistance - Patient 25 - 49%     Locomotion Ambulation   Ambulation assist   Ambulation activity did not occur: Safety/medical concerns          Walk 10 feet activity   Assist  Walk 10 feet activity did not occur: Safety/medical concerns        Walk 50 feet activity   Assist Walk 50 feet with 2 turns activity did not occur: Safety/medical concerns         Walk 150 feet activity   Assist Walk 150 feet activity did not occur: Safety/medical concerns         Walk 10 feet on uneven surface  activity   Assist Walk 10 feet on uneven surfaces activity did not occur: Safety/medical concerns          Wheelchair     Assist Is the patient using a wheelchair?: Yes Type of Wheelchair: Manual (TIS at this time for safety)    Wheelchair assist level: Dependent - Patient 0%      Wheelchair 50 feet with 2 turns activity    Assist        Assist Level: Dependent - Patient 0%   Wheelchair 150 feet activity     Assist      Assist Level: Dependent - Patient 0%   Blood pressure 138/86, pulse 78, temperature 98.3 F (36.8 C), temperature source Oral, resp. rate 16, height '5\' 4"'$  (1.626 m), weight 90.3 kg, SpO2 95 %.  Medical Problem List and Plan: 1. 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  Antithrombotics: -DVT/anticoagulation:  Pharmaceutical: Lovenox '40mg'$  QD             -antiplatelet therapy: none   3. Pain Management:  -continue oxycodone 10 mg 5 x daily -continue Flexeril 5-10 mg q HS -continue Voltaren gel 2g QID, Advil '400mg'$  q6h PRN and Tylenol as needed, Robaxin '500mg'$  q6h PRN -Consider adjusting oxycodone to as needed -03/16/22 pain well controlled but having hyperesthesias-- discussed letting us know if this becomes more painful, could add meds if needed   2/27- pain usually controlled- con't regimen 4. Mood/Behavior/Sleep: LCSW to evaluate and provide emotional support             -antipsychotic agents: n/a             -continue Lexapro '10mg'$  QHS and Elavil '100mg'$  QHS  -Trazodone 25-'50mg'$  QHS PRN  -03/15/22 start '6mg'$  melatonin QHS-helping 03/16/22, continue   2/27- still  waking up ~ 4am- but doesn't want to change meds yet 5. Neuropsych/cognition: This patient is capable of making decisions on her own behalf.   6. Skin/Wound Care:  Routine skin care checks   7. Fluids/Electrolytes/Nutrition: Routine Is and Os and follow-up chemistries, next 03/17/22  -03/15/22 Na 134 noted 03/14/22, monitor on following labs next week  2/26 Bun up to 24, pt plans to have yeti cup brought over by family to increase fluid intake, recheck Wednesday for azotemia   8: Hypertension: monitor TID and prn             -continue lisinopril 10 mg daily  2/27- BP controlled- con't regimen Vitals:   03/15/22 0512 03/15/22 1318 03/15/22 1926 03/16/22 0539  BP: (!) 138/90 110/71 124/73 114/69   03/16/22 0854 03/16/22 1153 03/16/22 1643 03/16/22 1950  BP: 106/74 112/74 117/78 119/78   03/17/22 0539 03/17/22 1412 03/17/22 1920 03/18/22 0437  BP: (!) 119/93 117/76 116/78 138/86       9: Glioblastoma s/p resection, 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:             -continue Decadron 6 mg q 6 hours -continue Keppra 1500 mg BID and Vimpat 50 mg BID for seizure ppx             - XRT x10 followed by oral chemotherapy   2/27- to start Radiation today at 2:30 per chart 10: Obesity, Class III- BMI 34             - Complicates medical decision making; nutrition onboard   11: GI prophylaxis: Protonix '40mg'$  BID   12: Constipation vs neurogenic bowel: Given smog enema yesterday             -Senokot 2tabs QHS             -Miralax 17g BID  -PRNs: MoM, Fleet's, Sorbitol 28m -2/23 had a large bowel movement yesterday, had bowel movements the day prior also, continue to monitor -2/26 LBM yesterday night- improved  2/27- says no "decent" BM in 7+ days -wants enema- will order for 4pm today- d/w nursing 13. Urinary urgency vs. Neurogenic bladder. Continent.              - UA 2/22 negative except for small HgB  14.  Leukocytosis: 11.0 on 03/14/22 -Likely due to Decadron/hemoconcentration, no signs of infection -03/17/22 WNL WBC   15. Hyponatremia   -Start salt tabs BID, recheck Wednesday   I spent a total of 41   minutes on total care today- >50% coordination of care- due to   D/w SW about radiation timing; as well as team conference to determine length of stay  LOS: 5 days A FACE TO FACE EVALUATION WAS PERFORMED  Dontrel Smethers 03/18/2022, 8:24 AM

## 2022-03-18 NOTE — Patient Care Conference (Signed)
Inpatient RehabilitationTeam Conference and Plan of Care Update Date: 03/18/2022   Time: 11:11    Patient Name: Kelly [name]      Medical Record Number: [id number]  Date of Birth: 1965/04/27 Sex: Female         Room/Bed: 4W09C/4W09C-01 Payor Info: Payor: [place] / Plan: [place] / Product Type: *No Product type* /    Admit Date/Time:  03/13/2022  4:29 PM  Primary Diagnosis:  Thoracic spinal cord injury, sequela [facility])  Hospital Problems: Principal Problem:   Thoracic spinal cord injury, sequela ([place]) Active Problems:   Glioblastoma multiforme [facility])    Expected Discharge Date: Expected Discharge Date: 03/26/22  Team Members Present: Physician leading conference: Dr. Courtney Heys Social Worker Present: Loralee Pacas, [place] Nurse Present: Tacy Learn, RN PT Present: Verl Dicker, PT OT Present: Willeen Cass, OT PPS Coordinator present : Gunnar Fusi, SLP     Current Status/Progress Goal Weekly Team Focus  Bowel/Bladder   Pt is continent of bowel/bladder   Pt will remain continent of bowel/bladder   Will assess qshift and PRN    Swallow/Nutrition/ Hydration               ADL's   min A for LB dressing, min to mod A for transfers squat pivot; left LE begining to have mm activity   min A   fam education, functional transfers anad stand pivots, NMR for left LE    Mobility   min bed mobility, mod squat pivot, min STS with RW   min A  AFO, transfers, gait, d/c planning    Communication                Safety/Cognition/ Behavioral Observations               Pain   Pt denies pain   Pt will continue to deny pain   Will assess qshift and PRN    Skin   Pt's skin is intact   Pt's skin will remain intact  Will assess qshift and PRN      Discharge Planning:  D/c to home with her husband  who will provide support, and PRN supoprt from daughter. Pt remains on radiation treatment through 3/5. SW will  confirm there are no barriers to discharge.   Team Discussion: Thoracic spinal cord injury. Continent B/B with constipation. Enema ordered at 1600 today. Pain managed with PRN medications. Skin is CDI. Labs tomorrow. Salt tabs started. Radiation completes next week. Chemo to start once XRT completed. Will discharge at w/c level.  Patient on target to meet rehab goals: yes, approaching goal level   *See Care Plan and progress notes for long and short-term goals.   Revisions to Treatment Plan:  Medication adjustments, monitoring labs  Teaching Needs: Medications, safety, self care, skin care, transfer training, etc  Current Barriers to Discharge: Decreased caregiver support, Weight, and Weight bearing restrictions  Possible Resolutions to Barriers: Family education, nursing education, order recommended DME     Medical Summary Current Status: usually L knee and leg pain; L AFO- skin intact- biggest issue is constipation-  Barriers to Discharge: Pending chemo/radiation;Behavior/Mood;Self-care education;Weight bearing restrictions;Neurogenic Bowel & Bladder;Electrolyte abnormality;Inadequate Nutritional Intake;Renal Insufficiency/Failure;Medical stability;Morbid Obesity  Barriers to Discharge Comments: home with husband- barrier is XRT-HA's due to glioblastoma and lack of BM's. Possible Resolutions to [employer]: goals min A-buckles badly on LLE-to try AFO with new shoes;barrier- will get enema today to get cleaned out-  also likely cannot  go home ambulating with family-  3/6 d/c for now   Continued Need for Acute Rehabilitation Level of Care: The patient requires daily medical management by a physician with specialized training in physical medicine and rehabilitation for the following reasons: Direction of a multidisciplinary physical rehabilitation program to maximize functional independence : Yes Medical management of patient stability for increased activity during participation  in an intensive rehabilitation regime.: Yes Analysis of laboratory values and/or radiology reports with any subsequent need for medication adjustment and/or medical intervention. : Yes   I attest that I was present, lead the team conference, and concur with the assessment and plan of the team.   Ernest Pine 03/18/2022, 3:12 PM

## 2022-03-19 ENCOUNTER — Other Ambulatory Visit: Payer: Self-pay

## 2022-03-19 ENCOUNTER — Ambulatory Visit
Admit: 2022-03-19 | Discharge: 2022-03-19 | Disposition: A | Discharge status: Home / Self Care | Payer: Medicaid Other | Attending: Radiation Oncology | Admitting: Radiation Oncology

## 2022-03-19 DIAGNOSIS — Z510 Encounter for antineoplastic radiation therapy: Secondary | ICD-10-CM | POA: Diagnosis not present

## 2022-03-19 LAB — RAD ONC ARIA SESSION SUMMARY
Course Elapsed Days: 7
Plan Fractions Treated to Date: 6
Plan Prescribed Dose Per Fraction: 3 Gy
Plan Total Fractions Prescribed: 10
Plan Total Prescribed Dose: 30 Gy
Reference Point Dosage Given to Date: 18 Gy
Reference Point Session Dosage Given: 3 Gy
Session Number: 6

## 2022-03-19 LAB — BASIC METABOLIC PANEL
Anion gap: 11 (ref 5–15)
BUN: 17 mg/dL (ref 6–20)
CO2: 27 mmol/L (ref 22–32)
Calcium: 8.8 mg/dL — ABNORMAL LOW (ref 8.9–10.3)
Chloride: 96 mmol/L — ABNORMAL LOW (ref 98–111)
Creatinine, Ser: 0.63 mg/dL (ref 0.44–1.00)
GFR, Estimated: >60 mL/min (ref >60)
Glucose, Bld: 100 mg/dL — ABNORMAL HIGH (ref 70–99)
Potassium: 5 mmol/L (ref 3.5–5.1)
Sodium: 134 mmol/L — ABNORMAL LOW (ref 135–145)

## 2022-03-19 MED ORDER — GABAPENTIN 300 MG PO CAPS
300.0000 mg | ORAL_CAPSULE | Freq: Every day | ORAL | Status: DC
  Administered 2022-03-19 – 2022-03-20 (×2): 300 mg via ORAL
  Filled 2022-03-19 (×2): qty 1

## 2022-03-19 NOTE — Progress Notes (Addendum)
Occupational Therapy Session Note  Patient Details  Name: Kelly [name] MRN: [id number] Date of Birth: 1965-02-08  Today's Date: 03/19/2022 OT Individual Time: 1st session [id number], 2nd session 1000-1100 OT Individual Time Calculation (min): 45 min, 60 min    Short Term Goals: Week 1:  OT Short Term Goal 1 (Week 1): Pt will complete sit > stand with mod A OT Short Term Goal 2 (Week 1): Pt will maintain dynamic sitting balance with (S) OT Short Term Goal 3 (Week 1): Pt will complete stand pivot transfer with LRAD with mod A OT Short Term Goal 4 (Week 1): Pt will maintain static standing balance during ADL with mod A with BUE support  Skilled Therapeutic Interventions/Progress Updates:    1st Session: Pt in bed upon OT arrival. Reports she will be having an enema today if no BM. OT offered toileting and pt agreeable. Pt reported "my feet feel like they are in snow". MD and RN secure chat to request TED hose trial and ok'd by MD and MD reports adding Gabapentin to regimen today/ Pain reported 5/10 B LE's overall. OT applied knee high TED hose and slipper socks as well as PRAFO to L LE in prep for transfer to STEDY for toilet access in bathroom, CGA for supine to sit EOB using bed rails and features, Sit to/from stand ibn STEDY from bed and toilet with CGA but needed device for 1 person assist safety and cues to remind of L PRAFO need and positioning L LE. OT will provided transfer trng 2nd session without STEDY but pt preferred for time. Pt able to void and complete BM on regular toilet with grab bars (see Flowsheet for data) with min A buttocks hygiene S peri hygiene. Pt transferred back to bed via STEDY and required min-mod A for L LE. Left bed level with needs, bed exit and call button in reach.    2nd Session:  Pt seen for 2nd OT session. [place] nurse aware of large BM with OT previous visit to determine if enema needed. Pt open to all activity OOB for self care sink side and pt requested  to use toilet again. Transfer this session supine to EOB with CGA, lateral transfer modified squat pivot to R side with min A with arm rest of w/c removed. Slipper socks only with min cues to maintain L foot positioned correctly. Pt self propelled into bathroom with S, set up w/c with R sided transfer laterally with grab bar and squat pivot with min A. Voided on regular toilet and completed peri hygiene with set up (see Flowsheet for data). Transfer to L side with lateral modified squat pivot with mod A. LB dressing pull on pants and L AFO and tennis shoes with B leg cross up techniques but max A still due to tight pants and tennis shoe with AFO on L. Sink side UB sponge bathing, grooming and UB simple dressing with set up only. Pt remained in w/c with chair alarm active, needs and nurse call button in reach.     Therapy Documentation Precautions:  Precautions Precautions: Fall Precaution Booklet Issued: No Precaution Comments: L LE hemplegia Restrictions Weight Bearing Restrictions: No    Therapy/Group: Individual Therapy  Barnabas Lister 03/19/2022, 7:58 AM

## 2022-03-19 NOTE — Progress Notes (Signed)
PROGRESS NOTE   Subjective/Complaints:  Pt reports having cold feeling and numbness to R foot- not cold to touch.  Also has some aspects of burning.  Voltaren helped only slightly.   LBM yesterday- can't tell how much she goes, but not incontinent per pt-   Had 2 more BM's today- so doesn't want enema.   ROS:  Pt denies SOB, abd pain, CP, N/V/C/D, and vision changes   Objective:   No results found. Recent Labs    03/17/22 0717  WBC 8.2  HGB 14.5  HCT 43.0  PLT 191   Recent Labs    03/17/22 0717 03/19/22 0650  NA 132* 134*  K 4.1 5.0  CL 100 96*  CO2 26 27  GLUCOSE 168* 100*  BUN 24* 17  CREATININE 0.67 0.63  CALCIUM 8.0* 8.8*    Intake/Output Summary (Last 24 hours) at 03/19/2022 1608 Last data filed at 03/19/2022 1000 Gross per 24 hour  Intake 70 ml  Output 1325 ml  Net -1255 ml        Physical Exam: Vital Signs Blood pressure 124/85, pulse 82, temperature 97.7 F (36.5 C), temperature source Oral, resp. rate 18, height '5\' 4"'$  (1.626 m), weight 90.3 kg, SpO2 96 %.    General: awake, alert, appropriate, sitting up in bed; NAD HENT: conjugate gaze; oropharynx moist CV: regular rate and rhythm; no JVD Pulmonary: CTA B/L; no W/R/R- good air movement GI: soft, NT, ND, (+)BS- hypoactive Psychiatric: appropriate Neurological: Ox3- decreased to light touch on R foot- which is new per pt Prior exam Strength:                RUE: 5/5 SA, 5/5 EF, 5/5 EE, 5/5 WE, 5/5 FF, 5/5 FA                 LUE: 5/5 SA, 5/5 EF, 5/5 EE, 5/5 WE, 5/5 FF, 5/5 FA                 RLE: 5/5 HF, 5/5 KE, 5/5 DF, 5/5 EHL, 5/5 PF                 LLE:  2/5 HF, 1+/5 KE, 0/5 DF, 3/5 EHL, 1/5 PF  Neurologic exam: Strength able to lift bilateral upper extremity and right lower extremity to gravity, unable to lift left lower extremity to gravity, alert and awake, no dysarthria, patchy sensory loss approximately T5 and below on the  left.,  Cranial nerves II through XII grossly intact, no abnormal tone   Assessment/Plan: 1. Functional deficits which require 3+ hours per day of interdisciplinary therapy in a comprehensive inpatient rehab setting. Physiatrist is providing close team supervision and 24 hour management of active medical problems listed below. Physiatrist and rehab team continue to assess barriers to discharge/monitor patient progress toward functional and medical goals  Care Tool:  Bathing    Body parts bathed by patient: Left upper leg, Right arm, Left arm, Right lower leg, Chest, Abdomen, Left lower leg, Front perineal area, Face, Buttocks, Right upper leg         Bathing assist Assist Level: Moderate Assistance - Patient 50 - 74%  Upper Body Dressing/Undressing Upper body dressing   What is the patient wearing?: Pull over shirt    Upper body assist Assist Level: Contact Guard/Touching assist    Lower Body Dressing/Undressing Lower body dressing      What is the patient wearing?: Underwear/pull up, Pants     Lower body assist Assist for lower body dressing: Moderate Assistance - Patient 50 - 74%     Toileting Toileting    Toileting assist Assist for toileting: Moderate Assistance - Patient 50 - 74%     Transfers Chair/bed transfer  Transfers assist     Chair/bed transfer assist level: Minimal Assistance - Patient > 75%     Locomotion Ambulation   Ambulation assist   Ambulation activity did not occur: Safety/medical concerns          Walk 10 feet activity   Assist  Walk 10 feet activity did not occur: Safety/medical concerns        Walk 50 feet activity   Assist Walk 50 feet with 2 turns activity did not occur: Safety/medical concerns         Walk 150 feet activity   Assist Walk 150 feet activity did not occur: Safety/medical concerns         Walk 10 feet on uneven surface  activity   Assist Walk 10 feet on uneven surfaces activity did  not occur: Safety/medical concerns         Wheelchair     Assist Is the patient using a wheelchair?: Yes Type of Wheelchair: Manual    Wheelchair assist level: Supervision/Verbal cueing Max wheelchair distance: 75    Wheelchair 50 feet with 2 turns activity    Assist        Assist Level: Supervision/Verbal cueing   Wheelchair 150 feet activity     Assist      Assist Level: Moderate Assistance - Patient 50 - 74%   Blood pressure 124/85, pulse 82, temperature 97.7 F (36.5 C), temperature source Oral, resp. rate 18, height '5\' 4"'$  (1.626 m), weight 90.3 kg, SpO2 96 %.  Medical Problem List and Plan: 1. 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  Antithrombotics: -DVT/anticoagulation:  Pharmaceutical: Lovenox '40mg'$  QD             -antiplatelet therapy: none   3. Pain Management:  -continue oxycodone 10 mg 5 x daily -continue Flexeril 5-10 mg q HS -continue Voltaren gel 2g QID, Advil '400mg'$  q6h PRN and Tylenol as needed, Robaxin '500mg'$  q6h PRN -Consider adjusting oxycodone to as needed -03/16/22 pain well controlled but having hyperesthesias-- discussed letting us know if this becomes more painful, could add meds if needed   2/28  will add gabapentin 300 mg QHS and increase again on Friday to TID 4. Mood/Behavior/Sleep: LCSW to evaluate and provide emotional support             -antipsychotic agents: n/a             -continue Lexapro '10mg'$  QHS and Elavil '100mg'$  QHS  -Trazodone 25-'50mg'$  QHS  PRN  -03/15/22 start '6mg'$  melatonin QHS-helping 03/16/22, continue   2/27- still waking up ~ 4am- but doesn't want to change meds yet 5. Neuropsych/cognition:  This patient is capable of making decisions on her own behalf.   6. Skin/Wound Care: Routine skin care checks   7. Fluids/Electrolytes/Nutrition: Routine Is and Os and follow-up chemistries, next 03/17/22  -03/15/22 Na 134 noted 03/14/22, monitor on following labs next week  2/26 Bun up to 24, pt plans to have yeti cup brought over by family to increase fluid intake, recheck Wednesday for azotemia   2.28- BUN down to 17 from 24- will con't to monitor weekly.  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:             -continue Decadron 6 mg q 6 hours -continue Keppra 1500 mg BID and Vimpat 50 mg BID for seizure ppx             - XRT x10 followed by oral chemotherapy   2/27- to start Radiation today at 2:30 per chart  2/28 no change to Decadron dosing 10: Obesity, Class III- BMI 34             - Complicates medical decision making; nutrition onboard   11: GI prophylaxis: Protonix '40mg'$  BID   12: Constipation vs neurogenic bowel: Given smog enema yesterday             -Senokot 2tabs QHS             -Miralax 17g BID  -PRNs: MoM, Fleet's, Sorbitol 90m -2/23 had a large bowel movement yesterday, had bowel movements the day prior also, continue to monitor -2/26 LBM yesterday night- improved  2/27- says no "decent" BM in 7+ days -wants enema- will order for 4pm today- d/w nursing  2/28- didn't get enema- timing- but had 1 BM yesterday as well as 2 medium BM's this AM- doesn't want enema anymore.  13. Urinary urgency vs. Neurogenic bladder. Continent.              - UA 2/22 negative  except for small HgB  14.  Leukocytosis: 11.0 on 03/14/22 -Likely due to Decadron/hemoconcentration, no signs of infection -03/17/22 WNL WBC   15. Hyponatremia   -Start salt tabs BID, recheck Wednesday  2/28- Na 134- doing better- con't regimen for now   I spent a total of 36   minutes on total care today- >50% coordination of care- due to  D/w pt about nerve pain- as well as OT about LE Sx's- and adding TEDs; also review and acting on labs.   LOS: 6 days A FACE TO FACE EVALUATION WAS PERFORMED  Kelly [name] 03/19/2022, 4:08 PM

## 2022-03-19 NOTE — Progress Notes (Addendum)
Patient ID: Jelecia Shavers, female   DOB: 1965-07-11, 57 y.o.   MRN: [id number]  Kelly/[facility] declined referral due to staffing.   SW spoke with Dorian Pod.Intake with [facility] (p:[phone]/f:[phone]) to discuss referral and not in contract with insurance.   SW faxed PCS referral to LTSS Dept with [facility] [place] [phone].  *No HHA at this time. SW will explore again next week to see if there are any changes.   SW received fax confirming LTSS referral received and will follow-up with patient within 5 business day.   Declined HHAs Ashley/[facility]- not in network with insurance Cheryl/[facility]- not in network Amy/[facility]- out of network Angie/[facility]- no contract Calvin/[facility]- declined Karen/[facility] Int[facility] with [place]- not accepting in area [facility] with [facility]- declined [place], MSW, [place] Office: [phone] Cell: [phone] Fax: [phone]

## 2022-03-19 NOTE — Progress Notes (Signed)
Physical Therapy Session Note  Patient Details  Name: Kelly [name] MRN: [id number] Date of Birth: 02-14-1965  Today's Date: 03/19/2022 PT Individual Time: [id number] and 1116-1200 PT Individual Time Calculation (min): 45 min and 44 min.  Short Term Goals: Week 1:  PT Short Term Goal 1 (Week 1): Pt will perform STS with mod A PT Short Term Goal 2 (Week 1): Pt will perform SPT with LRAD and max A consistently PT Short Term Goal 3 (Week 1): Pt will perform bed mobility with CGA  Skilled Therapeutic Interventions/Progress Updates:   First session:  Pt presents semi-reclined in bed and agreeable to therapy.  Leg loops retrieved and educated pt on use.  Pt performed LE there ex including AP including stretches to L HC and then resisted PF, HS, including R sidelying L hip flexion, bridging w/ improved glut activation LLE.  Pt remained supine in bed and bed alarm on and all needs in reach.    Second session:  Pt presents sitting in w/c and agreeable to therapy.  Pt wheeled self x 75' w/ B UES and supervision.  PT completed distance to main gym.  Pt performed lateral scoot/squat pivot w/c > mat to right w/ min A and occasional verbal cues.  Pt sat EOB for throwing/bouncing and catching yellow T-ball, chest pass, overhead pass.  Pt transferred sit to stand w/ RW and mod A and blocking of L knee.  Pt stood x 2' each trial w/ weight shift and verbal/tactile cues for posture.  Pt performed lateral scoot to L w/ mod A.  Discussed safety w/ change in direction of transfers.  Pt returned to room and performed squat pivot transfer w/c > bed to R w/ min A.  Pt required min A for LLE into bed and then scoots self to center of bed.  Bed alarm on and all needs in reach.     Therapy Documentation Precautions:  Precautions Precautions: Fall Precaution Booklet Issued: No Precaution Comments: L LE hemplegia Restrictions Weight Bearing Restrictions: No General:   Vital Signs:   Pain:6/10        Therapy/Group: Individual Therapy  [name] 03/19/2022, 9:48 AM

## 2022-03-20 ENCOUNTER — Ambulatory Visit
Admit: 2022-03-20 | Discharge: 2022-03-20 | Disposition: A | Discharge status: Home / Self Care | Payer: Medicaid Other | Attending: Radiation Oncology | Admitting: Radiation Oncology

## 2022-03-20 ENCOUNTER — Other Ambulatory Visit: Payer: Self-pay

## 2022-03-20 DIAGNOSIS — Z510 Encounter for antineoplastic radiation therapy: Secondary | ICD-10-CM | POA: Diagnosis not present

## 2022-03-20 LAB — RAD ONC ARIA SESSION SUMMARY
Course Elapsed Days: 8
Plan Fractions Treated to Date: 7
Plan Prescribed Dose Per Fraction: 3 Gy
Plan Total Fractions Prescribed: 10
Plan Total Prescribed Dose: 30 Gy
Reference Point Dosage Given to Date: 21 Gy
Reference Point Session Dosage Given: 3 Gy
Session Number: 7

## 2022-03-20 NOTE — Progress Notes (Signed)
Occupational Therapy Session Note  Patient Details  Name: Kelly [name] MRN: [id number] Date of Birth: [birth year]  Today's Date: 03/20/2022 OT Individual Time: [id number] Individual Time Calculation (min): 70 min    Short Term Goals: Week 1:  OT Short Term Goal 1 (Week 1): Pt will complete sit > stand with mod A OT Short Term Goal 2 (Week 1): Pt will maintain dynamic sitting balance with (S) OT Short Term Goal 3 (Week 1): Pt will complete stand pivot transfer with LRAD with mod A OT Short Term Goal 4 (Week 1): Pt will maintain static standing balance during ADL with mod A with BUE support  Skilled Therapeutic Interventions/Progress Updates:    Pt resting in w/c upon arrival with husband present. Pt reports that she is slightly fatigued following earlier therapy. OT intervention with focus on squat pivot transfers, TTB transfers, educaiton, and discharge planning. Pt's husband given Home Measurement Sheet. Pt will require TTB and DABSC. Practiced TTB transfer. Pt equired mod A for transfer and assistance lifting LLE in/out of tub. Pt returned to room and remained in w/c with belt alarm activated. All needs within reach and husband present.   Therapy Documentation Precautions:  Precautions Precautions: Fall Precaution Booklet Issued: No Precaution Comments: L LE hemplegia Restrictions Weight Bearing Restrictions: No Pain: Pt denies pain this morning   Therapy/Group: Individual Therapy  Leroy Libman 03/20/2022, 11:57 AM

## 2022-03-20 NOTE — Progress Notes (Signed)
Occupational Therapy Note  Patient Details  Name: Sanaz Kuchinsky MRN: [id number] Date of Birth: 04-25-1965  Today's Date: 03/20/2022 OT Missed Time: 28 Minutes Missed Time Reason: Other (comment) (mixup with breakfast; pt wanted to eat first)  Pt resting in bed upon arrival. Pt had not received breakfast yet. OTA checked on tray and it was determined that breakfast order was not placed since pt was at radiation. Pt wanted to eat breakfast before therapy. Pt called in order and was awaiting delivery. Pt missed 75 mins skilled OT services. Will attempt to see pt as schedule allows.    Le[facility]is [facility] 03/20/2022, 7:42 AM

## 2022-03-20 NOTE — Progress Notes (Addendum)
Patient ID: Kelly [name], female   DOB: [birth year], 57 y.o.   MRN: [id number]  SW received updates from Cory[facility] that  HHPT/OT/aide referral was accepted.   SW informed DME needed TTB and DABSC.   SW discussed with patient, pt husband, and parents in room. Pt aware SW will order above DME items. She asks about a hospital bed and incontinence supplies. SW shared will discuss with medical team and will follow-up.   SW ordered DME items above with [facility] [employer] via parachute.   Loralee Pacas, MSW, [place] Office: [phone] Cell: [phone] Fax: [phone]

## 2022-03-20 NOTE — Progress Notes (Signed)
Pt had an assisted fall with Kelly [name] (NT.) Pt A&O x4, 0 out of 10 pain.   Kelly [name]

## 2022-03-20 NOTE — Progress Notes (Signed)
PROGRESS NOTE   Subjective/Complaints:   Pt reports has a little redness on buttocks- asking for Rx when goes home for "dermacloud" which is a generic OTC cold cream based skin protectant.   Hasn't received breakfast yet.  L foot moving better Gabapentin was more/somewhat helpful last night in addition to voltaren gel- No side effects so far.  Has has to pas gas, is when she has BM- no accidents- knows when needs to have BM, but buttocks numb, so it complicates BM's.   ROS:  Pt denies SOB, abd pain, CP, N/V/C/D, and vision changes Except for HPI  Objective:   No results found. No results for input(s): "WBC", "HGB", "HCT", "PLT" in the last 72 hours.  Recent Labs    03/19/22 0650  NA 134*  K 5.0  CL 96*  CO2 27  GLUCOSE 100*  BUN 17  CREATININE 0.63  CALCIUM 8.8*    Intake/Output Summary (Last 24 hours) at 03/20/2022 [id number] Last data filed at 03/20/2022 0405 Gross per 24 hour  Intake 240 ml  Output 1375 ml  Net -1135 ml        Physical Exam: Vital Signs Blood pressure 117/80, pulse 91, temperature 97.7 F (36.5 C), resp. rate 18, height '5\' 4"'$  (1.626 m), weight 90.3 kg, SpO2 98 %.     General: awake, alert, appropriate, sitting up in bed;  NAD HENT: conjugate gaze; oropharynx moist CV: regular rate and rhythm; no JVD Pulmonary: CTA B/L; no W/R/R- good air movement GI: soft, NT, ND, (+)BS- normoactive Psychiatric: appropriate Neurological: Ox3 - decreased to light touch on R foot- Can now do DF 2-/5 and PF 2-/5 on L foot Prior exam Strength:                RUE: 5/5 SA, 5/5 EF, 5/5 EE, 5/5 WE, 5/5 FF, 5/5 FA                 LUE: 5/5 SA, 5/5 EF, 5/5 EE, 5/5 WE, 5/5 FF, 5/5 FA                 RLE: 5/5 HF, 5/5 KE, 5/5 DF, 5/5 EHL, 5/5 PF                 LLE:  2/5 HF, 1+/5 KE, 0/5 DF, 3/5 EHL, 1/5 PF  Neurologic exam: Strength able to lift bilateral upper extremity and right lower extremity to gravity,  unable to lift left lower extremity to gravity, alert and awake, no dysarthria, patchy sensory loss approximately T5 and below on the left.,  Cranial nerves II through XII grossly intact, no abnormal tone   Assessment/Plan: 1. Functional deficits which require 3+ hours per day of interdisciplinary therapy in a comprehensive inpatient rehab setting. Physiatrist is providing close team supervision and 24 hour management of active medical problems listed below. Physiatrist and rehab team continue to assess barriers to discharge/monitor patient progress toward functional and medical goals  Care Tool:  Bathing    Body parts bathed by patient: Left upper leg, Right arm, Left arm, Right lower leg, Chest, Abdomen, Left lower leg, Front perineal area, Face, Buttocks, Right upper leg  Bathing assist Assist Level: Moderate Assistance - Patient 50 - 74%     Upper Body Dressing/Undressing Upper body dressing   What is the patient wearing?: Pull over shirt    Upper body assist Assist Level: Contact Guard/Touching assist    Lower Body Dressing/Undressing Lower body dressing      What is the patient wearing?: Underwear/pull up, Pants     Lower body assist Assist for lower body dressing: Moderate Assistance - Patient 50 - 74%     Toileting Toileting    Toileting assist Assist for toileting: Moderate Assistance - Patient 50 - 74%     Transfers Chair/bed transfer  Transfers assist     Chair/bed transfer assist level: Minimal Assistance - Patient > 75%     Locomotion Ambulation   Ambulation assist   Ambulation activity did not occur: Safety/medical concerns          Walk 10 feet activity   Assist  Walk 10 feet activity did not occur: Safety/medical concerns        Walk 50 feet activity   Assist Walk 50 feet with 2 turns activity did not occur: Safety/medical concerns         Walk 150 feet activity   Assist Walk 150 feet activity did not occur:  Safety/medical concerns         Walk 10 feet on uneven surface  activity   Assist Walk 10 feet on uneven surfaces activity did not occur: Safety/medical concerns         Wheelchair     Assist Is the patient using a wheelchair?: Yes Type of Wheelchair: Manual    Wheelchair assist level: Supervision/Verbal cueing Max wheelchair distance: 75    Wheelchair 50 feet with 2 turns activity    Assist        Assist Level: Supervision/Verbal cueing   Wheelchair 150 feet activity     Assist      Assist Level: Moderate Assistance - Patient 50 - 74%   Blood pressure 117/80, pulse 91, temperature 97.7 F (36.5 C), resp. rate 18, height '5\' 4"'$  (1.626 m), weight 90.3 kg, SpO2 98 %.  Medical Problem List and Plan: 1. 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  Antithrombotics: -DVT/anticoagulation:  Pharmaceutical: Lovenox '40mg'$  QD             -antiplatelet therapy: none   3. Pain Management:  -continue oxycodone 10 mg 5 x daily -continue Flexeril 5-10 mg q HS -continue Voltaren gel 2g QID, Advil '400mg'$  q6h PRN and Tylenol as needed, Robaxin '500mg'$  q6h PRN -Consider adjusting oxycodone to as needed   2/28  will add gabapentin 300 mg QHS and increase again on Friday to TID  2/29- tolerating well- no side effects 4. Mood/Behavior/Sleep: LCSW to evaluate and provide emotional support             -antipsychotic agents: n/a             -continue Lexapro '10mg'$  QHS and Elavil '100mg'$  QHS  -Trazodone 25-'50mg'$  QHS PRN  -03/15/22 start '6mg'$   melatonin QHS-helping 03/16/22, continue   2/27- still waking up ~ 4am- but doesn't want to change meds yet  2/29- waking up early, but no med changes  per pt.  5. Neuropsych/cognition: This patient is capable of making decisions on her own behalf.   6. Skin/Wound Care: Routine skin care checks   7. Fluids/Electrolytes/Nutrition: Routine Is and Os and follow-up chemistries, next 03/17/22  -03/15/22 Na 134 noted 03/14/22, monitor on following labs next week  2/26 Bun up to 24, pt plans to have yeti cup brought over by family to increase fluid intake, recheck Wednesday for azotemia   2.28- BUN down to 17 from 24- will con't to monitor weekly.  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:             -continue Decadron 6 mg q 6 hours -continue Keppra 1500 mg BID and Vimpat 50 mg BID for seizure ppx             - XRT x10 followed by oral chemotherapy   2/27- to start Radiation today at 2:30 per chart  2/28 no change to Decadron dosing 10: Obesity, Class III- BMI 34             - Complicates medical decision making; nutrition onboard   11: GI prophylaxis: Protonix '40mg'$  BID   12: Constipation vs neurogenic bowel: Given smog enema yesterday             -Senokot 2tabs QHS             -Miralax 17g BID  -PRNs: MoM, Fleet's, Sorbitol 42m -2/23 had a large bowel movement yesterday, had bowel movements the day prior also, continue to monitor -2/26 LBM yesterday night- improved  2/27- says no "decent" BM in 7+ days -wants enema- will order for 4pm today- d/w nursing  2/28- didn't get enema- timing- but had 1 BM yesterday as well as 2 medium BM's this AM- doesn't want enema anymore.   2/29- 3 Bms yesterday- doesn't feel constipated  anymore.  13. Urinary urgency vs. Neurogenic bladder. Continent.              - UA 2/22 negative except for small HgB  2/29- going well- no inconitnence and retaining small amounts, but not enough to need cathing 14.  Leukocytosis: 11.0 on 03/14/22 -Likely due to Decadron/hemoconcentration, no signs of infection -03/17/22 WNL WBC   15. Hyponatremia   -Start salt tabs BID, recheck Wednesday  2/28- Na 134- doing better- con't regimen for now   I spent a total of 35   minutes on total care today- >50% coordination of care- due to  D/w PA and team about Dermacloud that pt asking for Rx at d/c- is OTC and generic- doesn't require an Rx.   LOS: 7 days A FACE TO FACE EVALUATION WAS PERFORMED  Kelly [name] 03/20/2022, 8:32 AM

## 2022-03-20 NOTE — Progress Notes (Signed)
Physical Therapy Session Note  Patient Details  Name: Kelly [name] MRN: [id number] Date of Birth: 1965-12-21  Today's Date: 03/20/2022 PT Individual Time: 0900-1000 PT Individual Time Calculation (min): 60 min   Short Term Goals: Week 1:  PT Short Term Goal 1 (Week 1): Pt will perform STS with mod A PT Short Term Goal 2 (Week 1): Pt will perform SPT with LRAD and max A consistently PT Short Term Goal 3 (Week 1): Pt will perform bed mobility with CGA  Skilled Therapeutic Interventions/Progress Updates:    Pt recd in bed with her husband assisting with oral hygiene. Therapist assisted with donning AFO and L leg loop. Bed mobility with CGA. Squat pivot with min A. Pt's husband present for family education, session focused on transfer practice. Attempted Stand pivot transfer with RW, pt able to stand with mod A, but unable to achieve full hip/knee extension. Pt was unable to lift either LE, so discontinued transfer for safety. Pt performed squat pivot with both therapist and husband at varying heights with min A-CGA. Pt moderately impulsive at times, and requires cues for safety. Extensive instruction for pt and husband for technique and safety. Pt also performed car transfer x3. Best transfer with pt using "bear hug" technique with therapist and guarding LLE. Pt and husband will benefit from repeated training d/t impulsivity and to improve communication and confidence. Pt returned to room and remained in w/c, was left with all needs in reach and alarm active.   Therapy Documentation Precautions:  Precautions Precautions: Fall Precaution Booklet Issued: No Precaution Comments: L LE hemplegia Restrictions Weight Bearing Restrictions: No General:      Therapy/Group: Individual Therapy  Mickel Fuchs 03/20/2022, 8:16 AM

## 2022-03-21 ENCOUNTER — Other Ambulatory Visit: Payer: Self-pay

## 2022-03-21 ENCOUNTER — Ambulatory Visit
Admission: RE | Admit: 2022-03-21 | Discharge: 2022-03-21 | Disposition: A | Discharge status: Home / Self Care | Payer: Medicaid Other | Source: Ambulatory Visit | Attending: Radiation Oncology | Admitting: Radiation Oncology

## 2022-03-21 DIAGNOSIS — Z510 Encounter for antineoplastic radiation therapy: Secondary | ICD-10-CM | POA: Diagnosis not present

## 2022-03-21 DIAGNOSIS — C712 Malignant neoplasm of temporal lobe: Secondary | ICD-10-CM | POA: Insufficient documentation

## 2022-03-21 LAB — RAD ONC ARIA SESSION SUMMARY
Course Elapsed Days: 9
Plan Fractions Treated to Date: 8
Plan Prescribed Dose Per Fraction: 3 Gy
Plan Total Fractions Prescribed: 10
Plan Total Prescribed Dose: 30 Gy
Reference Point Dosage Given to Date: 24 Gy
Reference Point Session Dosage Given: 3 Gy
Session Number: 8

## 2022-03-21 MED ORDER — GABAPENTIN 300 MG PO CAPS
300.0000 mg | ORAL_CAPSULE | Freq: Three times a day (TID) | ORAL | Status: DC
  Administered 2022-03-21 – 2022-03-28 (×20): 300 mg via ORAL
  Filled 2022-03-21 (×20): qty 1

## 2022-03-21 MED ORDER — POLYETHYLENE GLYCOL 3350 17 G PO PACK: 17.0000 g | PACK | Freq: Every day | ORAL | Status: DC | PRN

## 2022-03-21 NOTE — Progress Notes (Signed)
PROGRESS NOTE   Subjective/Complaints:   Pt reports having bowel incontinence due to loose stools- has accident this AM again- thinks it's due to Miralax- will make Miralax prn.   Feet a little better last night, but still an issue. Would like/agree with increase in gabapentin today we discussed.   ROS:  Pt denies SOB, abd pain, CP, N/V/C/D, and vision changes  Except for HPI  Objective:   No results found. No results for input(s): "WBC", "HGB", "HCT", "PLT" in the last 72 hours.  Recent Labs    03/19/22 0650  NA 134*  K 5.0  CL 96*  CO2 27  GLUCOSE 100*  BUN 17  CREATININE 0.63  CALCIUM 8.8*    Intake/Output Summary (Last 24 hours) at 03/21/2022 0819 Last data filed at 03/21/2022 0800 Gross per 24 hour  Intake 840 ml  Output 400 ml  Net 440 ml        Physical Exam: Vital Signs Blood pressure 110/72, pulse 90, temperature (!) 97.5 F (36.4 C), resp. rate 18, height '5\' 4"'$  (1.626 m), weight 90.3 kg, SpO2 98 %.     General: awake, alert, appropriate, sitting on toilet with NT in room; had bowel incontinence noted; NAD HENT: conjugate gaze; oropharynx moist CV: regular rate; no JVD Pulmonary: CTA B/L; no W/R/R- good air movement GI: soft, NT, ND, (+)BS- hyperactive Psychiatric: appropriate- more sad affect today Neurological: Ox3 - decreased to light touch on R foot- Can now do DF 2-/5 and PF 2-/5 on L foot Prior exam Strength:                RUE: 5/5 SA, 5/5 EF, 5/5 EE, 5/5 WE, 5/5 FF, 5/5 FA                 LUE: 5/5 SA, 5/5 EF, 5/5 EE, 5/5 WE, 5/5 FF, 5/5 FA                 RLE: 5/5 HF, 5/5 KE, 5/5 DF, 5/5 EHL, 5/5 PF                 LLE:  2/5 HF, 1+/5 KE, 0/5 DF, 3/5 EHL, 1/5 PF  Neurologic exam: Strength able to lift bilateral upper extremity and right lower extremity to gravity, unable to lift left lower extremity to gravity, alert and awake, no dysarthria, patchy sensory loss approximately T5 and  below on the left.,  Cranial nerves II through XII grossly intact, no abnormal tone   Assessment/Plan: 1. Functional deficits which require 3+ hours per day of interdisciplinary therapy in a comprehensive inpatient rehab setting. Physiatrist is providing close team supervision and 24 hour management of active medical problems listed below. Physiatrist and rehab team continue to assess barriers to discharge/monitor patient progress toward functional and medical goals  Care Tool:  Bathing    Body parts bathed by patient: Left upper leg, Right arm, Left arm, Right lower leg, Chest, Abdomen, Left lower leg, Front perineal area, Face, Buttocks, Right upper leg         Bathing assist Assist Level: Moderate Assistance - Patient 50 - 74%     Upper Body Dressing/Undressing Upper body  dressing   What is the patient wearing?: Pull over shirt    Upper body assist Assist Level: Contact Guard/Touching assist    Lower Body Dressing/Undressing Lower body dressing      What is the patient wearing?: Underwear/pull up, Pants     Lower body assist Assist for lower body dressing: Moderate Assistance - Patient 50 - 74%     Toileting Toileting    Toileting assist Assist for toileting: Moderate Assistance - Patient 50 - 74%     Transfers Chair/bed transfer  Transfers assist     Chair/bed transfer assist level: Minimal Assistance - Patient > 75%     Locomotion Ambulation   Ambulation assist   Ambulation activity did not occur: Safety/medical concerns          Walk 10 feet activity   Assist  Walk 10 feet activity did not occur: Safety/medical concerns        Walk 50 feet activity   Assist Walk 50 feet with 2 turns activity did not occur: Safety/medical concerns         Walk 150 feet activity   Assist Walk 150 feet activity did not occur: Safety/medical concerns         Walk 10 feet on uneven surface  activity   Assist Walk 10 feet on uneven surfaces  activity did not occur: Safety/medical concerns         Wheelchair     Assist Is the patient using a wheelchair?: Yes Type of Wheelchair: Manual    Wheelchair assist level: Supervision/Verbal cueing Max wheelchair distance: 75    Wheelchair 50 feet with 2 turns activity    Assist        Assist Level: Supervision/Verbal cueing   Wheelchair 150 feet activity     Assist      Assist Level: Moderate Assistance - Patient 50 - 74%   Blood pressure 110/72, pulse 90, temperature (!) 97.5 F (36.4 C), resp. rate 18, height '5\' 4"'$  (1.626 m), weight 90.3 kg, SpO2 98 %.  Medical Problem List and Plan: 1. 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  Antithrombotics: -DVT/anticoagulation:  Pharmaceutical: Lovenox '40mg'$  QD             -antiplatelet therapy: none   3. Pain Management:  -continue oxycodone 10 mg 5 x daily -continue Flexeril 5-10 mg q HS -continue Voltaren gel 2g QID, Advil '400mg'$  q6h PRN and Tylenol as needed, Robaxin '500mg'$  q6h PRN -Consider adjusting oxycodone to as needed   2/28  will add gabapentin 300 mg QHS and increase again on Friday to TID  2/29- tolerating well- no side effects 4. Mood/Behavior/Sleep: LCSW to evaluate and provide emotional support             -antipsychotic agents: n/a             -continue Lexapro '10mg'$  QHS and Elavil '100mg'$  QHS  -Trazodone 25-'50mg'$  QHS PRN  -03/15/22 start '6mg'$  melatonin QHS-helping 03/16/22, continue   2/27- still waking up ~ 4am- but doesn't want to change meds yet  2/29- waking up early, but no med changes per pt.  5. Neuropsych/cognition: This patient is capable of making decisions on her own behalf.   6. Skin/Wound  Care: Routine skin care checks   7. Fluids/Electrolytes/Nutrition: Routine Is and Os and follow-up chemistries, next 03/17/22  -03/15/22 Na 134 noted 03/14/22, monitor on following labs next week  2/26 Bun up to 24, pt plans to have yeti cup brought over by family to increase fluid intake, recheck Wednesday for azotemia   2.28- BUN down to 17 from 24- will con't to monitor weekly.  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  10: Obesity, Class III- BMI 34             - Complicates medical decision making; nutrition onboard   11: GI prophylaxis: Protonix '40mg'$  BID   12: Constipation vs neurogenic bowel: Given smog enema yesterday             -Senokot 2tabs QHS             -Miralax 17g BID  -PRNs: [facility], [facility], Sorbitol 65m -2/23 had a large bowel movement yesterday, had bowel movements the day prior also, continue to monitor  3/1- will make Miralax prn since having a lot of bowel incontinence- due to loose stools coupled with lack of sensation. .  13. Urinary urgency vs. Neurogenic bladder. Continent.              - UA 2/22 negative except for small HgB  2/29- going well- no inconitnence and retaining small amounts, but not enough to need cathing 14.  Leukocytosis: 11.0 on 03/14/22 -Likely due to  Decadron/hemoconcentration, no signs of infection -03/17/22 WNL WBC  15. Hyponatremia   -Start salt tabs BID, recheck Wednesday  2/28- Na 134- doing better- con't regimen for now   LOS: 8 days A FACE TO FACE EVALUATION WAS PERFORMED  Kelly [name] 03/21/2022, 8:19 AM

## 2022-03-21 NOTE — Progress Notes (Signed)
Occupational Therapy Note  Patient Details  Name: Kelly [name] MRN: [id number] Date of Birth: 21-Aug-1965  Today's Date: 03/21/2022 OT Missed Time: 37 Minutes Missed Time Reason: Other (comment) (pt using bathroom and dressing)  Pt resting in bed upon arrival and just finished with breakfast. Pt requesting use of bathroom and wanted to get dressed before therapy. Pt voiced preference for female NT or therapist to assist with all ADLs. NT Kambi assisted pt with ADLs and use of bathroom. Pt missed 75 mins skilled OT services.    Leotis Shames [facility] 03/21/2022, 8:08 AM

## 2022-03-21 NOTE — Progress Notes (Signed)
Physical Therapy Session Note  Patient Details  Name: Kelly [name] MRN: [id number] Date of Birth: 01-May-1965  Today's Date: 03/21/2022 PT Individual Time: [id number] PT Individual Time Calculation (min): 51 min   Short Term Goals: Week 1:  PT Short Term Goal 1 (Week 1): Pt will perform STS with mod A PT Short Term Goal 2 (Week 1): Pt will perform SPT with LRAD and max A consistently PT Short Term Goal 3 (Week 1): Pt will perform bed mobility with CGA  Skilled Therapeutic Interventions/Progress Updates:    Patient received in room in [facility], no c/o pain or discomfort at this time and agreeable to therapy.  Pt self propelled to main gym ~130' with supervision, cues at start for set up/management of brake and assist for leg rest placement. Pt positioned WC next to mat table for squat pivot transfer, min assist to adjust WC to be slightly closer for transfer. Pt with good hand placement to direct/guide transfer, cue for head hip relationship and min assist to fully pivot to mat. Pt moved sit>supine with Min assist to bring Lt LE onto table only.   Supine TE: - 2x10 bridge with manual blocking/positioning of Lt LE to prevent hip from ER/abd. - 1x10 isometric ball squeeze with internal rotator; 1x10 AROM internal rotation on Lt LE from ~30* externally rotated to touch ball. Blocking to prevent pt's leg from dropping further to external rotation - 1x10 quad set Lt LE with soft ball behind knee for tactile cue, pt's patella noted to rise superiorly indicating trace muscle activation  Mod assist to initiate roll to Lt side on mat table and to bring LE's off edge for supine>sit. Pt able to maintain seated balance with supervision and no UE support. Seated TE: - 3kg med ball pass around waist, 5x to Rt, 5x to Lt - 10x sit up with wedge and pillow behind back, pt able to complete 2 with pillow removed but fatigued quickly, able to finish 8 reps with pillow on top of wedge.  - 10x sit up with 3kg ball toss  on rebounder with sit up between reps, wedge and pillow back  Squat pivot transfer from mat table to [facility], pt verbalized awareness of foot position to keep bil feet on flat on floor and assessed closest brake on WC to make sure it was locked prior to transfer. Min assist/CGA to pivot to WC. Pt attempted to scoot hips posteriorly for safe sitting, cue to bring armrest forward to use for repositioning and pt able to do so. Pt self propelled back to room with supervision. EOS pt returned to room and remained in [facility], alarm on, all needs and call bell within reach.    Therapy Documentation Precautions:  Precautions Precautions: Fall Precaution Booklet Issued: No Precaution Comments: L LE hemplegia Restrictions Weight Bearing Restrictions: No     Therapy/Group: Individual Therapy   Verner Mould, DPT Acute Rehabilitation Services Office [phone]  03/21/22 7:53 AM

## 2022-03-21 NOTE — Progress Notes (Signed)
Occupational Therapy Weekly Progress Note  Patient Details  Name: Kelly [name] MRN: [id number] Date of Birth: [birth year]  Beginning of progress report period: March 14, 2022 End of progress report period: March 21, 2022  Patient has met 3 of 4 short term goals.  Pt making steady progress with BADLs and squat pivot transfers. Bathing at bed level and seated in w/c at sink with min A. UB dressing with supervision. LB dressing with max A. Pt able to cross BLE in figure 4 but requires assistance pulling over hips and donning AFO and shoes. Squat pivot transfers with mod A. Pt impulsive and requires max verbal cues for safety awareness, especially during transitional movements. Pt's husband has been present for therapy and is scheduled for additional educational therapy sessions.   Patient continues to demonstrate the following deficits: muscle weakness, muscle joint tightness, and muscle paralysis, decreased cardiorespiratoy endurance, impaired timing and sequencing, abnormal tone, unbalanced muscle activation, decreased coordination, and decreased motor planning, decreased memory and delayed processing, and decreased standing balance, decreased postural control, and decreased balance strategies and therefore will continue to benefit from skilled OT intervention to enhance overall performance with BADL, iADL, and Reduce care partner burden.  Patient progressing toward long term goals..  Continue plan of care.  OT Short Term Goals Week 1:  OT Short Term Goal 1 (Week 1): Pt will complete sit > stand with mod A OT Short Term Goal 1 - Progress (Week 1): Met OT Short Term Goal 2 (Week 1): Pt will maintain dynamic sitting balance with (S) OT Short Term Goal 2 - Progress (Week 1): Met OT Short Term Goal 3 (Week 1): Pt will complete stand pivot transfer with LRAD with mod A OT Short Term Goal 3 - Progress (Week 1): Met OT Short Term Goal 4 (Week 1): Pt will maintain static standing balance during ADL  with mod A with BUE support OT Short Term Goal 4 - Progress (Week 1): Progressing toward goal Week 2:  OT Short Term Goal 1 (Week 2): STG=LTG 2/2 ELOS   Leroy Libman 03/21/2022, 6:59 AM

## 2022-03-21 NOTE — Progress Notes (Signed)
Physical Therapy Weekly Progress Note  Patient Details  Name: Kelly [name] MRN: [id number] Date of Birth: 11-Mar-1965  Beginning of progress report period: March 14, 2022 End of progress report period: March 21, 2022  Today's Date: 03/21/2022 PT Individual Time: 0845-1000 PT Individual Time Calculation (min): 75 min   Patient has met 2 of 3 short term goals.  Pt is progressing well toward d/c at w/c level. W/c eval planned for Monday 3/4. Pt requires min A-CGA for squat pivot transfers and can stand with mod A, but lacks functional strength to use for transfers. Bed mobility with CGA from flat surface without bed rails.   Patient continues to demonstrate the following deficits muscle weakness, decreased cardiorespiratoy endurance, impaired timing and sequencing, abnormal tone, decreased coordination, and decreased motor planning, and decreased sitting balance, decreased postural control, decreased balance strategies, and difficulty maintaining precautions and therefore will continue to benefit from skilled PT intervention to increase functional independence with mobility.  Patient progressing toward long term goals..  Plan of care revisions: upgraded transfer goals.  PT Short Term Goals Week 1:  PT Short Term Goal 1 (Week 1): Pt will perform STS with mod A PT Short Term Goal 1 - Progress (Week 1): Met PT Short Term Goal 2 (Week 1): Pt will perform SPT with LRAD and max A consistently PT Short Term Goal 2 - Progress (Week 1): Discontinued (comment) (focusing on squat pivot d/t lack of functional stand) PT Short Term Goal 3 (Week 1): Pt will perform bed mobility with CGA PT Short Term Goal 3 - Progress (Week 1): Met Week 2:  PT Short Term Goal 1 (Week 2): =LTGs d/t ELOS  Skilled Therapeutic Interventions/Progress Updates:    Pt seated in w/c on arrival and agreeable to therapy. Pt reports back pain intermittently, rest and positioning as needed. Pt transported to therapy gym for time  management and energy conservation. Pt participated in standing trials in // bars with mod-max A with L knee block and gait belt around hips to facilitate hip extension. Pt performed x 4 in this manner for benefit of weightbearing and RLE strength. Pt able to lock out L knee with assist, but demoes limited L glute activation. At this time, pt reports she has had increasing numbness in her stomach and buttocks, Pt states MD is aware. Attempted to switch for roho for skin protection, but appropriate cushion not available at this time. Squat pivot <>mat table with min A and cueing for safety with transfer. Session focused on bed mobility, with pt problem solving rolling with L leg loop for ease of movement. Attempted quadruped, but pt was unable to achieve position with +1 assist. Pt reports weakness in R wrist from previous break. Pt able to roll and supine>sit with use of leg loop and CGA, with max VC for technique and strategies. Pt again asked about prognosis and recovery, therapist reiterated that her symptoms are caused by spinal tumor, which they have not resected so symptoms are not likely to improve. Recommended pt discuss her prognosis in detail with MD. Pt returned to room and remained in w/c, was left with all needs in reach and alarm active.   Therapy Documentation Precautions:  Precautions Precautions: Fall Precaution Booklet Issued: No Precaution Comments: L LE hemplegia Restrictions Weight Bearing Restrictions: No General:      Therapy/Group: Individual Therapy  Mickel Fuchs 03/21/2022, 10:16 AM

## 2022-03-22 DIAGNOSIS — R203 Hyperesthesia: Secondary | ICD-10-CM

## 2022-03-22 DIAGNOSIS — S24109S Unspecified injury at unspecified level of thoracic spinal cord, sequela: Secondary | ICD-10-CM

## 2022-03-22 NOTE — Progress Notes (Signed)
PROGRESS NOTE   Subjective/Complaints:  Doing well today, pain manageable. Slept ok, not great but doesn't feel like she needs anything else for it. Urinating fine. LBM was this morning. Denies any other complaints or concerns.   ROS: +trouble sleeping  Pt denies SOB, abd pain, CP, N/V/C/D, and vision changes  Except for HPI  Objective:   No results found. No results for input(s): "WBC", "HGB", "HCT", "PLT" in the last 72 hours.  No results for input(s): "NA", "K", "CL", "CO2", "GLUCOSE", "BUN", "CREATININE", "CALCIUM" in the last 72 hours.   Intake/Output Summary (Last 24 hours) at 03/22/2022 [id number] Last data filed at 03/21/2022 1330 Gross per 24 hour  Intake 480 ml  Output --  Net 480 ml         Physical Exam: Vital Signs Blood pressure 113/75, pulse 79, temperature (!) 97.5 F (36.4 C), temperature source Oral, resp. rate 18, height '5\' 4"'$  (1.626 m), weight 90.3 kg, SpO2 96 %.   General: awake, alert, appropriate, sitting up in bed; NAD HENT: conjugate gaze; oropharynx moist CV: regular rate; no JVD Pulmonary: CTA B/L; no W/R/R- good air movement GI: soft, NT, ND, (+)BS- normoactive today Psychiatric: appropriate, spirits up Neurological: Ox3 - decreased to light touch on R foot still noted- Can now do DF 2-/5 and PF 2-/5 on L foot, noted again today  Prior exam Strength:                RUE: 5/5 SA, 5/5 EF, 5/5 EE, 5/5 WE, 5/5 FF, 5/5 FA                 LUE: 5/5 SA, 5/5 EF, 5/5 EE, 5/5 WE, 5/5 FF, 5/5 FA                 RLE: 5/5 HF, 5/5 KE, 5/5 DF, 5/5 EHL, 5/5 PF                 LLE:  2/5 HF, 1+/5 KE, 0/5 DF, 3/5 EHL, 1/5 PF  Neurologic exam: Strength able to lift bilateral upper extremity and right lower extremity to gravity, unable to lift left lower extremity to gravity, alert and awake, no dysarthria, patchy sensory loss approximately T5 and below on the left.,  Cranial nerves II through XII grossly intact,  no abnormal tone   Assessment/Plan: 1. Functional deficits which require 3+ hours per day of interdisciplinary therapy in a comprehensive inpatient rehab setting. Physiatrist is providing close team supervision and 24 hour management of active medical problems listed below. Physiatrist and rehab team continue to assess barriers to discharge/monitor patient progress toward functional and medical goals  Care Tool:  Bathing    Body parts bathed by patient: Left upper leg, Right arm, Left arm, Right lower leg, Chest, Abdomen, Left lower leg, Front perineal area, Face, Buttocks, Right upper leg         Bathing assist Assist Level: Moderate Assistance - Patient 50 - 74%     Upper Body Dressing/Undressing Upper body dressing   What is the patient wearing?: Pull over shirt    Upper body assist Assist Level: Contact Guard/Touching assist    Lower Body Dressing/Undressing  Lower body dressing      What is the patient wearing?: Underwear/pull up, Pants     Lower body assist Assist for lower body dressing: Moderate Assistance - Patient 50 - 74%     Toileting Toileting    Toileting assist Assist for toileting: Moderate Assistance - Patient 50 - 74%     Transfers Chair/bed transfer  Transfers assist     Chair/bed transfer assist level: Minimal Assistance - Patient > 75%     Locomotion Ambulation   Ambulation assist   Ambulation activity did not occur: Safety/medical concerns          Walk 10 feet activity   Assist  Walk 10 feet activity did not occur: Safety/medical concerns        Walk 50 feet activity   Assist Walk 50 feet with 2 turns activity did not occur: Safety/medical concerns         Walk 150 feet activity   Assist Walk 150 feet activity did not occur: Safety/medical concerns         Walk 10 feet on uneven surface  activity   Assist Walk 10 feet on uneven surfaces activity did not occur: Safety/medical concerns          Wheelchair     Assist Is the patient using a wheelchair?: Yes Type of Wheelchair: Manual    Wheelchair assist level: Supervision/Verbal cueing Max wheelchair distance: 130    Wheelchair 50 feet with 2 turns activity    Assist        Assist Level: Supervision/Verbal cueing   Wheelchair 150 feet activity     Assist      Assist Level: Moderate Assistance - Patient 50 - 74%   Blood pressure 113/75, pulse 79, temperature (!) 97.5 F (36.4 C), temperature source Oral, resp. rate 18, height '5\' 4"'$  (1.626 m), weight 90.3 kg, SpO2 96 %.  Medical Problem List and Plan: 1. 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  Antithrombotics: -DVT/anticoagulation:  Pharmaceutical: Lovenox '40mg'$  QD             -antiplatelet therapy: none   3. Pain Management:  -continue oxycodone 10 mg 5 x daily -continue Flexeril 5-10 mg q HS -continue Voltaren gel 2g QID, Advil '400mg'$  q6h PRN and Tylenol as needed, Robaxin '500mg'$  q6h PRN -Consider adjusting oxycodone to as needed   -2/28  will add gabapentin 300 mg QHS and increase again on Friday to TID  -2/29- tolerating well- no side effects 4. Mood/Behavior/Sleep: LCSW to evaluate and provide emotional support             -antipsychotic agents: n/a             -continue Lexapro '10mg'$  QHS and Elavil '100mg'$  QHS  -Trazodone 25-'50mg'$  QHS PRN  -03/15/22 start '6mg'$  melatonin QHS-helping 03/16/22, continue   -2/27- still waking up ~ 4am- but doesn't want to change meds yet  -2/29- waking up early, but no med changes per pt.  5. Neuropsych/cognition: This patient is capable of making decisions on her own behalf.  6. Skin/Wound Care: Routine skin care  checks   7. Fluids/Electrolytes/Nutrition: Routine Is and Os and follow-up chemistries, next 03/17/22  -03/15/22 Na 134 noted 03/14/22, monitor on following labs next week -2/26 Bun up to 24, pt plans to have yeti cup brought over by family to increase fluid intake, recheck Wednesday for azotemia   -2/28- BUN down to 17 from 24- will con't to monitor weekly. 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  -03/22/22 LBM this morning, no incontinence documented but pt states lack of sensation still present; monitor 13. Urinary urgency vs. Neurogenic bladder. Continent.              - UA 2/22 negative except for small HgB -2/29- going well- no inconitnence and retaining  small amounts, but not enough to need cathing 14.  Leukocytosis: 11.0 on 03/14/22 -Likely due to Decadron/hemoconcentration, no signs of infection -03/17/22 WNL WBC, monitor weekly, next 03/24/22  15. Hyponatremia   -Start salt tabs BID, recheck Wednesday -2/28- Na 134- doing better- con't regimen for now, monitor weekly next on 03/24/22   [place]: [place], 7:22 AM

## 2022-03-23 NOTE — Progress Notes (Signed)
Occupational Therapy Session Note  Patient Details  Name: Kelly [name] MRN: [id number] Date of Birth: 09-13-1965  Today's Date: 03/23/2022 OT Missed Time: 72 Minutes Missed Time Reason: Pain;Other (comment) (Pt reports on bedpan needing more time for toileting. 8-9/10 pain reported having just received pain meds a minute prior.)   Pt supine in bed, reports she does not feel up to therapy right now due to 8-9/10 pain and also toileting on bed pan requiring extra time.  Attempted alternate time to treat, however patient scheduled with another therapist, therefore missed 45 minutes of treatment today.       Therapy Documentation Precautions:  Precautions Precautions: Fall Precaution Booklet Issued: No Precaution Comments: L LE hemplegia Restrictions Weight Bearing Restrictions: No   Therapy/Group: Individual Therapy  Ezekiel Slocumb 03/23/2022, 6:30 AM

## 2022-03-23 NOTE — Progress Notes (Signed)
Occupational Therapy Note  Patient Details  Name: Kelly [name] MRN: [id number] Date of Birth: [birth year]  Today's Date: 03/23/2022 OT Missed Time: 32 Minutes Missed Time Reason: Patient unwilling/refused to participate without medical reason  Pt resting in bed upon arrival wearing hospital gown. Pt wanted to get dressed before therapy but wanted female staff to assist. RN notified of pt's request. Pt missed 60 mins skilled OT services. Husband not present for scheduled education.    Leotis Shames [facility] 03/23/2022, 2:19 PM

## 2022-03-23 NOTE — Progress Notes (Signed)
PROGRESS NOTE   Subjective/Complaints:  Doing well today, pain still manageable, not having side effects from gabapentin. Slept ok, "good enough". Urinating fine. LBM was yesterday, not having any more incontinence although still has sacral area numbness. Denies any other complaints or concerns.   ROS: +trouble sleeping-stable  Pt denies SOB, abd pain, CP, N/V/C/D, and vision changes  Except for HPI  Objective:   No results found. No results for input(s): "WBC", "HGB", "HCT", "PLT" in the last 72 hours.  No results for input(s): "NA", "K", "CL", "CO2", "GLUCOSE", "BUN", "CREATININE", "CALCIUM" in the last 72 hours.   Intake/Output Summary (Last 24 hours) at 03/23/2022 1227 Last data filed at 03/23/2022 0900 Gross per 24 hour  Intake 386 ml  Output 850 ml  Net -464 ml        Physical Exam: Vital Signs Blood pressure 101/71, pulse 83, temperature 97.8 F (36.6 C), temperature source Oral, resp. rate 16, height '5\' 4"'$  (1.626 m), weight 90.3 kg, SpO2 96 %.   General: awake, alert, appropriate, sitting up in bed; NAD HENT: conjugate gaze; oropharynx moist CV: regular rate; no JVD Pulmonary: CTA B/L; no W/R/R- good air movement GI: soft, NT, ND, (+)BS- normoactive today Psychiatric: appropriate, spirits up Neurological: Ox3 - decreased to light touch on R foot still noted- Can now do DF 2-/5 and PF 2-/5 on L foot, noted again today  Prior exam Strength:                RUE: 5/5 SA, 5/5 EF, 5/5 EE, 5/5 WE, 5/5 FF, 5/5 FA                 LUE: 5/5 SA, 5/5 EF, 5/5 EE, 5/5 WE, 5/5 FF, 5/5 FA                 RLE: 5/5 HF, 5/5 KE, 5/5 DF, 5/5 EHL, 5/5 PF                 LLE:  2/5 HF, 1+/5 KE, 0/5 DF, 3/5 EHL, 1/5 PF  Neurologic exam: Strength able to lift bilateral upper extremity and right lower extremity to gravity, unable to lift left lower extremity to gravity, alert and awake, no dysarthria, patchy sensory loss  approximately T5 and below on the left.,  Cranial nerves II through XII grossly intact, no abnormal tone   Assessment/Plan: 1. Functional deficits which require 3+ hours per day of interdisciplinary therapy in a comprehensive inpatient rehab setting. Physiatrist is providing close team supervision and 24 hour management of active medical problems listed below. Physiatrist and rehab team continue to assess barriers to discharge/monitor patient progress toward functional and medical goals  Care Tool:  Bathing    Body parts bathed by patient: Left upper leg, Right arm, Left arm, Right lower leg, Chest, Abdomen, Left lower leg, Front perineal area, Face, Buttocks, Right upper leg         Bathing assist Assist Level: Moderate Assistance - Patient 50 - 74%     Upper Body Dressing/Undressing Upper body dressing   What is the patient wearing?: Pull over shirt    Upper body assist Assist Level: Contact Guard/Touching assist  Lower Body Dressing/Undressing Lower body dressing      What is the patient wearing?: Underwear/pull up, Pants     Lower body assist Assist for lower body dressing: Moderate Assistance - Patient 50 - 74%     Toileting Toileting    Toileting assist Assist for toileting: Moderate Assistance - Patient 50 - 74%     Transfers Chair/bed transfer  Transfers assist     Chair/bed transfer assist level: Minimal Assistance - Patient > 75%     Locomotion Ambulation   Ambulation assist   Ambulation activity did not occur: Safety/medical concerns          Walk 10 feet activity   Assist  Walk 10 feet activity did not occur: Safety/medical concerns        Walk 50 feet activity   Assist Walk 50 feet with 2 turns activity did not occur: Safety/medical concerns         Walk 150 feet activity   Assist Walk 150 feet activity did not occur: Safety/medical concerns         Walk 10 feet on uneven surface  activity   Assist Walk 10  feet on uneven surfaces activity did not occur: Safety/medical concerns         Wheelchair     Assist Is the patient using a wheelchair?: Yes Type of Wheelchair: Manual    Wheelchair assist level: Supervision/Verbal cueing Max wheelchair distance: 130    Wheelchair 50 feet with 2 turns activity    Assist        Assist Level: Supervision/Verbal cueing   Wheelchair 150 feet activity     Assist      Assist Level: Moderate Assistance - Patient 50 - 74%   Blood pressure 101/71, pulse 83, temperature 97.8 F (36.6 C), temperature source Oral, resp. rate 16, height '5\' 4"'$  (1.626 m), weight 90.3 kg, SpO2 96 %.  Medical Problem List and Plan: 1. 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  Antithrombotics: -DVT/anticoagulation:  Pharmaceutical: Lovenox '40mg'$  QD             -antiplatelet therapy: none   3. Pain Management:  -continue oxycodone 10 mg 5 x daily -continue Flexeril 5-10 mg q HS -continue Voltaren gel 2g QID, Advil '400mg'$  q6h PRN and Tylenol as needed, Robaxin '500mg'$  q6h PRN -Consider adjusting oxycodone to as needed   -2/28  will add gabapentin 300 mg QHS and increase again on Friday to TID  -2/29- tolerating well- no side effects 4. Mood/Behavior/Sleep: LCSW to evaluate and provide emotional support             -antipsychotic agents: n/a             -continue Lexapro '10mg'$  QHS and Elavil '100mg'$  QHS  -Trazodone 25-'50mg'$  QHS PRN  -03/15/22 start '6mg'$  melatonin QHS-helping 03/16/22, continue   -2/27- still waking up ~ 4am- but doesn't want to change meds yet  -2/29- waking up early, but no med changes per pt.   -03/23/22 still moderately poor sleep but doesn't want med changes 5.  Neuropsych/cognition: This  patient is capable of making decisions on her own behalf.   6. Skin/Wound Care: Routine skin care checks   7. Fluids/Electrolytes/Nutrition: Routine Is and Os and follow-up chemistries, next 03/17/22  -03/15/22 Na 134 noted 03/14/22, monitor on following labs next week -2/26 Bun up to 24, pt plans to have yeti cup brought over by family to increase fluid intake, recheck Wednesday for azotemia   -2/28- BUN down to 17 from 24- will con't to monitor weekly. 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  10: Obesity, Class III- BMI 34             - Complicates medical decision making; nutrition onboard   11: GI prophylaxis: Protonix '40mg'$  BID   12: Constipation vs neurogenic bowel: Given smog enema yesterday             -Senokot 2tabs QHS             -Miralax 17g BID  -PRNs: MoM, Fleet's, Sorbitol 27m -2/23 had a large bowel movement yesterday, had bowel movements the day prior also, continue to monitor -3/1- will make Miralax prn since having a lot of bowel incontinence- due to loose stools coupled with lack of sensation.  -03/22/22 LBM this morning, no incontinence documented but pt states lack of sensation still present; monitor 13. Urinary urgency vs. Neurogenic  bladder. Continent.              - UA 2/22 negative except for small HgB -2/29- going well- no inconitnence and retaining small amounts, but not enough to need cathing 14.  Leukocytosis: 11.0 on 03/14/22 -Likely due to Decadron/hemoconcentration, no signs of infection -03/17/22 WNL WBC, monitor weekly, next 03/24/22  15. Hyponatremia   -Start salt tabs BID, recheck Wednesday -2/28- Na 134- doing better- con't regimen for now, monitor weekly next on 03/24/22   [place]: [place]2022, 12:27 PM

## 2022-03-23 NOTE — Progress Notes (Signed)
Physical Therapy Session Note  Patient Details  Name: Kelly [name] MRN: [id number] Date of Birth: 28-Oct-1965  Today's Date: 03/23/2022 PT Individual Time: 1045-1200 PT Individual Time Calculation (min): 75 min   Short Term Goals: Week 2:  PT Short Term Goal 1 (Week 2): =LTGs d/t ELOS  Skilled Therapeutic Interventions/Progress Updates: Pt presented in bed with RN and husband present agreeable to therapy. Session focused on hands on family education and attempt at real car transfer. After receiving meds RN left room. Pt stated very fatigued due to having performed stands in [place] to complete clothing management as well as not sleeping well night before. PTA spent first part of session educating on types of transfers (squat pivot between bed and chair and stand pivot via bear hug to car). PTA also educated pt on type of chair primary therapist is considering for w/c eval [facility]) PTA also obtained w/c to husband can get visual. Pt then agreeable to transfer to w/c and attempt car transfer. Family stated they have practiced squat pivots before and husband stated felt comfortable with them therefore set up w/c for transfer and had husband perform transfer. Pt was able to sit EOB holding foot board while w/c set up. Husband attempted transfer x 2 however husband attempted to lift pt on both occasions and pt slid forward. On second attempt PTA providing maxA to return pt to bed as pt slid significant forward. PTA then intervened and completed transfer to w/c with  modA. Pt then transported to main entrance and once car brought up discussed potential methods to attempt transfer. Pt demonstrated limited insight into deficits and expressed that she wanted to try transfer on own. Both therapist and husband expressed that that would be unsafe and to not attempt. Due to height pt was unable to reach interior handle to pull self up but was able to reach when PTA assisted pt in standing. PTA performed x 2 stands  with pt reaching for handle but pt was too fatigued to completely twist to shift hips safely into vehicle. Pt did perform x 1 stand with PTA educating on blocking L knee, with husband also able to perform partial pivot but unable to fully clear hips to seat. Lengthy discussion regarding how much pt's fatigue is playing into situation as pt has required increased assistance this am. Husband agreeable to come in Mon to continue working on transfers. Pt transported back to room and discussed w/c to bed transfers. Per pt will have either hospital bed or rail on standard bed. Pt was able to verbalize technique on returning to bed with PTA suggesting can use bed rail on days when particularly fatigued. Pt set up w/c and was able to perform squat pivot transfer holding bed rail with light minA. Pt then performed squat pivot transfer to L however required modA. Pt returned to bed in same manner as prior and required minA to transfer to supine. Pt left in bed at end of session and bed alarm on, call bell within reach and needs met.      Therapy Documentation Precautions:  Precautions Precautions: Fall Precaution Booklet Issued: No Precaution Comments: L LE hemplegia Restrictions Weight Bearing Restrictions: No General:    Therapy/Group: Individual Therapy  Tessy Pawelski 03/23/2022, 12:18 PM

## 2022-03-24 ENCOUNTER — Ambulatory Visit: Payer: Medicaid Other

## 2022-03-24 ENCOUNTER — Inpatient Hospital Stay (HOSPITAL_COMMUNITY): Payer: Medicaid Other

## 2022-03-24 DIAGNOSIS — F4321 Adjustment disorder with depressed mood: Secondary | ICD-10-CM

## 2022-03-24 LAB — URINALYSIS, ROUTINE W REFLEX MICROSCOPIC
Bacteria, UA: NONE SEEN
Bilirubin Urine: NEGATIVE
Glucose, UA: NEGATIVE mg/dL
Hgb urine dipstick: NEGATIVE
Ketones, ur: NEGATIVE mg/dL
Nitrite: NEGATIVE
Protein, ur: NEGATIVE mg/dL
Specific Gravity, Urine: 1.011 (ref 1.005–1.030)
pH: 6 (ref 5.0–8.0)

## 2022-03-24 LAB — CBC
HCT: 47.7 % — ABNORMAL HIGH (ref 36.0–46.0)
Hemoglobin: 16.5 g/dL — ABNORMAL HIGH (ref 12.0–15.0)
MCH: 35.5 pg — ABNORMAL HIGH (ref 26.0–34.0)
MCHC: 34.6 g/dL (ref 30.0–36.0)
MCV: 102.6 fL — ABNORMAL HIGH (ref 80.0–100.0)
Platelets: 217 10*3/uL (ref 150–400)
RBC: 4.65 MIL/uL (ref 3.87–5.11)
RDW: 12 % (ref 11.5–15.5)
WBC: 9.3 10*3/uL (ref 4.0–10.5)
nRBC: 0 % (ref 0.0–0.2)

## 2022-03-24 LAB — BASIC METABOLIC PANEL
Anion gap: 7 (ref 5–15)
BUN: 20 mg/dL (ref 6–20)
CO2: 23 mmol/L (ref 22–32)
Calcium: 8.1 mg/dL — ABNORMAL LOW (ref 8.9–10.3)
Chloride: 102 mmol/L (ref 98–111)
Creatinine, Ser: 0.71 mg/dL (ref 0.44–1.00)
GFR, Estimated: >60 mL/min (ref >60)
Glucose, Bld: 126 mg/dL — ABNORMAL HIGH (ref 70–99)
Potassium: 4.3 mmol/L (ref 3.5–5.1)
Sodium: 132 mmol/L — ABNORMAL LOW (ref 135–145)

## 2022-03-24 LAB — LACTIC ACID, PLASMA
Lactic Acid, Venous: 1.8 mmol/L (ref 0.5–1.9)
Lactic Acid, Venous: 2 mmol/L (ref 0.5–1.9)

## 2022-03-24 LAB — GLUCOSE, CAPILLARY: Glucose-Capillary: 126 mg/dL — ABNORMAL HIGH (ref 70–99)

## 2022-03-24 LAB — TROPONIN I (HIGH SENSITIVITY)
Troponin I (High Sensitivity): 4 ng/L (ref <18)
Troponin I (High Sensitivity): 4 ng/L (ref <18)

## 2022-03-24 LAB — PROCALCITONIN: Procalcitonin: 5.89 ng/mL

## 2022-03-24 MED ORDER — CALCIUM CARBONATE ANTACID 500 MG PO CHEW
400.0000 mg | CHEWABLE_TABLET | Freq: Three times a day (TID) | ORAL | Status: DC
  Administered 2022-03-24 – 2022-03-28 (×12): 400 mg via ORAL
  Filled 2022-03-24 (×12): qty 2

## 2022-03-24 MED ORDER — SODIUM CHLORIDE 0.9 % IV SOLN: INTRAVENOUS | Status: DC

## 2022-03-24 MED ORDER — MELATONIN 5 MG PO TABS
10.0000 mg | ORAL_TABLET | Freq: Every day | ORAL | Status: DC
  Administered 2022-03-24 – 2022-03-27 (×4): 10 mg via ORAL
  Filled 2022-03-24 (×4): qty 2

## 2022-03-24 MED ORDER — OXYCODONE HCL 5 MG PO TABS: 15.0000 mg | ORAL_TABLET | Freq: Every day | ORAL | Status: DC

## 2022-03-24 MED ORDER — SODIUM CHLORIDE 0.9 % IV SOLN
500.0000 mg | INTRAVENOUS | Status: DC
  Administered 2022-03-24 – 2022-03-27 (×4): 500 mg via INTRAVENOUS
  Filled 2022-03-24 (×4): qty 5

## 2022-03-24 MED ORDER — SODIUM CHLORIDE 0.9 % IV SOLN
2.0000 g | INTRAVENOUS | Status: DC
  Administered 2022-03-24 – 2022-03-28 (×5): 2 g via INTRAVENOUS
  Filled 2022-03-24 (×6): qty 20

## 2022-03-24 MED ORDER — SODIUM CHLORIDE 0.9 % IV BOLUS
500.0000 mL | Freq: Once | INTRAVENOUS | Status: AC
  Administered 2022-03-24: 500 mL via INTRAVENOUS

## 2022-03-24 NOTE — Consult Note (Signed)
Initial Consultation Note   Patient: Kelly [name] DOB: 1965-02-03 PCP: [facility] DOA: 03/13/2022 DOS: the patient was seen and examined on 03/24/2022 Primary service: Courtney Heys, MD  Referring physician: Reesa Chew & Dr. Dagoberto Ligas Reason for consult: Hypotension   Assessment/Plan: Hypotension  - Hold narcotics/gabapentin as they can cause hypotension  - IV NS 100 cc/hr   - Stat CXR - Stat UA  - Stat blood cx - Further management pending the workup  2. L sided pneumonia on CXR from 03/24/2022 - IV ceftriaxone 2 g daily  - IV azithromycin 500 mg daily  - IV NS 100 cc/hr   TRH will continue to follow the patient.  HPI: Kelly [name] is a 57 y.o. 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 She is currently in the rehab section of [facility] on the 4th floor. Medicine was consulted on 03/24/2022 due to hypotension. Upon arrival to the pt's room the pt is alert and oriented x 3 and able to answer questions appropriately. Her BP has somewhat responded to the fluid bolus challenges. However, we will proceed with a more in-depth workup as noted above. Further management will depend on the findings from the aforementioned workup.   Review of Systems: As mentioned in the history of present illness. All other systems reviewed and are negative. Past Medical History:  Diagnosis Date   Arthritis    Cancer [facility])    Depression    Dyslipidemia    Essential hypertension 07/31/2015   Hypothyroidism 07/31/2015   doctor took her off medication   Past Surgical History:  Procedure Laterality Date   BLADDER SUSPENSION     CRANIOTOMY Right 03/04/2021   Procedure: CRANIOTOMY TUMOR RESECTION W/ BRAIN LAB;  Surgeon: Newman Pies, MD;  Location: [facility];  Service: Neurosurgery;  Laterality: Right;   TONSILLECTOMY     TUBAL LIGATION     Social History:  reports that she has been smoking cigarettes. She has a  28.50 pack-year smoking history. She has never used smokeless tobacco. She reports that she does not drink alcohol and does not use drugs.  Allergies  Allergen Reactions   Ranitidine Anaphylaxis    Family History  Problem Relation Age of Onset   Healthy Mother    Aneurysm Father    Hypertension Father    Hyperlipidemia Father    Healthy Sister    Healthy Daughter    Heart disease Son     Prior to Admission medications   Medication Sig Start Date End Date Taking? Authorizing Provider  acetaminophen (TYLENOL) 325 MG tablet Take 2 tablets (650 mg total) by mouth every 4 (four) hours as needed for fever, headache or mild pain. 03/13/22   Rai, Ripudeep K, MD  amitriptyline (ELAVIL) 100 MG tablet TAKE 1 TABLET BY MOUTH EVERYDAY AT BEDTIME Patient taking differently: TAKE 2 TABLET BY MOUTH EVERYDAY AT BEDTIME 02/17/22   Vaslow, Acey Lav, MD  atorvastatin (LIPITOR) 10 MG tablet Take 10 mg by mouth at bedtime. 10/16/20   [provider]  cyclobenzaprine (FLEXERIL) 10 MG tablet Take 5-10 mg by mouth 3 (three) times daily as needed for muscle spasms. 08/18/21   [provider]  dexamethasone (DECADRON) 6 MG tablet Take 1 tablet (6 mg total) by mouth every 6 (six) hours. 03/13/22   Rai, Vernelle Emerald, MD  diclofenac Sodium (VOLTAREN) 1 % GEL Apply 2 g topically 4 (four) times daily. 03/13/22   Rai, Ripudeep  K, MD  escitalopram (LEXAPRO) 10 MG tablet Take 10 mg by mouth at bedtime. 12/17/20   [provider]  ibuprofen (ADVIL) 400 MG tablet Take 1 tablet (400 mg total) by mouth every 6 (six) hours as needed for mild pain or headache. 03/13/22   Rai, Vernelle Emerald, MD  Lacosamide 100 MG TABS Take 1 tablet (100 mg total) by mouth 2 (two) times daily. 02/17/22   Ventura Sellers, MD  levETIRAcetam (KEPPRA) 750 MG tablet TAKE 2 TABLETS (1,500 MG TOTAL) BY MOUTH 2 (TWO) TIMES DAILY. 02/17/22   Ventura Sellers, MD  lisinopril (ZESTRIL) 10 MG tablet Take 10 mg by mouth at bedtime.     [provider]  oxyCODONE 10 MG TABS Take 1 tablet (10 mg total) by mouth every 4 (four) hours as needed for severe pain. 03/13/22   Rai, Vernelle Emerald, MD  pantoprazole (PROTONIX) 40 MG tablet Take 1 tablet (40 mg total) by mouth 2 (two) times daily before a meal. 03/13/22   Rai, Ripudeep K, MD  polyethylene glycol (MIRALAX / GLYCOLAX) 17 g packet Take 17 g by mouth 2 (two) times daily. 03/13/22   Rai, Vernelle Emerald, MD  prochlorperazine (COMPAZINE) 10 MG tablet Take 1 tablet (10 mg total) by mouth every 6 (six) hours as needed for nausea or vomiting. 12/09/21   Vaslow, Acey Lav, MD  senna-docusate (SENOKOT-S) 8.6-50 MG tablet Take 2 tablets by mouth at bedtime. 03/13/22   Rai, Vernelle Emerald, MD  temozolomide (TEMODAR) 100 MG capsule Take 1,600 mg by mouth as directed. May take on an empty stomach or at bedtime to decrease nausea & vomiting. Take 4 tablets (1600 mg) for 5 Days. Then Hold for 23 Days    [provider]    Physical Exam: Vitals:   03/24/22 1057 03/24/22 1105 03/24/22 1222 03/24/22 1250  BP: (!) 75/57 (!) 73/52 (!) 88/60 (!) 76/49  Pulse: (!) 109 94 94 (!) 104  Resp:      Temp:      TempSrc:      SpO2: (!) 86% 94% 94% 93%  Weight:      Height:       Physical Exam HENT:     Head: Normocephalic and atraumatic.     Mouth/Throat:     Mouth: Mucous membranes are dry.  Cardiovascular:     Rate and Rhythm: Tachycardia present.  Pulmonary:     Effort: Pulmonary effort is normal.  Abdominal:     Palpations: Abdomen is soft.     Comments: Obese   Musculoskeletal:     Cervical back: Neck supple.  Skin:    General: Skin is warm.  Neurological:     Mental Status: She is alert and oriented to person, place, and time.  Psychiatric:        Mood and Affect: Mood normal.    Data Reviewed:   Results are pending, will review when available.    Family Communication: Husband at the bedside  Thank you very much for involving [place] in the care of your  patient.  Author: Lucienne Minks , MD 03/24/2022 1:17 PM  For on call review [email or web address].

## 2022-03-24 NOTE — Progress Notes (Signed)
Physical Therapy Session Note  Patient Details  Name: Kelly [name] MRN: [id number] Date of Birth: 1965/09/13  Today's Date: 03/24/2022 PT Missed Time: 75 Minutes Missed Time Reason: Patient ill (Comment) (pt slurring words and rapid called by nsg shortly before session)  Short Term Goals: Week 2:  PT Short Term Goal 1 (Week 2): =LTGs d/t ELOS  Skilled Therapeutic Interventions/Progress Updates:    Pt scheduled for w/c eval, but on arrival RN informed therapist that rapid has been called d/t pt slurring words. Introduced Buyer, retail, [employer], to pt's husband and left lighter weight w/c in room. Discussed pt's LOS and possible extension with pt's husband d/t change in medical status and slow progress to allow for more education and practice. Tim expressed concern about managing at home as he has already hurt his back while practicing with therapy. Will address with further practice. Pt missed 75 min of scheduled therapy, will make up as able.   Therapy Documentation Precautions:  Precautions Precautions: Fall Precaution Booklet Issued: No Precaution Comments: L LE hemplegia Restrictions Weight Bearing Restrictions: No General: PT Amount of Missed Time (min): 75 Minutes PT Missed Treatment Reason: Patient ill (Comment) (pt slurring words and rapid called by nsg shortly before session)     Therapy/Group: Individual Therapy  Kelly [name] 03/24/2022, 10:42 AM

## 2022-03-24 NOTE — Significant Event (Addendum)
Patient husband came to RN in hallway concerned about his wife's onset of slurred speech. RN went to patient room, called Charge nurse and was advised to call RR after focused assessment. See vitals and subsequent notes for new orders. RN hadassessed patient and passed morning meds at approx 0730 c occupational therapist present and pt was asymptomatic.

## 2022-03-24 NOTE — Progress Notes (Signed)
Occupational Therapy Note  Patient Details  Name: Kelly [name] MRN: [id number] Date of Birth: 1965/06/25  Today's Date: 03/24/2022 OT Missed Time: 53 Minutes Missed Time Reason: Other (comment) (Rapid Response)  Pt missed 45 mins skilled OT services. Rapid Response in room.   Leotis Shames [facility] 03/24/2022, 10:49 AM

## 2022-03-24 NOTE — Progress Notes (Signed)
Occupational Therapy Session Note  Patient Details  Name: Kelly [name] MRN: [id number] Date of Birth: Jan 22, 1965  Today's Date: 03/24/2022 OT Individual Time: 0702-0816 OT Individual Time Calculation (min): 74 min    Short Term Goals: Week 2:  OT Short Term Goal 1 (Week 2): STG=LTG 2/2 ELOS  Skilled Therapeutic Interventions/Progress Updates:    Patient agreeable to participate in OT session. Reports 9/10 pain level in back. Nursing provided pain meds during therapy session. Monitored during session.    Patient participated in skilled OT session focusing on endurance, ADL re-training, functional transfers, core strength and stability. SB brought to session to assess pt's functional performance during bed to [facility] although was not used due to safety reasons. Pt reports that she had been washed up this morning with a clean shirt on. Pt assisted with LB dressing in bed including Total assist to don TEDs and shoes. Pt donned pants at bed level with Mod-max assist. During LB dressing, HOB raised all the way although pt required additional physical assist to maintain an upright sitting posture. Pt with increased fatigue and decreased activity tolerance requiring frequent rest breaks supine during session. Pt reliant on external supports to maintain sitting balance while seated EOB including bed rail, footboard and manual assist from therapist.  Bariatric DABSC brought to bedside to complete lateral scoot transfer from bed to commode. Pt able to anterior and posterior scoot while seated EOB with Min guard assist. Unable to demonstrate effective lateral scoot to the right in order to transfer to [facility]. VC and visual cues provided on hand placement, technique, and safety although due to lack of core strength and pt's increasing fatigue level, transfer attempt was terminated.  Therapist educated pt to complete lateral scoot towards [facility] prior to transitioning supine. Pt unable to complete and bed chuc pad not  underneath pt to allow pt to assist. Max A provided to transition to sitting EOB to supine. Bed placed in trendelenburg to assist with boosting pt in bed. Pt able to use BUE and hold headboard and pull herself up to headboard without physical assist. Pants were then pulled up over hips with Mod Assist by pt complete half bridging and rolling onto side.     Therapy Documentation Precautions:  Precautions Precautions: Fall Precaution Booklet Issued: No Precaution Comments: L LE hemplegia Restrictions Weight Bearing Restrictions: No   Therapy/Group: Individual Therapy  Ailene Ravel, OTR/L,CBIS  Supplemental OT - [facility] and [facility] Secure Chat Preferred   03/24/2022, 6:51 AM

## 2022-03-24 NOTE — Progress Notes (Addendum)
Patient ID: Kelly [name], female   DOB: March 02, 1965, 58 y.o.   MRN: [id number]  DME delivered to room- TTB and DABSC. Pt would asking about hospital bed and incontinence supplies. SW shared will confirm with medical team if needed.   SW received updates from PA-Kelly [name] reporting radiation treatment will need to be cancelled today due to BP issues.   SW cancelled transportation with [facility]. SW left message for [name]/ Cancer center to inform on cancelled radiation today, and to discuss rescheduling.   *Additional radiation appt on Wednesday (3/6) at 1:15pm and will meet with Dr. Mickeal [name] afterwards. [facility] p/u at 12:30pm.   Kelly [name], MSW, [place] Office: [phone] Cell: [phone] Fax: [phone]

## 2022-03-24 NOTE — Progress Notes (Signed)
PROGRESS NOTE   Subjective/Complaints:  Pt reports LBM yesterday Having burning in midline chest- and has been affecting voice- admits eating more "junk"- not sure if it's Reflux, already on Protonix 40 mg BID.   Back pain getting worse- having to take pain meds AND ibuprofen- and not enough.     Also said a therapist told her she wouldn't get LLE strength back- I explained I don't think we can say that yet.   ROS:   Pt denies SOB, abd pain, CP, N/V/C/D, and vision changes   Except for HPI  Objective:   No results found. Recent Labs    03/24/22 0622  WBC 9.3  HGB 16.5*  HCT 47.7*  PLT 217    Recent Labs    03/24/22 0622  NA 132*  K 4.3  CL 102  CO2 23  GLUCOSE 126*  BUN 20  CREATININE 0.71  CALCIUM 8.1*     Intake/Output Summary (Last 24 hours) at 03/24/2022 0807 Last data filed at 03/24/2022 0535 Gross per 24 hour  Intake 236 ml  Output 2200 ml  Net -1964 ml        Physical Exam: Vital Signs Blood pressure 97/63, pulse 100, temperature 98 F (36.7 C), resp. rate 18, height '5\' 4"'$  (1.626 m), weight 90.3 kg, SpO2 93 %.    General: awake, alert, appropriate, sitting EOB, but having to hold onto bed to stay upright, OT in room; NAD HENT: conjugate gaze; oropharynx a little dry CV: regular to borderline tachycardic  rate; no JVD Pulmonary: CTA B/L; no W/R/R- good air movement GI: soft, NT, ND, (+)BS- slightly hypoactive Psychiatric: appropriate- interactive Neurological: Ox3  - decreased to light touch on R foot still noted- Can now do DF 2-/5 and PF 2-/5 on L foot, noted again today  Prior exam Strength:                RUE: 5/5 SA, 5/5 EF, 5/5 EE, 5/5 WE, 5/5 FF, 5/5 FA                 LUE: 5/5 SA, 5/5 EF, 5/5 EE, 5/5 WE, 5/5 FF, 5/5 FA                 RLE: 5/5 HF, 5/5 KE, 5/5 DF, 5/5 EHL, 5/5 PF                 LLE:  2/5 HF, 1+/5 KE, 0/5 DF, 3/5 EHL, 1/5 PF  Neurologic exam: Strength able  to lift bilateral upper extremity and right lower extremity to gravity, unable to lift left lower extremity to gravity, alert and awake, no dysarthria, patchy sensory loss approximately T5 and below on the left.,  Cranial nerves II through XII grossly intact, no abnormal tone   Assessment/Plan: 1. Functional deficits which require 3+ hours per day of interdisciplinary therapy in a comprehensive inpatient rehab setting. Physiatrist is providing close team supervision and 24 hour management of active medical problems listed below. Physiatrist and rehab team continue to assess barriers to discharge/monitor patient progress toward functional and medical goals  Care Tool:  Bathing    Body parts bathed by patient: Left upper  leg, Right arm, Left arm, Right lower leg, Chest, Abdomen, Left lower leg, Front perineal area, Face, Buttocks, Right upper leg         Bathing assist Assist Level: Moderate Assistance - Patient 50 - 74%     Upper Body Dressing/Undressing Upper body dressing   What is the patient wearing?: Pull over shirt    Upper body assist Assist Level: Contact Guard/Touching assist    Lower Body Dressing/Undressing Lower body dressing      What is the patient wearing?: Underwear/pull up, Pants     Lower body assist Assist for lower body dressing: Moderate Assistance - Patient 50 - 74%     Toileting Toileting    Toileting assist Assist for toileting: Moderate Assistance - Patient 50 - 74%     Transfers Chair/bed transfer  Transfers assist     Chair/bed transfer assist level: Minimal Assistance - Patient > 75%     Locomotion Ambulation   Ambulation assist   Ambulation activity did not occur: Safety/medical concerns          Walk 10 feet activity   Assist  Walk 10 feet activity did not occur: Safety/medical concerns        Walk 50 feet activity   Assist Walk 50 feet with 2 turns activity did not occur: Safety/medical concerns          Walk 150 feet activity   Assist Walk 150 feet activity did not occur: Safety/medical concerns         Walk 10 feet on uneven surface  activity   Assist Walk 10 feet on uneven surfaces activity did not occur: Safety/medical concerns         Wheelchair     Assist Is the patient using a wheelchair?: Yes Type of Wheelchair: Manual    Wheelchair assist level: Supervision/Verbal cueing Max wheelchair distance: 130    Wheelchair 50 feet with 2 turns activity    Assist        Assist Level: Supervision/Verbal cueing   Wheelchair 150 feet activity     Assist      Assist Level: Moderate Assistance - Patient 50 - 74%   Blood pressure 97/63, pulse 100, temperature 98 F (36.7 C), resp. rate 18, height '5\' 4"'$  (1.626 m), weight 90.3 kg, SpO2 93 %.  Medical Problem List and Plan: 1. 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  Antithrombotics: -DVT/anticoagulation:  Pharmaceutical: Lovenox '40mg'$  QD             -antiplatelet therapy: none   3. Pain Management:  -continue oxycodone 10 mg 5 x daily -continue Flexeril 5-10 mg q HS -continue Voltaren gel 2g QID, Advil '400mg'$  q6h PRN and Tylenol as needed, Robaxin '500mg'$  q6h PRN -Consider adjusting oxycodone to as needed   -2/28  will add gabapentin 300 mg QHS and increase again on Friday to TID  -2/29- tolerating well- no side effects  3/4- will increase oxycodone to 15 mg 5x/day- said also taking ibuprofen as needed 4. Mood/Behavior/Sleep: LCSW to evaluate and provide emotional support             -  antipsychotic agents: n/a             -continue Lexapro '10mg'$  QHS and Elavil '100mg'$  QHS  -Trazodone 25-'50mg'$  QHS PRN  -03/15/22 start '6mg'$   melatonin QHS-helping 03/16/22, continue   -2/27- still waking up ~ 4am- but doesn't want to change meds yet  -2/29- waking up early, but no med changes per pt.   -03/23/22 still moderately poor sleep but doesn't want med changes 5. Neuropsych/cognition: This patient is capable of making decisions on her own behalf.   6. Skin/Wound Care: Routine skin care checks   7. Fluids/Electrolytes/Nutrition: Routine Is and Os and follow-up chemistries, next 03/17/22  -03/15/22 Na 134 noted 03/14/22, monitor on following labs next week -2/26 Bun up to 24, pt plans to have yeti cup brought over by family to increase fluid intake, recheck Wednesday for azotemia   -2/28- BUN down to 17 from 24- will con't to monitor weekly. Next 03/24/22 8: Hypertension: monitor TID and prn             -continue lisinopril 10 mg daily  3/4- BP soft this AM- but not symptomatic- con't regimen and monitor Vitals:   03/21/22 0425 03/21/22 1308 03/21/22 1435 03/21/22 2007  BP: 110/72 107/64 102/77 115/76   03/22/22 0442 03/22/22 1340 03/22/22 1947 03/23/22 0450  BP: 113/75 101/68 106/64 101/71   03/23/22 1323 03/23/22 2010 03/23/22 2011 03/24/22 0534  BP: 113/83 112/69 109/65 97/63       9: Glioblastoma s/p resection, 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:             -continue Decadron 6 mg q 6 hours -continue Keppra 1500 mg BID and Vimpat 50 mg BID for seizure ppx             - XRT x10 followed by oral chemotherapy   -2/27- to start Radiation today at 2:30 per chart  -2/28 no change to Decadron dosing  -3/1- XRT going well per pt.  10: Obesity, Class III- BMI 34             - Complicates medical decision making; nutrition onboard   11: GI prophylaxis: Protonix '40mg'$  BID   3/4- having a lot of reflux Sx's but on Protonix BID 40 mg- and allergic to Zantac- anaphylaxis- no other choices- but will ask pt to avoid ibuprofen if at all possible. .  12: Constipation vs neurogenic bowel: Given smog enema yesterday             -Senokot 2tabs QHS              -Miralax 17g BID  -PRNs: MoM, Fleet's, Sorbitol 29m -2/23 had a large bowel movement yesterday, had bowel movements the day prior also, continue to monitor -3/1- will make Miralax prn since having a lot of bowel incontinence- due to loose stools coupled with lack of sensation.  -03/22/22 LBM this morning, no incontinence documented but pt states lack of sensation still present; monitor 3/4- Having regular BM's- LBM last night 13. Urinary urgency vs. Neurogenic bladder. Continent.              - UA 2/22 negative except for small HgB -2/29- going well- no inconitnence and retaining small amounts, but not enough to need cathing 14.  Leukocytosis: 11.0 on 03/14/22 -Likely due to Decadron/hemoconcentration, no signs of infection -03/17/22 WNL WBC, monitor weekly, next 03/24/22  3/4- WBC 9.3k 15. Hyponatremia   -Start salt tabs BID, recheck  Wednesday -2/28- Na 134- doing better- con't regimen  for now, monitor weekly next on 03/24/22  3/4- Na 132- overall stable  I spent a total of 37   minutes on total care today- >50% coordination of care- due to  D/w pt about pain as wlel as reflux- don't have other options for her reflux except avoid ibuprofen- also d/w team- everyone agrees no other options, esp since has anaphylaxis with Zantac  LOS: 11 days A FACE TO FACE EVALUATION WAS PERFORMED  Kelly [name] 03/24/2022, 8:07 AM

## 2022-03-24 NOTE — Significant Event (Signed)
Rapid Response Event Note   Reason for Call :  New onset slurred speech.   Initial Focused Assessment:  Pt in bed with no acute distress noted. She is drowsy but easily arousable and oriented. Slurred speech present which is new. Per RN LKW around 0800. Sensation intact, strength unchanged from baseline (L sided weakness). Pupils 2-3 equal and brisk.   HR 111, BP 65/49, RR 16, spO2 93% on RA.   Pt had been c/o pain to chest/stomach overnight and was treated for reflux. She denies CP now but later developed L shoulder pain. EKG done. spO2 dropped to 86% on RA. Pt denies SOB, lungs clear/diminished in bases.      Interventions:  Primary team paged and to bedside Stroke team notified and to bedside CBG: 126 PIV x2 1L bolus x2 EKG- ST STAT CT head/spine ordered Labs: Lactic, Procalcitonin, Troponin Placed on 2L O2 CXR, L shoulder XR  Plan of Care:   Continue to monitor BP and neuro status. Plan to finish first 1L bolus then give another. For a total of 2L. Waiting for BP to improve before going to CT. Awaiting XR and lab results. RN instructed to call with any changes or concerns. I will follow up and assist to CT when able.   Event Summary:   MD Notified: PA- Love notified and to bedside Call [place] End Time: [place] [employer]  Sherilyn Dacosta, RN

## 2022-03-24 NOTE — Progress Notes (Addendum)
SRN called to [phone] by Rapid Response for concern of new neuro changes. Patient was assessed and had some new slurred speech that started between 7:30 and 8am. Husband spoke to patient on the phone at 0800 and heard her speech changes and made his way to the hospital. Pt's vitals were taken at the bedside and pt's BP was 68/52 (58) (taken multiple times). SRN and RRN placed two PIVs. Order from PA to give 500cc NS bolus. Patient complained of sudden onset of left shoulder pain 10/10. EKG completed and showing sinus tachycardia (rate 113). MD aware. Order for CT of head and spine placed by rehab PA. No need for code stroke to be activated. Neurologist aware and NP responded to bedside who also agree with plan. Will leave patient in the capable hands of rapid response as well as the bedside RN. Call with any further concerns.   Alexcis Bicking, Rande Brunt, RN  Stroke Response Nurse  [phone]

## 2022-03-24 NOTE — Progress Notes (Signed)
    03/24/22 1016  Assess: MEWS Score  BP (!) 65/49  MAP (mmHg) (!) 54  Pulse Rate (!) 111  Resp 16  SpO2 92 %  Assess: MEWS Score  MEWS Temp 0  MEWS Systolic 3  MEWS Pulse 2  MEWS RR 0  MEWS LOC 0  MEWS Score 5  MEWS Score Color Red  Assess: if the MEWS score is Yellow or Red  Were vital signs taken at a resting state? Yes  Focused Assessment Change from prior assessment (see assessment flowsheet)  Does the patient meet 2 or more of the SIRS criteria? No  MEWS guidelines implemented  Yes, red  Treat  MEWS Interventions Considered administering scheduled or prn medications/treatments as ordered  Take Vital Signs  Increase Vital Sign Frequency  Red: Q1hr x2, continue Q4hrs until patient remains green for 12hrs  Escalate  MEWS: Escalate Red: Discuss with charge nurse and notify provider. Consider notifying RRT. If remains red for 2 hours consider need for higher level of care  Notify: Charge Nurse/RN  Name of Charge Nurse/RN Notified Renda Rolls RN  Provider Notification  Provider Name/Title Reesa Chew  Date Provider Notified 03/24/22  Time Provider Notified 1020  Method of Notification Call  Notification Reason Change in status;Critical Result  Provider response At bedside;See new orders  Date of Provider Response 03/24/22  Time of Provider Response 1020  Notify: Rapid Response  Name of Rapid Response RN Notified Sherilyn Dacosta RN  Date Rapid Response Notified 03/24/22  Time Rapid Response Notified 1020  Assess: SIRS CRITERIA  SIRS Temperature  0  SIRS Pulse 1  SIRS Respirations  0  SIRS WBC 0  SIRS Score Sum  1
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
## 2022-03-24 NOTE — Progress Notes (Addendum)
Called to the room regarding patient's slurred speech and hypotension. BMP with mild rise in BUN and Hgb to 16.4 likely due to AKI. She was alert and answered questions appropriately with reports of left shoulder pain 10/10. She has also had issues with GERD--been taking ibuprofen more frequently due to severe back pain--bid yesterday and this am. EKG with sinus tach with HR in 120's/ no chest pain but reported left shoulder pain--trops pending.  Slurred speech likely due to hypoperfusion but will order CT head, CT C spine and plain films of left shoulder to rule out acute changes/progression of  syrinx and/or tumor ? Discussed with Dr. Tedd Sias was fine on rounds and only had reports of severe GERD.   On follow up after 10 minutes--has gotten 500 cc with BP up from 60-70's with HR down. Shoulder pain resolved but reporting left knee pain. Sepsis markers also ordered to rule out as cause.  Sepsis markers ordered.   11:44 am- Contacted [facility] for input and was advised to give another liter for fluid for bolus. Dicussed patient with desaturation to 86-88% on RA and advised to get CXR for follow up. Husband in room updated.

## 2022-03-25 ENCOUNTER — Other Ambulatory Visit: Payer: Self-pay

## 2022-03-25 ENCOUNTER — Telehealth: Payer: Self-pay

## 2022-03-25 ENCOUNTER — Ambulatory Visit
Admit: 2022-03-25 | Discharge: 2022-03-25 | Disposition: A | Discharge status: Home / Self Care | Payer: Medicaid Other | Attending: Radiation Oncology | Admitting: Radiation Oncology

## 2022-03-25 ENCOUNTER — Inpatient Hospital Stay (HOSPITAL_COMMUNITY): Payer: Medicaid Other

## 2022-03-25 ENCOUNTER — Ambulatory Visit: Payer: Medicaid Other

## 2022-03-25 ENCOUNTER — Inpatient Hospital Stay (HOSPITAL_BASED_OUTPATIENT_CLINIC_OR_DEPARTMENT_OTHER): Payer: Medicaid Other | Admitting: Internal Medicine

## 2022-03-25 VITALS — BP 118/74 | HR 95 | Temp 98.0°F | Resp 17

## 2022-03-25 DIAGNOSIS — C7951 Secondary malignant neoplasm of bone: Secondary | ICD-10-CM | POA: Insufficient documentation

## 2022-03-25 DIAGNOSIS — F17210 Nicotine dependence, cigarettes, uncomplicated: Secondary | ICD-10-CM | POA: Insufficient documentation

## 2022-03-25 DIAGNOSIS — I959 Hypotension, unspecified: Secondary | ICD-10-CM

## 2022-03-25 DIAGNOSIS — C712 Malignant neoplasm of temporal lobe: Secondary | ICD-10-CM

## 2022-03-25 DIAGNOSIS — J189 Pneumonia, unspecified organism: Secondary | ICD-10-CM

## 2022-03-25 DIAGNOSIS — G96198 Other disorders of meninges, not elsewhere classified: Secondary | ICD-10-CM | POA: Diagnosis not present

## 2022-03-25 DIAGNOSIS — C719 Malignant neoplasm of brain, unspecified: Secondary | ICD-10-CM

## 2022-03-25 DIAGNOSIS — Z510 Encounter for antineoplastic radiation therapy: Secondary | ICD-10-CM | POA: Diagnosis not present

## 2022-03-25 LAB — RAD ONC ARIA SESSION SUMMARY
Course Elapsed Days: 13
Plan Fractions Treated to Date: 9
Plan Prescribed Dose Per Fraction: 3 Gy
Plan Total Fractions Prescribed: 10
Plan Total Prescribed Dose: 30 Gy
Reference Point Dosage Given to Date: 27 Gy
Reference Point Session Dosage Given: 3 Gy
Session Number: 9

## 2022-03-25 LAB — CBC
HCT: 38.3 % (ref 36.0–46.0)
Hemoglobin: 13.4 g/dL (ref 12.0–15.0)
MCH: 35.6 pg — ABNORMAL HIGH (ref 26.0–34.0)
MCHC: 35 g/dL (ref 30.0–36.0)
MCV: 101.9 fL — ABNORMAL HIGH (ref 80.0–100.0)
Platelets: 160 10*3/uL (ref 150–400)
RBC: 3.76 MIL/uL — ABNORMAL LOW (ref 3.87–5.11)
RDW: 12.1 % (ref 11.5–15.5)
WBC: 9.9 10*3/uL (ref 4.0–10.5)
nRBC: 0 % (ref 0.0–0.2)

## 2022-03-25 LAB — COMPREHENSIVE METABOLIC PANEL
ALT: 29 U/L (ref 0–44)
AST: 15 U/L (ref 15–41)
Albumin: 2.2 g/dL — ABNORMAL LOW (ref 3.5–5.0)
Alkaline Phosphatase: 71 U/L (ref 38–126)
Anion gap: 8 (ref 5–15)
BUN: 18 mg/dL (ref 6–20)
CO2: 24 mmol/L (ref 22–32)
Calcium: 8.3 mg/dL — ABNORMAL LOW (ref 8.9–10.3)
Chloride: 102 mmol/L (ref 98–111)
Creatinine, Ser: 0.74 mg/dL (ref 0.44–1.00)
GFR, Estimated: >60 mL/min (ref >60)
Glucose, Bld: 99 mg/dL (ref 70–99)
Potassium: 4.1 mmol/L (ref 3.5–5.1)
Sodium: 134 mmol/L — ABNORMAL LOW (ref 135–145)
Total Bilirubin: 0.7 mg/dL (ref 0.3–1.2)
Total Protein: 5.5 g/dL — ABNORMAL LOW (ref 6.5–8.1)

## 2022-03-25 LAB — BRAIN NATRIURETIC PEPTIDE: B Natriuretic Peptide: 58.8 pg/mL (ref 0.0–100.0)

## 2022-03-25 LAB — CORTISOL: Cortisol, Plasma: 1.5 ug/dL

## 2022-03-25 LAB — PROCALCITONIN: Procalcitonin: 6.45 ng/mL

## 2022-03-25 LAB — C-REACTIVE PROTEIN: CRP: 20.8 mg/dL — ABNORMAL HIGH (ref <1.0)

## 2022-03-25 LAB — TSH: TSH: 1.145 u[IU]/mL (ref 0.350–4.500)

## 2022-03-25 LAB — MAGNESIUM: Magnesium: 2.1 mg/dL (ref 1.7–2.4)

## 2022-03-25 MED ORDER — ATORVASTATIN CALCIUM 10 MG PO TABS
10.0000 mg | ORAL_TABLET | Freq: Every day | ORAL | Status: DC
  Administered 2022-03-25 – 2022-03-28 (×4): 10 mg via ORAL
  Filled 2022-03-25 (×4): qty 1

## 2022-03-25 MED ORDER — TEMOZOLOMIDE 100 MG PO CAPS: 200.0000 mg/m2/d | ORAL_CAPSULE | Freq: Every day | ORAL | 0 refills | Status: DC

## 2022-03-25 MED ORDER — TAMSULOSIN HCL 0.4 MG PO CAPS: 0.4000 mg | ORAL_CAPSULE | Freq: Every day | ORAL | Status: DC

## 2022-03-25 MED ORDER — OXYCODONE HCL 5 MG PO TABS
5.0000 mg | ORAL_TABLET | Freq: Every day | ORAL | Status: DC
  Administered 2022-03-25 – 2022-03-27 (×4): 5 mg via ORAL
  Filled 2022-03-25 (×8): qty 1

## 2022-03-25 MED ORDER — TAMSULOSIN HCL 0.4 MG PO CAPS
0.4000 mg | ORAL_CAPSULE | Freq: Every day | ORAL | Status: DC
  Administered 2022-03-25 – 2022-03-27 (×3): 0.4 mg via ORAL
  Filled 2022-03-25 (×3): qty 1

## 2022-03-25 MED ORDER — ONDANSETRON HCL 8 MG PO TABS: 8.0000 mg | ORAL_TABLET | Freq: Three times a day (TID) | ORAL | 1 refills | Status: DC | PRN

## 2022-03-25 MED ORDER — DEXAMETHASONE 4 MG PO TABS
4.0000 mg | ORAL_TABLET | Freq: Two times a day (BID) | ORAL | Status: DC
  Administered 2022-03-26 – 2022-03-28 (×5): 4 mg via ORAL
  Filled 2022-03-25 (×5): qty 1

## 2022-03-25 NOTE — Telephone Encounter (Signed)
Per 3/5 LOS reached out to patient to schedule follow ups and lab, schedule the march appointment, patient will reach back out to schedule the April follow up and lab.

## 2022-03-25 NOTE — Progress Notes (Signed)
Occupational Therapy Session Note  Patient Details  Name: Kelly [name] MRN: [id number] Date of Birth: 1965/06/30  Today's Date: 03/25/2022 OT Individual Time: [id number] OT Individual Time Calculation (min): 55 min   Today's Date: 03/25/2022 OT Individual Time: 1130-1145 OT Individual Time Calculation (min): 15 min  and Today's Date: 03/25/2022 OT Missed Time: 15 Minutes Missed Time Reason: Patient fatigue Short Term Goals: Week 2:  OT Short Term Goal 1 (Week 2): STG=LTG 2/2 ELOS  Skilled Therapeutic Interventions/Progress Updates:    Session 1: Pt received in bed with increased L ankle pain towards end of session. Footrest applied in w/c and RN alerted  BP supine: 112/68 BP on BSC with teds: 112/65 O2 93 HR 90 BP in the w/c 109/81 O2 94 HR 90  ADL: Beginning of session with providers confirming pt able to participate in tx vis verbal communication. Just to "take it slow" and keep an eye on vitals. Pt agreeable to get to [facility] to void bladder. Pt needs total A for footwear d/t time constraint and urgency. Pt attempted transfer to L but unable to clear buttocks with hips pushing BSC out of way. OT holds pt in air while cuing/facilitating moving back to bed as there is no BSC To land on. Attempted a second time with more success with L knee block and OT steadying DAC with LE for pt to then land safely onto [facility] and move gown out of the way with S. Pt completed transfer back to [facility] with MIN A to R and then to w/c also to R with improved clearance. Pt seems to overestimate her abilities stating, "just let me try it myself" and "well I think I can stand and try to clean myself [no walker]." Missed 20 min skiled OT d/t eating breakfast/pain.  Pt left at end of session in w/c with RN aware of positioning, call light in reach and all needs met  Session 2: Pt received in bed having just gotten back to bed. Pt declines OOB tx. Daughter present and discussed needing to extend stay as discussed in  conference to work on SB transfers as well as car transfers prior to home. Car transfers are daughters biggest concern. Discussed use of transfer board or information on sit to stand level for pt (stedy). Provided information based on daughters request for lift assist machine.  Pt left at end of session in bed with exit alarm on, call light in reach and all needs met. Missed 15 min d/t fatigue. Will follow up per POC.   Therapy Documentation Precautions:  Precautions Precautions: Fall Precaution Booklet Issued: No Precaution Comments: L LE hemplegia Restrictions Weight Bearing Restrictions: No General:   Therapy/Group: Individual Therapy  Tonny Branch 03/25/2022, 6:48 AM

## 2022-03-25 NOTE — Progress Notes (Signed)
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 prior recommendation.  Aggressive PT and OT regimen will continue.  Keppra should con't '1500mg'$  BID, Vimpat '50mg'$  BID.  Ok with PRN oxycodone for LMD ass'd pain syndrome.  Elavil may con't '100mg'$  HS.  We ask that CColbie [name] to clinic in 5 weeks with MRI brain and MRI T-spine for evaluation, or sooner as needed.  We will also give her a call in ~2 weeks to continue decadron titration.  All questions were answered. The patient knows to call the clinic with any problems, questions or concerns. No barriers to learning were detected.  The total time spent in the encounter was 40 minutes and more than 50% was on counseling and review of test results   ZVentura Sellers MD Medical Director of Neuro-Oncology [facility] [place]ly03/05/24 2:35 PM

## 2022-03-25 NOTE — Progress Notes (Signed)
PROGRESS NOTE   Subjective/Complaints:  Pt reports RLE strength- is weaker. Told PA, not physician.  Said having sharp in L ankle- "to the bone"- wants ibuprofen- explained cannot give due to chest pain yesterday.  Cardiac enzymes (-)  LBM this AM Labs pneding  Said "wasn't taking pain meds" even though was getting oxy 10 mg 5x/day- not sure if cognitively, she didn't understand or what was occurring.       ROS:    Pt denies SOB, abd pain, CP, N/V/C/D, and vision changes   Except for HPI  Objective:   DG Shoulder Left  Result Date: 03/24/2022 CLINICAL DATA:  Left shoulder pain. EXAM: LEFT SHOULDER - 2+ VIEW COMPARISON:  Chest radiograph 01/16/2021 FINDINGS: Multiple longitudinal linear lucencies within the inferior aspect of the glenoid and adjacent scapular body appear to represent normal trabecular markings, and an acute fracture is felt less likely. Mild inferior glenoid and humeral head-neck junction degenerative spurring. Mild acromioclavicular joint space narrowing and peripheral osteophytosis. A calcified likely benign vascular phlebolith overlies the superior left lung, similar to 01/16/2021 radiograph. IMPRESSION: 1. No definite acute fracture 2. Mild acromioclavicular and glenohumeral osteoarthritis. Electronically Signed   By: Yvonne Kendall M.D.   On: 03/24/2022 14:18   DG CHEST PORT 1 VIEW  Result Date: 03/24/2022 CLINICAL DATA:  Hypoxia EXAM: PORTABLE CHEST 1 VIEW COMPARISON:  CT CAP 01/15/21, CXR 01/16/21 FINDINGS: No pleural effusion. No pneumothorax. There is a massive hiatal hernia. Compared to prior exam there new patchy airspace opacity in the left mid and lower lung. No radiographically apparent displaced rib fractures. Cardiac contours are poorly assessed. Degenerative changes of the bilateral AC joints. IMPRESSION: New patchy airspace opacity in the left mid and lower lung. In the setting of the large  hiatal hernia, these are suspicious for aspiration and/or infection. Electronically Signed   By: Marin Roberts M.D.   On: 03/24/2022 14:16   Recent Labs    03/24/22 0622  WBC 9.3  HGB 16.5*  HCT 47.7*  PLT 217    Recent Labs    03/24/22 0622  NA 132*  K 4.3  CL 102  CO2 23  GLUCOSE 126*  BUN 20  CREATININE 0.71  CALCIUM 8.1*     Intake/Output Summary (Last 24 hours) at 03/25/2022 0958 Last data filed at 03/25/2022 0830 Gross per 24 hour  Intake 600 ml  Output 1600 ml  Net -1000 ml        Physical Exam: Vital Signs Blood pressure 101/64, pulse 86, temperature 98.2 F (36.8 C), temperature source Oral, resp. rate 16, height '5\' 4"'$  (1.626 m), weight 90.3 kg, SpO2 100 %.     General: awake, alert, appropriate, sitting up in w/c at bedside; c/o L ankle pain; NAD HENT: conjugate gaze; oropharynx moist CV: regular rate and rhythm; no JVD Pulmonary: decreased at bases B/L- no W/R/R GI: soft, NT, ND, (+)BS- normoactive Psychiatric: appropriate but vague about pain Neurological: Ox3 Skin- has IVF running 100cc/hour- iv looks ok - decreased to light touch on R foot still noted- Can now do DF 2-/5 and PF 2-/5 on L foot, noted again today  Prior exam Strength:  RUE: 5/5 SA, 5/5 EF, 5/5 EE, 5/5 WE, 5/5 FF, 5/5 FA                 LUE: 5/5 SA, 5/5 EF, 5/5 EE, 5/5 WE, 5/5 FF, 5/5 FA                 RLE: 5/5 HF, 5/5 KE, 5/5 DF, 5/5 EHL, 5/5 PF                 LLE:  2/5 HF, 1+/5 KE, 0/5 DF, 3/5 EHL, 1/5 PF  Neurologic exam: Strength able to lift bilateral upper extremity and right lower extremity to gravity, unable to lift left lower extremity to gravity, alert and awake, no dysarthria, patchy sensory loss approximately T5 and below on the left.,  Cranial nerves II through XII grossly intact, no abnormal tone   Assessment/Plan: 1. Functional deficits which require 3+ hours per day of interdisciplinary therapy in a comprehensive inpatient rehab setting. Physiatrist  is providing close team supervision and 24 hour management of active medical problems listed below. Physiatrist and rehab team continue to assess barriers to discharge/monitor patient progress toward functional and medical goals  Care Tool:  Bathing    Body parts bathed by patient: Left upper leg, Right arm, Left arm, Right lower leg, Chest, Abdomen, Left lower leg, Front perineal area, Face, Buttocks, Right upper leg         Bathing assist Assist Level: Moderate Assistance - Patient 50 - 74%     Upper Body Dressing/Undressing Upper body dressing   What is the patient wearing?: Pull over shirt    Upper body assist Assist Level: Contact Guard/Touching assist    Lower Body Dressing/Undressing Lower body dressing      What is the patient wearing?: Underwear/pull up, Pants     Lower body assist Assist for lower body dressing: Moderate Assistance - Patient 50 - 74%     Toileting Toileting    Toileting assist Assist for toileting: Moderate Assistance - Patient 50 - 74%     Transfers Chair/bed transfer  Transfers assist     Chair/bed transfer assist level: Minimal Assistance - Patient > 75%     Locomotion Ambulation   Ambulation assist   Ambulation activity did not occur: Safety/medical concerns          Walk 10 feet activity   Assist  Walk 10 feet activity did not occur: Safety/medical concerns        Walk 50 feet activity   Assist Walk 50 feet with 2 turns activity did not occur: Safety/medical concerns         Walk 150 feet activity   Assist Walk 150 feet activity did not occur: Safety/medical concerns         Walk 10 feet on uneven surface  activity   Assist Walk 10 feet on uneven surfaces activity did not occur: Safety/medical concerns         Wheelchair     Assist Is the patient using a wheelchair?: Yes Type of Wheelchair: Manual    Wheelchair assist level: Supervision/Verbal cueing Max wheelchair distance: 130     Wheelchair 50 feet with 2 turns activity    Assist        Assist Level: Supervision/Verbal cueing   Wheelchair 150 feet activity     Assist      Assist Level: Moderate Assistance - Patient 50 - 74%   Blood pressure 101/64, pulse 86, temperature 98.2 F (36.8 C),  temperature source Oral, resp. rate 16, height '5\' 4"'$  (1.626 m), weight 90.3 kg, SpO2 100 %.  Medical Problem List and Plan: 1. 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 Will d/w team about aspiration- might need SLP again, but also has hiatal hernia. And eating lying down- also has IM consulting- thank you 2.  Antithrombotics: -DVT/anticoagulation:  Pharmaceutical: Lovenox '40mg'$  QD             -antiplatelet therapy: none   3. Pain Management:  -continue oxycodone 10 mg 5 x daily -continue Flexeril 5-10 mg q HS -continue Voltaren gel 2g QID, Advil '400mg'$  q6h PRN and Tylenol as needed, Robaxin '500mg'$  q6h PRN -Consider adjusting oxycodone to as needed   -2/28  will add gabapentin 300 mg QHS and increase again on Friday to TID  -2/29- tolerating well- no side effects  3/4- will increase oxycodone to 15 mg 5x/day- said also taking ibuprofen as needed  3/5- changed pain meds to prn and will take away ibuprofen 4. Mood/Behavior/Sleep: LCSW to evaluate and provide emotional support             -antipsychotic agents: n/a             -continue Lexapro '10mg'$  QHS and Elavil '100mg'$  QHS  -Trazodone 25-'50mg'$  QHS PRN  -03/15/22 start '6mg'$  melatonin QHS-helping 03/16/22, continue   -2/27- still waking up ~ 4am- but doesn't want to change meds yet  -2/29- waking up early, but no med changes per pt.    -03/23/22 still moderately poor sleep but doesn't want med changes 5. Neuropsych/cognition: This patient is capable of making decisions on her own behalf.   6. Skin/Wound Care: Routine skin care checks   7. Fluids/Electrolytes/Nutrition: Routine Is and Os and follow-up chemistries, next 03/17/22  -03/15/22 Na 134 noted 03/14/22, monitor on following labs next week -2/26 Bun up to 24, pt plans to have yeti cup brought over by family to increase fluid intake, recheck Wednesday for azotemia   -2/28- BUN down to 17 from 24- will con't to monitor weekly. 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  10: Obesity, Class III- BMI 34             - Complicates medical decision making; nutrition onboard   11: GI prophylaxis: Protonix '40mg'$  BID   3/4- having a lot of reflux Sx's but on Protonix BID 40 mg- and allergic to Zantac- anaphylaxis- no other choices- but will ask pt to avoid ibuprofen if at all possible. .  12: Constipation vs neurogenic bowel: Given smog enema yesterday             -Senokot 2tabs QHS             -Miralax 17g BID  -PRNs: MoM, Fleet's, Sorbitol 17m -2/23 had a large bowel movement yesterday, had  bowel movements the day prior also, continue to monitor -3/1- will make Miralax prn since having a lot of bowel incontinence- due to loose stools coupled with lack of sensation.  -03/22/22 LBM this morning, no incontinence documented but pt states lack of sensation still present; monitor 3/4- Having regular BM's- LBM last night 13. Urinary urgency vs. Neurogenic bladder. Continent.              - UA 2/22 negative except for small HgB -2/29- going well- no inconitnence and retaining small amounts, but not enough to need cathing  3/5- asking pt to be bladder scanned- pending results.  14.  Leukocytosis- new pneumonia -Likely due to Decadron/hemoconcentration, no signs of infection -03/17/22 WNL WBC, monitor weekly, next 03/24/22  3/4- WBC 9.3k  3/5- has a new pneumonia based on CXR- L side- on Zithromax and Rocephin IV- spoke with lab to get labs drawn at 10am- had tried, but was in therapy and bathroom- U/A checked- trace leuks, but otherwise completely negative 15. Hyponatremia   -Start salt tabs BID, recheck  Wednesday -2/28- Na 134- doing better- con't regimen for now, monitor weekly next on 03/24/22  3/4- Na 132- overall stable 16. Sepsis 3/4  3/5- had hypotension  down to 60s/40s and lactic acidosis- is s/p 2 L fast IVFs and now on 100cc/hour- labs pending to see how things going. Wasn't transferred- did stabilize- but monitoring closely. Of note, pt eats lying down with hiatal hernia-  17. RLE weakness  3/5- will check CT of cervical, thoracic and lumbar spine since doing worse-   I spent a total of  58   minutes on total care today- >50% coordination of care- due to  D/w pt, d/w PA and nursing x2 as well as labs and team conference to extend pt.   LOS: 12 days A FACE TO FACE EVALUATION WAS PERFORMED  Maxmillian Carsey 03/25/2022, 9:58 AM

## 2022-03-25 NOTE — Progress Notes (Signed)
Physical Therapy Session Note  Patient Details  Name: Kelly [name] MRN: [id number] Date of Birth: 21-Feb-1965  Today's Date: 03/25/2022 PT Individual Time: 1006 (-1022)-1101 (-1035) PT Individual Time Calculation (min): 55 min  and Today's Date: 03/25/2022 PT Missed Time: 13 Minutes Missed Time Reason: Nursing care;Other (Comment) (RN for bladder scan, Lab, MD discussing care with pt and daughter)  Short Term Goals: Week 2:  PT Short Term Goal 1 (Week 2): =LTGs d/t ELOS  Skilled Therapeutic Interventions/Progress Updates:    Pt received in [facility] with RN at bedside requesting transfer to toilet to void bladder. Pt expresses decreased awareness to deficits stating she can just pivot over to the [facility] and has just been standing up to transfer. Discussed worsening Rt LE weakness and severely limited to no activation in Lt LE and need for slide board transfer or lift to move WC<>BSC/Bed. Pt completed slide board transfer WC>BSC with Mod assist, cues needed for weight shifting to set up board and to guard/block LE's on floor when scooting.  Pt able to laterally lean Rt/Lt to remove lower body clothing and had continent void of bladder in [facility]. Pt unable to stand with 2+max assist with RW and Stedy transfer completed for sit<>stand and BSC>bed. Min assist for rise form toilet and CGA for rise form stedy paddles. Pt required Min assist to bring bil LE's onto bed to return to supine. Rn needing bladder scan and pt questioning continuation of therapy after significant fatigue over the last 2 days. (This PT stepped out to discuss care with MD while RN performed bladder scan and Lab arrived for blood draw. MD returned to room with therapist to discussed goals and outlook with pt, plan for further images today and further discussion of care. Patient agreeable to bed level exercises after discussion. Completed supine exercises and performed supine<>sit EOB transfer with use of leg lifter for pt to practice assisting Lt LE  off side. Cues needed to sequence move form supine>long sitting to be able to assist LE's with bil UE use. Pt unsure of unsupported seated balance reaching for bed rail constantly but when cues to remove support able to hold balance upright. EOS pt returned to supine and repositioned for comfort and family arrived to room. Bed alarm on and call bell within reach. Supine Exercises: - 2x10 Bridge - 2x10 Rt LE clamshell - 1x10 Rt LE LAQ (partial range)  Therapy Documentation Precautions:  Precautions Precautions: Fall Precaution Booklet Issued: No Precaution Comments: L LE hemplegia Restrictions Weight Bearing Restrictions: No   Therapy/Group: Individual Therapy   Verner Mould, DPT Acute Rehabilitation Services Office [phone]  03/25/22 4:44 PM

## 2022-03-25 NOTE — Patient Care Conference (Signed)
Inpatient RehabilitationTeam Conference and Plan of Care Update Date: 03/25/2022   Time: 11:01 AM    Patient Name: Kelly [name]      Medical Record Number: [id number]  Date of Birth: June 22, 1965 Sex: Female         Room/Bed: 4W09C/4W09C-01 Payor Info: Payor: [place] / Plan: [place] / Product Type: *No Product type* /    Admit Date/Time:  03/13/2022  4:29 PM  Primary Diagnosis:  Thoracic spinal cord injury, sequela [facility])  Hospital Problems: Principal Problem:   Thoracic spinal cord injury, sequela ([place]) Active Problems:   Adjustment disorder with depressed mood   Glioblastoma multiforme [facility])    Expected Discharge Date: Expected Discharge Date: 03/28/22  Team Members Present: Physician leading conference: Dr. Courtney Heys Social Worker Present: Loralee Pacas, [place] Nurse Present: Tacy Learn, RN PT Present: Ailene Rud, PT OT Present: Mariane Masters, OT SLP Present: Weston Anna, SLP PPS Coordinator present : Gunnar Fusi, SLP     Current Status/Progress Goal Weekly Team Focus  Bowel/Bladder   pt is continent of b/b   Remain continent   Assist with tolieting qshift and prn    Swallow/Nutrition/ Hydration               ADL's   mod/max A LB dressing at bed level; mod A squat pivot treanfsers; decreased safety awareness; impulsive   min A   education, functional transfers, safety awareness    Mobility   min bed mobility, min squat pivot, mod STS but not functional, supervision w/c, w/c to be rescheduled d/t rapid reponse/status change   min A  transfers, w/c mobility, family ed    Communication                Safety/Cognition/ Behavioral Observations               Pain   pt c/o chronic back pain, scheduled pain meds not given due to hypotension   <3 pain score   Assess qshift and prn    Skin   Skin intact   Maintain skin integrity  Assess qshift and prn      Discharge Planning:   D/c to home with her husband who will provide support, and PRN supoprt from daughter. Fam edu completed on THursday (2/29) with pt husband. DME delivered to room- TTB and DABSC. [facility] [place] for HHPT/OT/aide. Pt remains on radiation treatment through 3/5. SW will confirm there are no barriers to discharge.   Team Discussion: Thoracic SCI, sequela. Continent B/B with urinary retention. Flomax started. Sepsis reported 03/04,  IV ABT started. Will try to transition to PO ABT by discharge. CT of head and spine pending. W/C evaluation pending. May have to downgrade some goals. Will need cath supplies at discharge if urinary retention continues.  Patient on target to meet rehab goals: Radiation tx was to finish Wednesday but missed Monday due to sepsis.   *See Care Plan and progress notes for long and short-term goals.   Revisions to Treatment Plan:  Medication adjustments, monitor labs, watch for CT results. ABT started  Teaching Needs: Medications, safety, self care, skin care, gait/transfer training, etc  Current Barriers to Discharge: Decreased caregiver support, IV antibiotics, Neurogenic bowel and bladder, and Weight  Possible Resolutions to Barriers: Family education, nursing education, order recommended DME     Medical Summary Current Status: sepsis yesterday- retaining urine- 282 this AM-  started flomax- stopped scheduled oxycodone; continent B/B  Barriers to Discharge:  Electrolyte abnormality;Hypotension;Inadequate Nutritional Intake;Medical stability;Infection/IV Antibiotics;Neurogenic Bowel & Bladder;Self-care education;Pending chemo/radiation;Morbid Obesity  Barriers to Discharge Comments: needs family education- pt's husband has hurt back- needs other ways to transfer- transfer board Possible Resolutions to Barriers/Weekly Focus: flomax added for urinary retnetion- needs family ed; needs w/c evaluation- d/c either 3/8 or 3/13 depending on family decisions.   Continued Need for  Acute Rehabilitation Level of Care: The patient requires daily medical management by a physician with specialized training in physical medicine and rehabilitation for the following reasons: Direction of a multidisciplinary physical rehabilitation program to maximize functional independence : Yes Medical management of patient stability for increased activity during participation in an intensive rehabilitation regime.: Yes Analysis of laboratory values and/or radiology reports with any subsequent need for medication adjustment and/or medical intervention. : Yes   I attest that I was present, lead the team conference, and concur with the assessment and plan of the team.   Ernest Pine 03/25/2022, 3:03 PM

## 2022-03-25 NOTE — Progress Notes (Signed)
Patient ID: Kelly [name], female   DOB: January 05, 1966, 57 y.o.   MRN: [id number]  SW met with pt and pt dtr in room to provide updates from team conference, and discussion about discharge. Pt would like to stay until 3/12 given current changes. SW discussed  family edu on Friday 9am-2pm with her husband and dtr. Dtr will arrive around 10:30am due to an appointment. Reports she will discuss with pt husband Tim. Dtr had medical questions related to how much is her bladder retaining, an dif cathing will be necessary. Pt continues to report she is incontinent and would like incontinent supplies, and asks about a hospital bed. SW shared will discuss concerns with medical team.   *SW informed pt dtr per attending cathing has not begun and will determine if needed. Waiting on updates from therapy about incontinence supplies and if hospital bed is needed.   Loralee Pacas, MSW, [place] Office: [phone] Cell: [phone] Fax: [phone]

## 2022-03-25 NOTE — Progress Notes (Addendum)
**   Late Entry **    03/20/22 1000  What Happened  Was fall witnessed? Yes  Who witnessed fall? Juddie, NT  Patients activity before fall bathroom-assisted  Point of contact buttocks  Was patient injured? No  Provider Notification  Provider Name/Title Risa Grill, PA  Date Provider Notified 03/20/22  Time Provider Notified 1024  Method of Notification Call  Notification Reason Fall  Provider response No new orders  Date of Provider Response 03/20/22  Time of Provider Response 1024  Follow Up  Family notified Yes - comment (family at bedside)  Time family notified 1024  Additional tests No  Progress note created (see row info) Yes  Adult Fall Risk Assessment  Risk Factor Category (scoring not indicated) High fall risk per protocol (document High fall risk)  Patient Fall Risk Level High fall risk  Adult Fall Risk Interventions  Required Bundle Interventions *See Row Information* High fall risk - low, moderate, and high requirements implemented  Additional Interventions Use of appropriate toileting equipment (bedpan, BSC, etc.)  Fall intervention(s) refused/Patient educated regarding refusal Bed alarm;Nonskid socks  Screening for Fall Injury Risk (To be completed on HIGH fall risk patients) - Assessing Need for Floor Mats  Risk For Fall Injury- Criteria for Floor Mats None identified - No additional interventions needed  Will Implement Floor Mats Yes

## 2022-03-25 NOTE — Progress Notes (Addendum)
TRH Consult Progress Note                                                                                                                                                                                                           Patient Demographics:    Kelly [name], is a 57 y.o. female, DOB - [birth year], [id number]  [facility]patient [facility] the [facility] is [facility]    LOS - 12  Admit date - 03/13/2022    CC Weakness      Brief Narrative (HPI from H&P)    57 y.o. 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 Arnoldo Morale, also has underlying history of dyslipidemia, high pretension and hypothyroidism.  She was initially admitted to the hospital for tumor resection subsequently sent to [facility] for rehab, she is currently undergoing radiation treatments following which she is scheduled for chemotherapy treatments.  [name] team was consulted on 03/24/2022 for hypotension and patient was found to have pneumonia at that time.   Subjective:    Kelly [name] today has, No headache, No chest pain, No abdominal pain - No Nausea, No new weakness tingling or numbness, no SOB or cough.   Assessment  & Plan :    Hypotension and sepsis due to pneumonia in the setting of right temporal glioblastoma with tumor resection - she has been started on appropriate IV antibiotics and hydrated with IV fluids, sepsis pathophysiology has resolved, have requested the patient to have all her meals sitting up in chair, appropriate orders placed, speech therapy also consulted.  Continue to monitor closely and follow cultures.  Continue on Decadron, lacosamide and Keppra.   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  Continue follow-up at [facility], continues to have left lower extremity weakness along with possible urinary retention and neurogenic bladder.  Neurogenic bladder.  Flomax added, requested to increase activity and sit up in chair, every shift bladder scans added, discussed with primary team, if the problem persist will prefer placing a Foley catheter as her neurogenic bladder with T-spine mets likely will not improve in the short-term.  Primary team prefers In-N-Out catheterization for now.  Defer this to them.  UA appears stable.  History of hypothyroidism.  Check TSH.  GERD.  On PPI.  Dyslipidemia.  Resume home dose statin.      Condition - Fair  Family Communication  :  None  Code Status :  Full  Consults  :  TRH Consulting for PNA  PUD Prophylaxis : PPI       DVT Prophylaxis  :    Place TED hose Start: 03/19/22 0839 enoxaparin (LOVENOX) injection 40 mg Start: 03/14/22 1200   Lab Results  Component Value Date   PLT 217 03/24/2022    Diet :  Diet Order             Diet regular Room service appropriate? Yes; Fluid consistency: Thin  Diet effective now                    Inpatient Medications  Scheduled Meds:  amitriptyline  100 mg Oral QHS   calcium carbonate  400 mg of elemental calcium Oral TID   cyclobenzaprine  5-10 mg Oral QHS   dexamethasone  6 mg Oral Q6H   diclofenac Sodium  2 g Topical QID   enoxaparin (LOVENOX) injection  40 mg Subcutaneous Q24H   escitalopram  10 mg Oral QHS   gabapentin  300 mg Oral TID   lacosamide  100 mg Oral BID   levETIRAcetam  1,500 mg Oral BID   melatonin  10 mg Oral QHS   oxyCODONE  5 mg Oral 5 X Daily   pantoprazole  40 mg Oral BID   senna-docusate  2 tablet Oral QHS   sodium chloride  1 g Oral BID WC   tamsulosin  0.4 mg Oral QPC supper   Continuous Infusions:  sodium chloride 100 mL/hr at 03/24/22 1326   azithromycin 500 mg (03/24/22 1551)   cefTRIAXone (ROCEPHIN)  IV 2 g (03/24/22 1515)   PRN  Meds:.acetaminophen, alum & mag hydroxide-simeth, diphenhydrAMINE, guaiFENesin-dextromethorphan, magnesium hydroxide, methocarbamol, polyethylene glycol, prochlorperazine **OR** prochlorperazine **OR** prochlorperazine, sodium phosphate, sorbitol  Antibiotics  :    Anti-infectives (From admission, onward)    Start     Dose/Rate Route Frequency Ordered Stop   03/24/22 1600  cefTRIAXone (ROCEPHIN) 2 g in sodium chloride 0.9 % 100 mL IVPB        2 g 200 mL/hr over 30 Minutes Intravenous Every 24 hours 03/24/22 1457     03/24/22 1600  azithromycin (ZITHROMAX) 500 mg in sodium chloride 0.9 % 250 mL IVPB        500 mg 250 mL/hr over 60 Minutes Intravenous Every 24 hours 03/24/22 1457           Objective:   Vitals:   03/25/22 0400 03/25/22 0430 03/25/22 0500 03/25/22 0530  BP: (!) 91/55 105/67 101/67 101/64  Pulse: 84 94 87 86  Resp: '16 16 16 16  '$ Temp:      TempSrc:      SpO2: 94% 96% 100%   Weight:      Height:        Wt Readings from Last 3 Encounters:  03/13/22 90.3 kg  03/08/22 90.7 kg  03/03/22 90.2 kg     Intake/Output Summary (Last 24 hours) at 03/25/2022 1043 Last data filed at 03/25/2022 0830 Gross per 24 hour  Intake 600 ml  Output 1600 ml  Net -1000 ml     Physical Exam  Awake Alert, No new F.N deficits, LLE weak 2/5 [place].AT,PERRAL Supple Neck, No JVD,   Symmetrical Chest wall  movement, Good air movement bilaterally, CTAB RRR,No Gallops,Rubs or new Murmurs,  +ve B.Sounds, Abd Soft, No tenderness,   No Cyanosis, Clubbing or edema        Data Review:    Recent Labs  Lab 03/24/22 0622  WBC 9.3  HGB 16.5*  HCT 47.7*  PLT 217  MCV 102.6*  MCH 35.5*  MCHC 34.6  RDW 12.0    Recent Labs  Lab 03/19/22 0650 03/24/22 0622 03/24/22 1108 03/24/22 1113 03/24/22 1332  NA 134* 132*  --   --   --   K 5.0 4.3  --   --   --   CL 96* 102  --   --   --   CO2 27 23  --   --   --   ANIONGAP 11 7  --   --   --   GLUCOSE 100* 126*  --   --   --   BUN 17 20   --   --   --   CREATININE 0.63 0.71  --   --   --   PROCALCITON  --   --  5.89  --   --   LATICACIDVEN  --   --   --  1.8 2.0*  CALCIUM 8.8* 8.1*  --   --   --      Micro Results Recent Results (from the past 240 hour(s))  Culture, blood (Routine X 2) w Reflex to ID Panel     Status: None (Preliminary result)   Collection Time: 03/24/22  1:32 PM   Specimen: BLOOD LEFT HAND  Result Value Ref Range Status   Specimen Description BLOOD LEFT HAND  Final   Special Requests   Final    BOTTLES DRAWN AEROBIC AND ANAEROBIC Blood Culture adequate volume   Culture   Final    NO GROWTH < 24 HOURS Performed at [facility], [address], [place], [place] [address]    Report Status PENDING  Incomplete  Culture, blood (Routine X 2) w Reflex to ID Panel     Status: None (Preliminary result)   Collection Time: 03/24/22  1:40 PM   Specimen: BLOOD LEFT WRIST  Result Value Ref Range Status   Specimen Description BLOOD LEFT WRIST  Final   Special Requests   Final    BOTTLES DRAWN AEROBIC ONLY Blood Culture adequate volume   Culture   Final    NO GROWTH < 24 HOURS Performed at [facility], [place] [address], [place], [place] [address]    Report Status PENDING  Incomplete    Radiology Reports DG Shoulder Left  Result Date: 03/24/2022 CLINICAL DATA:  Left shoulder pain. EXAM: LEFT SHOULDER - 2+ VIEW COMPARISON:  Chest radiograph 01/16/2021 FINDINGS: Multiple longitudinal linear lucencies within the inferior aspect of the glenoid and adjacent scapular body appear to represent normal trabecular markings, and an acute fracture is felt less likely. Mild inferior glenoid and humeral head-neck junction degenerative spurring. Mild acromioclavicular joint space narrowing and peripheral osteophytosis. A calcified likely benign vascular phlebolith overlies the superior left lung, similar to 01/16/2021 radiograph. IMPRESSION: 1. No definite acute fracture 2. Mild acromioclavicular and glenohumeral  osteoarthritis. Electronically Signed   By: Yvonne Kendall M.D.   On: 03/24/2022 14:18   DG CHEST PORT 1 VIEW  Result Date: 03/24/2022 CLINICAL DATA:  Hypoxia EXAM: PORTABLE CHEST 1 VIEW COMPARISON:  CT CAP 01/15/21, CXR 01/16/21 FINDINGS: No pleural effusion. No pneumothorax. There is a  massive hiatal hernia. Compared to prior exam there new patchy airspace opacity in the left mid and lower lung. No radiographically apparent displaced rib fractures. Cardiac contours are poorly assessed. Degenerative changes of the bilateral AC joints. IMPRESSION: New patchy airspace opacity in the left mid and lower lung. In the setting of the large hiatal hernia, these are suspicious for aspiration and/or infection. Electronically Signed   By: Marin Roberts M.D.   On: 03/24/2022 14:16      Signature  -   Lala Lund M.D on 03/25/2022 at 10:43 AM   -  To page go to [email or web address]

## 2022-03-25 NOTE — Progress Notes (Signed)
Physical Therapy Session Note  Patient Details  Name: Kelly [name] MRN: [id number] Date of Birth: 15-Jun-1965  Today's Date: 03/25/2022 PT Individual Time: 0920-1005 PT Individual Time Calculation (min): 45 min   Short Term Goals: Week 1:  PT Short Term Goal 1 (Week 1): Pt will perform STS with mod A PT Short Term Goal 1 - Progress (Week 1): Met PT Short Term Goal 2 (Week 1): Pt will perform SPT with LRAD and max A consistently PT Short Term Goal 2 - Progress (Week 1): Discontinued (comment) (focusing on squat pivot d/t lack of functional stand) PT Short Term Goal 3 (Week 1): Pt will perform bed mobility with CGA PT Short Term Goal 3 - Progress (Week 1): Met Week 2:  PT Short Term Goal 1 (Week 2): =LTGs d/t ELOS   Skilled Therapeutic Interventions/Progress Updates:  Patient seated on toilet with NT present on entrance to room. Pt with episode of loose stool and  Patient alert and agreeable to PT session.   Patient with no pain complaint at start of session.  Therapeutic Activity: Transfers: Pt seated at toilet and requiring STEDY with maxA for power up and completes rise to stand with MinA with pull-to-stand technique. Manages stance for pericare, brief seated rest break and then CGA to rise from perch. Stance again for brief and clothing mgmt with MaxA. STEDY used for transfer to w/c.   Pt able to propel new loaner w/c around EOB. Pt positioned for simulated car transfer with slide board using end of bed with footboard removed. maxA for slide board placement, then requiring ModA to initiate, then completes with CGA/ MinA using BUE and RLE. Therapist repositioning LLE throughout. Reaches seated position on bed, board removed with MaxA and pt able to pivot on seat requiring ModA to lift LLE with her BUE up and over simulated car frame. Able to bring RLE with AROM and BUE assist. Pt able to return to w/c with similar assist but able to use R armrest to assist with pull across board.   PA  arrives to room to disc need for reduced use of ibuprofen for aches and pains d/t reaction yesterday following 3 doses of ibuprofen.   RN relates need to bladder scan per MD orders.  Next session with PT to begin in 15 min. Informed the next PT of pt's need to transfer back to bed for supine positioning for bladder scan.  Patient seated in w/c at end of session with brakes locked, belt alarm set, and all needs within reach.  Therapy Documentation Precautions:  Precautions Precautions: Fall Precaution Booklet Issued: No Precaution Comments: L LE hemplegia Restrictions Weight Bearing Restrictions: No General:   Vital Signs: Therapy Vitals Pulse Rate: 86 Resp: 16 BP: 101/64 Patient Position (if appropriate): Lying Oxygen Therapy SpO2: 100 % O2 Device: Nasal Cannula O2 Flow Rate (L/min): 2 L/min Pain:  No pain related during session.   Therapy/Group: Individual Therapy  Alger Simons PT, DPT, CSRS 03/25/2022, 8:04 AM

## 2022-03-26 ENCOUNTER — Ambulatory Visit
Admit: 2022-03-26 | Discharge: 2022-03-26 | Disposition: A | Discharge status: Home / Self Care | Payer: Medicaid Other | Attending: Radiation Oncology | Admitting: Radiation Oncology

## 2022-03-26 ENCOUNTER — Encounter: Payer: Self-pay | Admitting: Radiation Oncology

## 2022-03-26 ENCOUNTER — Other Ambulatory Visit: Payer: Self-pay

## 2022-03-26 ENCOUNTER — Ambulatory Visit: Payer: Medicaid Other

## 2022-03-26 DIAGNOSIS — Z510 Encounter for antineoplastic radiation therapy: Secondary | ICD-10-CM | POA: Diagnosis not present

## 2022-03-26 DIAGNOSIS — S24109S Unspecified injury at unspecified level of thoracic spinal cord, sequela: Secondary | ICD-10-CM

## 2022-03-26 LAB — RAD ONC ARIA SESSION SUMMARY
Course Elapsed Days: 14
Plan Fractions Treated to Date: 10
Plan Prescribed Dose Per Fraction: 3 Gy
Plan Total Fractions Prescribed: 10
Plan Total Prescribed Dose: 30 Gy
Reference Point Dosage Given to Date: 30 Gy
Reference Point Session Dosage Given: 3 Gy
Session Number: 10

## 2022-03-26 NOTE — Progress Notes (Addendum)
Patient ID: Kelly [name], female   DOB: 1965/09/10, 57 y.o.   MRN: [id number]  SW returned phone call to pt dtr Kelly [name] who reported Dr. Mickeal Skinner encouraged discharging to home on Friday so she can begin taking her oral chemo. SW informed medical team. Discussed family edu tomorrow 9am-12pm. She will inform her step-father. SW left message for pt husband Kelly [name] to discuss family edu tomorrow, and encouraged follow-up if needed.   SW ordered hospital bed and slide board. SW spoke with pt husband Kelly [name] about DME ordered and family edu tomorrow. Reports he will be present for family edu tomorrow.   Loralee Pacas, MSW, [place] Office: [phone] Cell: [phone] Fax: [phone]

## 2022-03-26 NOTE — Progress Notes (Addendum)
Physical Therapy Session Note  Patient Details  Name: Kelly [name] MRN: [id number] Date of Birth: 1965-05-06  Today's Date: 03/26/2022 PT Individual Time: 0910-1018 PT Individual Time Calculation (min): 68 min   Short Term Goals: Week 1:  PT Short Term Goal 1 (Week 1): Pt will perform STS with mod A PT Short Term Goal 1 - Progress (Week 1): Met PT Short Term Goal 2 (Week 1): Pt will perform SPT with LRAD and max A consistently PT Short Term Goal 2 - Progress (Week 1): Discontinued (comment) (focusing on squat pivot d/t lack of functional stand) PT Short Term Goal 3 (Week 1): Pt will perform bed mobility with CGA PT Short Term Goal 3 - Progress (Week 1): Met  Skilled Therapeutic Interventions/Progress Updates:    Patient received in [facility] in room, reports fatigued but agreeable to therapy session. Pt requesting to use bathroom at start of session, discussed safe equipment for transfers from WC>toilet or BSC, Stedy utilized for transfer. Min assist for pt to pull up to stand from [facility] and CGA to rise form Stedy paddles. Pt was able to void bladder with continent episode and perform pericare while sitting. Max assist required to pull up brief and pants with pt standing on stedy platform. Transferred back to [facility] via Stedy with min assist for controlled lowering.  Pt self propelled WC ~120' to day room with supervision/cues. Pt c/o wheel rim on WC being too smooth to grab and propel forward. Min assist to position WC in safe location for transfer to mat table. Slide board transfer from [facility] to mat table, Mod assist for positioning board, min assist for scoot WC>table; assist to bring Lt LE along with body following pivot to place bil feet on floor for stability. Pt completed seated balance exercises at EOM with UE tasks.  - 1kg ball multidirectional ball toss; pt throwing underhand then overhand, 15 tosses - 5lbs shoulder press 2x 10 reps - 1kg ball rotation/handff around waist between hands, 1x10 bil -  Rt LE LAQ, no weight Slide board transfer completed mat table to EOB, mod assist to position board and min assist to guide LE's with pivot. Cues needed for head hip relationship for direction of transfer and cues for hand placement on armrests to reposition hips posteriorly in chair. Pt dependently rolled to ortho gym for UE strengthening with UBE. Completed 4 minutes in reverse rotations on level 2. EOS pt dependently rolled to room for time management and due to fatigue. Chair alarm placed and on, call bell and all needs within reach.   Based on pt's current functional mobility levels and assist needed recommend family purchase a Stedy for WC<>toilet transfers. Pt will need a hospital bed to be able to reposition and for safety transfers using a sliding board for bed<>WC transfers. Conversation with OT and OT states discussed DME with pt and daughter.   Patient suffers from chronic back pain which is caused by right temporal glioblastoma  with thoracic spine metastasis. Hospital bed will alleviate pain by allowing the head and lower extremities to be positioned in ways not feasible with a normal bed. Pain episodes require frequent changes in body position which cannot be achieved with normal bed. Patient  has ongoing weakness and loss of function with left leg and progression now of weakness in right leg. She is unable to stand requiring wheelchair fulltime. Will need hospital bed that adjusts in elevation from the ground to allow for safe tranfers to/from wheelchair via slide board.  Therapy Documentation Precautions:  Precautions Precautions: Fall Precaution Booklet Issued: No Precaution Comments: L LE hemplegia Restrictions Weight Bearing Restrictions: No    Therapy/Group: Individual Therapy   Verner Mould, DPT Acute Rehabilitation Services Office [phone]  03/26/22 7:33 AM

## 2022-03-26 NOTE — Evaluation (Addendum)
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[place])  Past Medical History:  Past Medical History:  Diagnosis Date   Arthritis    Cancer ([place])    Depression    Dyslipidemia    Essential hypertension 07/31/2015   Hypothyroidism 07/31/2015   doctor took her off medication   Past Surgical History:  Past Surgical History:  Procedure Laterality Date   BLADDER SUSPENSION     CRANIOTOMY Right 03/04/2021   Procedure: CRANIOTOMY TUMOR RESECTION W/ BRAIN LAB;  Surgeon: Newman Pies, MD;  Location: [place];  Service: Neurosurgery;  Laterality: Right;   TONSILLECTOMY     TUBAL LIGATION      Assessment / Plan / Recommendation Clinical Impression Patient Admitting Diagnosis: Metastatic cancer to spine   History of Present Illness: Kelly [name], a 57 year old female with a history of glioblastoma post craniotomy 2023, underwent chemotherapy and radiation, presents on 03/07/22 to [facility] with sudden left lower extremity weakness and swelling noticed four days ago.  She experienced unusual sensations in the left lower extremity, with no prior history of clots. Examination revealed left lower extremity immobility and decreased sensation extending to the lower abdomen on the left side.   MRI of the spine revealed intrathecal, dural based versus intramedullary mass in the spinal cord, centered at T6, which measures 11 x 14 x 21 mm,concerning for metastatic disease from the patient's glioblastoma. 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 Began on Decadron and IV narcotics. Oncology and Neurosurgery consulted. Discussed at Tumor board. Neurosurgery felt the mass was not resectable with any expectation that she would maintain lower extremity function.   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 Began Keppra and Vimpat. Radiation simulation began and started XRT with plan for 10 treatments followed by oral Chemo.    Noted constipation and began bowel regimen with small results. Continue lisinopril for HTN. Lovenox for DVT prophylaxis. Palliative care consulted for goals of care. Remains full code.  Re-consulted on 03/26/2022 due to concern for PNA on recent CXR.  SLP re-consulted to complete clinical swallow evaluation in the setting of recent PNA on chest imaging. Pt greeted awake/alert and lyging semi-reclined in bed, taking morning medicaitons whole with thin liquids with LPN present and administering medications. Agreeable to evaluation. Pleasant and participatory. Pt known to [place] services from short-course of ST intervention for cognitive deficits during this admission (2/24 to 2/26) with no swallowing needs identified per CSE. Continues to exhibit mild cognitive impairments, particularly in executive functioning. Pt does endorse weakness since development of PNA.  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 OME unremarkable. Only one instance of throat clearing post-swallow. Vocal quality clear and dry across all consistencies and no coughing nor additional throat clearing noted.  Per chart review and pt report, pt with large hiatal hernia and "sticking" + "burning"  sensation with intake - pt reports burning sensation to be new since hospitalization - medication has been  prescribed to mitigate/tx this sensation. Currently, pt self-feeds independently and adheres to small and single bites without intervention. SLP provided education re: reflux precautions and importance of sitting full upright, EOB, or OOB for all PO intake, sitting up for at least 20-30 minutes after meals, and consuming 6 smaller vs 3 larger meals during the day - pt actively participated in education. SLP wrote recommendations down to assist with carryover and follow-through. Continue to recommend regular diet textures with thin liquids and adherence to aspiration and reflux precautions.  Given presentation and pt report + chart review, skilled ST intervention for deglutition does not appear indicated at this time. Results and recommendations were reviewed with pt who verbalized understanding and agreement with today's evaluation and plan. If difficulty remains, recommend GI consult to assess how esophageal related conditions may interfere with pulmonary safety - made MD and pt aware of recommendations. Please re-consult if any new difficulty arises. Thank you for this consult.   Skilled Therapeutic Interventions          CSE completed. Please see below for details.  SLP Assessment  Patient does not need any further Speech Lanaguage Pathology Services    Recommendations  Patient may use Passy-Muir Speech Valve:  (N/A) SLP Diet Recommendations: Age appropriate regular solids;Thin Liquid Administration via: Cup;Straw Medication Administration: Whole meds with liquid Supervision: Patient able to self feed;Intermittent supervision to cue for compensatory strategies Compensations: Minimize environmental distractions;Slow rate;Small sips/bites Postural Changes and/or Swallow Maneuvers: Out of bed for meals;Seated upright 90 degrees;Upright 30-60 min after meal Oral Care Recommendations: Oral care BID Recommendations for Other Services: Neuropsych consult Patient destination: Home Follow up  Recommendations: Other (comment) (May benefit from HH/OP services for further cognitive-linguistic evaluation to assess independence with iADLs if willing) Equipment Recommended: None recommended by SLP    SLP Frequency  (N/A)   SLP Duration  SLP Intensity  SLP Treatment/Interventions N/A   (N/A)   (N/A)    Pain Pain Assessment Pain Scale: 0-10 Pain Score: 0-No pain  Prior Functioning Cognitive/Linguistic Baseline: Baseline deficits (per pt, EF deficits) Type of Home: House  Lives With: Spouse Available Help at Discharge: Family;Available 24 hours/day Education: some college Vocation: On disability  SLP Evaluation Cognition Overall Cognitive Status: History of cognitive impairments - at baseline Arousal/Alertness: Awake/alert  Comprehension Auditory Comprehension Overall Auditory Comprehension: Appears within functional limits for tasks assessed Expression Expression Primary Mode of Expression: Verbal Verbal Expression Overall Verbal Expression: Appears within functional limits for tasks assessed Oral Motor Oral Motor/Sensory Function Overall Oral Motor/Sensory Function: Within functional limits Motor Speech Overall Motor Speech: Appears within functional limits for tasks assessed (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 Adequate - no difficulty in normal conservation, social interaction, listening to TV   Expression of Ideas and Wants Expression of Ideas and Wants: 4. Without difficulty (complex and basic) - expresses complex messages without difficulty and with speech that is clear and easy to understand   Understanding Verbal and Non-Verbal Content Understanding Verbal and Non-Verbal  Content: 3. Usually understands - understands most conversations, but misses some part/intent of message. Requires cues at times to understand  Memory/Recall Ability Memory/Recall Ability :  Current season;That he or she is in a hospital/hospital unit;Staff names and faces    Bedside Swallowing Assessment General Date of Onset: 03/15/22 Previous Swallow Assessment: BSE completed on 03/15/2022 with functional deglutition noted - recs for continuation of regular diet and thin liquids. Diet Prior to this Study: Regular;Thin liquids (Level 0) Temperature Spikes Noted: No Respiratory Status: Room air History of Recent Intubation: No Behavior/Cognition: Alert;Cooperative;Pleasant mood Oral Cavity - Dentition: Adequate natural dentition Self-Feeding Abilities: Able to feed self Patient Positioning: Upright in chair/Tumbleform Baseline Vocal Quality: Normal Volitional Cough: Weak Volitional Swallow: Able to elicit  Ice Chips Ice chips: Not tested Thin Liquid Thin Liquid: Within functional limits Presentation: Straw Nectar Thick Nectar Thick Liquid: Not tested Honey Thick Honey Thick Liquid: Not tested Puree Puree: Not tested Solid Solid: Within functional limits Presentation: Self Fed BSE Assessment Suspected Esophageal Findings Suspected Esophageal Findings: Belching Risk for Aspiration Impact on safety and function: Mild aspiration risk Other Related Risk Factors: History of esophageal-related issues;Cognitive impairment;History of GERD  Short Term Goals: N/A  Recommendations for other services: None   Discharge Criteria: Patient will be discharged from SLP if patient refuses treatment 3 consecutive times without medical reason, if treatment goals not met, if there is a change in medical status, if patient makes no progress towards goals or if patient is discharged from hospital.  The above assessment, treatment plan, treatment alternatives and goals were discussed and mutually  agreed upon: by patient  Romelle Starcher A Axten Pascucci 03/26/2022, 9:42 AM

## 2022-03-26 NOTE — Progress Notes (Signed)
PROGRESS NOTE   Subjective/Complaints:  Pt reports her family wants her to go home Friday- after done with IV ABX- can finish Friday afternoon and then send her home on PO ABX.   Pt reports breathing well; no complaints about SOB.   But still having a lot of fatigue and RLE weakness.        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 EXAM: CT THORACIC AND LUMBAR SPINE WITHOUT CONTRAST TECHNIQUE: Multidetector CT imaging of the thoracic and lumbar spine was performed without contrast. Multiplanar CT image reconstructions were also generated. RADIATION DOSE REDUCTION: This exam was performed according to the departmental dose-optimization program which includes automated exposure control, adjustment of the mA and/or kV according to patient size and/or use of iterative reconstruction technique. COMPARISON:  MR total spine dated March 07, 2022. FINDINGS: CT THORACIC SPINE FINDINGS Alignment: Normal. Vertebrae: No acute fracture or focal pathologic process. Paraspinal and other soft tissues: Scattered ground-glass densities and peribronchovascular ground-glass consolidation in both lungs with smooth interlobular septal thickening. Unchanged very large hiatal hernia containing stomach and colon. Disc levels: Focal round hyperdensity in the spinal canal at T6 measuring 1.3 x 1.1 cm, corresponding to the metastasis seen better on recent MRI. No significant disc bulge or herniation. CT LUMBAR SPINE FINDINGS Segmentation: 5 lumbar type vertebrae. Alignment: Normal. Vertebrae: No acute fracture or focal pathologic process. Paraspinal and other soft tissues: Aortoiliac atherosclerotic vascular disease. Sigmoid  colonic diverticulosis. Disc levels: Mild disc bulging from L1-L2 through L4-L5. Lower lumbar facet arthropathy, moderate on the right at L5-S1. No spinal canal or neuroforaminal stenosis at any level. IMPRESSION: 1. Focal round hyperdensity in the spinal canal at T6 measuring 1.3 x 1.1 cm, corresponding to the metastasis seen better on recent MRI. 2. No acute osseous abnormality or significant degenerative changes in the thoracic or lumbar spine. 3. Scattered ground-glass densities and peribronchovascular ground-glass consolidation in both lungs, concerning for multifocal pneumonia. 4. Unchanged very large hiatal hernia containing stomach and colon. 5.  Aortic Atherosclerosis (ICD10-I70.0). Electronically Signed   By: Titus Dubin M.D.   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 EXAM: CT THORACIC AND LUMBAR SPINE WITHOUT CONTRAST TECHNIQUE: Multidetector CT imaging of the thoracic and lumbar spine was performed without contrast. Multiplanar CT image reconstructions were also generated. RADIATION DOSE REDUCTION: This exam was performed according to the departmental dose-optimization program which includes automated exposure control, adjustment of the mA and/or kV according to patient size and/or use of iterative reconstruction technique. COMPARISON:  MR total spine dated March 07, 2022. FINDINGS: CT THORACIC SPINE FINDINGS Alignment: Normal. Vertebrae: No acute fracture or focal pathologic process. Paraspinal and other soft tissues: Scattered ground-glass densities and peribronchovascular ground-glass consolidation in both lungs with smooth interlobular septal thickening. Unchanged very large hiatal hernia containing stomach and colon.  Disc levels: Focal round hyperdensity in the spinal canal at T6 measuring 1.3 x 1.1 cm, corresponding to the  metastasis seen better on recent MRI. No significant disc bulge or herniation. CT LUMBAR SPINE FINDINGS Segmentation: 5 lumbar type vertebrae. Alignment: Normal. Vertebrae: No acute fracture or focal pathologic process. Paraspinal and other soft tissues: Aortoiliac atherosclerotic vascular disease. Sigmoid colonic diverticulosis. Disc levels: Mild disc bulging from L1-L2 through L4-L5. Lower lumbar facet arthropathy, moderate on the right at L5-S1. No spinal canal or neuroforaminal stenosis at any level. IMPRESSION: 1. Focal round hyperdensity in the spinal canal at T6 measuring 1.3 x 1.1 cm, corresponding to the metastasis seen better on recent MRI. 2. No acute osseous abnormality or significant degenerative changes in the thoracic or lumbar spine. 3. Scattered ground-glass densities and peribronchovascular ground-glass consolidation in both lungs, concerning for multifocal pneumonia. 4. Unchanged very large hiatal hernia containing stomach and colon. 5.  Aortic Atherosclerosis (ICD10-I70.0). Electronically Signed   By: Titus Dubin M.D.   On: 03/25/2022 18:15   CT HEAD WO CONTRAST (5MM)  Result Date: 03/25/2022 CLINICAL DATA:  Slurred speech.  GBM. EXAM: CT HEAD WITHOUT CONTRAST CT CERVICAL SPINE WITHOUT CONTRAST TECHNIQUE: Multidetector CT imaging of the head and cervical spine was performed following the standard protocol without intravenous contrast. Multiplanar CT image reconstructions of the cervical spine were also generated. RADIATION DOSE REDUCTION: This exam was performed according to the departmental dose-optimization program which includes automated exposure control, adjustment of the mA and/or kV according to patient size and/or use of iterative reconstruction technique. COMPARISON:  Head CT dated 03/01/2021. FINDINGS: CT HEAD FINDINGS Brain: Postoperative changes of the right temporal lobe with encephalomalacia. There is mild periventricular and deep white matter chronic microvascular ischemic  changes. There is no acute intracranial hemorrhage. No mass effect or midline shift. No extra-axial fluid collection. Vascular: No hyperdense vessel or unexpected calcification. Skull: Right temporal craniotomy.  No acute calvarial pathology. Sinuses/Orbits: No acute finding. Other: None CT CERVICAL SPINE FINDINGS Alignment: No acute subluxation. There is straightening of normal cervical lordosis which may be positional or due to muscle spasm. Skull base and vertebrae: No acute fracture. Soft tissues and spinal canal: No prevertebral fluid or swelling. No visible canal hematoma. Disc levels:  No acute findings.  Degenerative changes. Upper chest: Patchy bilateral airspace densities concerning for pneumonia, possibly atypical in etiology. Clinical correlation is recommended. Other: Bilateral carotid bulb calcified plaques. IMPRESSION: 1. No acute intracranial pathology. 2. Postoperative changes of the right temporal lobe with encephalomalacia. 3. No acute/traumatic cervical spine pathology. 4. Patchy bilateral airspace densities concerning for pneumonia, possibly atypical in etiology. Electronically Signed   By: Anner Crete M.D.   On: 03/25/2022 18:03   CT CERVICAL SPINE WO CONTRAST  Result Date: 03/25/2022 CLINICAL DATA:  Slurred speech.  GBM. EXAM: CT HEAD WITHOUT CONTRAST CT CERVICAL SPINE WITHOUT CONTRAST TECHNIQUE: Multidetector CT imaging of the head and cervical spine was performed following the standard protocol without intravenous contrast. Multiplanar CT image reconstructions of the cervical spine were also generated. RADIATION DOSE REDUCTION: This exam was performed according to the departmental dose-optimization program which includes automated exposure control, adjustment of the mA and/or kV according to patient size and/or use of iterative reconstruction technique. COMPARISON:  Head CT dated 03/01/2021. FINDINGS: CT HEAD FINDINGS Brain: Postoperative changes of the right temporal lobe with  encephalomalacia. There is mild periventricular and deep white matter chronic microvascular ischemic changes. There is no acute intracranial hemorrhage. No mass effect or midline shift.  No extra-axial fluid collection. Vascular: No hyperdense vessel or unexpected calcification. Skull: Right temporal craniotomy.  No acute calvarial pathology. Sinuses/Orbits: No acute finding. Other: None CT CERVICAL SPINE FINDINGS Alignment: No acute subluxation. There is straightening of normal cervical lordosis which may be positional or due to muscle spasm. Skull base and vertebrae: No acute fracture. Soft tissues and spinal canal: No prevertebral fluid or swelling. No visible canal hematoma. Disc levels:  No acute findings.  Degenerative changes. Upper chest: Patchy bilateral airspace densities concerning for pneumonia, possibly atypical in etiology. Clinical correlation is recommended. Other: Bilateral carotid bulb calcified plaques. IMPRESSION: 1. No acute intracranial pathology. 2. Postoperative changes of the right temporal lobe with encephalomalacia. 3. No acute/traumatic cervical spine pathology. 4. Patchy bilateral airspace densities concerning for pneumonia, possibly atypical in etiology. Electronically Signed   By: Anner Crete M.D.   On: 03/25/2022 18:03   DG Shoulder Left  Result Date: 03/24/2022 CLINICAL DATA:  Left shoulder pain. EXAM: LEFT SHOULDER - 2+ VIEW COMPARISON:  Chest radiograph 01/16/2021 FINDINGS: Multiple longitudinal linear lucencies within the inferior aspect of the glenoid and adjacent scapular body appear to represent normal trabecular markings, and an acute fracture is felt less likely. Mild inferior glenoid and humeral head-neck junction degenerative spurring. Mild acromioclavicular joint space narrowing and peripheral osteophytosis. A calcified likely benign vascular phlebolith overlies the superior left lung, similar to 01/16/2021 radiograph. IMPRESSION: 1. No definite acute fracture 2.  Mild acromioclavicular and glenohumeral osteoarthritis. Electronically Signed   By: Yvonne Kendall M.D.   On: 03/24/2022 14:18   DG CHEST PORT 1 VIEW  Result Date: 03/24/2022 CLINICAL DATA:  Hypoxia EXAM: PORTABLE CHEST 1 VIEW COMPARISON:  CT CAP 01/15/21, CXR 01/16/21 FINDINGS: No pleural effusion. No pneumothorax. There is a massive hiatal hernia. Compared to prior exam there new patchy airspace opacity in the left mid and lower lung. No radiographically apparent displaced rib fractures. Cardiac contours are poorly assessed. Degenerative changes of the bilateral AC joints. IMPRESSION: New patchy airspace opacity in the left mid and lower lung. In the setting of the large hiatal hernia, these are suspicious for aspiration and/or infection. Electronically Signed   By: Marin Roberts M.D.   On: 03/24/2022 14:16   Recent Labs    03/24/22 0622 03/25/22 1032  WBC 9.3 9.9  HGB 16.5* 13.4  HCT 47.7* 38.3  PLT 217 160    Recent Labs    03/24/22 0622 03/25/22 1032  NA 132* 134*  K 4.3 4.1  CL 102 102  CO2 23 24  GLUCOSE 126* 99  BUN 20 18  CREATININE 0.71 0.74  CALCIUM 8.1* 8.3*     Intake/Output Summary (Last 24 hours) at 03/26/2022 1016 Last data filed at 03/26/2022 0825 Gross per 24 hour  Intake 118 ml  Output 925 ml  Net -807 ml        Physical Exam: Vital Signs Blood pressure 120/75, pulse 83, temperature 98.1 F (36.7 C), temperature source Oral, resp. rate 16, height '5\' 4"'$  (1.626 m), weight 90.3 kg, SpO2 92 %.      General: awake, alert, appropriate, sitting up in bed; nursing at bedside; NAD HENT: conjugate gaze; oropharynx moist CV: regular rate and rhythm no JVD Pulmonary: CTA B/L; no W/R/R- good air movement- decreased at bases B/L GI: soft, NT, ND, (+)BS Psychiatric: appropriate Neurological: Ox3 - decreased to light touch on R foot still noted- Can now do DF 2-/5 and PF 2-/5 on L foot, noted again today RLE- HF 3-/5;  KE/KF 3/5; and DF 4-/5 and PF 4/5  Prior  exam Strength:                RUE: 5/5 SA, 5/5 EF, 5/5 EE, 5/5 WE, 5/5 FF, 5/5 FA                 LUE: 5/5 SA, 5/5 EF, 5/5 EE, 5/5 WE, 5/5 FF, 5/5 FA                 RLE: 5/5 HF, 5/5 KE, 5/5 DF, 5/5 EHL, 5/5 PF                 LLE:  2/5 HF, 1+/5 KE, 0/5 DF, 3/5 EHL, 1/5 PF  Neurologic exam: Strength able to lift bilateral upper extremity and right lower extremity to gravity, unable to lift left lower extremity to gravity, alert and awake, no dysarthria, patchy sensory loss approximately T5 and below on the left.,  Cranial nerves II through XII grossly intact, no abnormal tone   Assessment/Plan: 1. Functional deficits which require 3+ hours per day of interdisciplinary therapy in a comprehensive inpatient rehab setting. Physiatrist is providing close team supervision and 24 hour management of active medical problems listed below. Physiatrist and rehab team continue to assess barriers to discharge/monitor patient progress toward functional and medical goals  Care Tool:  Bathing    Body parts bathed by patient: Left upper leg, Right arm, Left arm, Right lower leg, Chest, Abdomen, Left lower leg, Front perineal area, Face, Buttocks, Right upper leg         Bathing assist Assist Level: Moderate Assistance - Patient 50 - 74%     Upper Body Dressing/Undressing Upper body dressing   What is the patient wearing?: Pull over shirt    Upper body assist Assist Level: Contact Guard/Touching assist    Lower Body Dressing/Undressing Lower body dressing      What is the patient wearing?: Underwear/pull up, Pants     Lower body assist Assist for lower body dressing: Moderate Assistance - Patient 50 - 74%     Toileting Toileting    Toileting assist Assist for toileting: Moderate Assistance - Patient 50 - 74%     Transfers Chair/bed transfer  Transfers assist     Chair/bed transfer assist level: Moderate Assistance - Patient 50 - 74%     Locomotion Ambulation   Ambulation  assist   Ambulation activity did not occur: Safety/medical concerns          Walk 10 feet activity   Assist  Walk 10 feet activity did not occur: Safety/medical concerns        Walk 50 feet activity   Assist Walk 50 feet with 2 turns activity did not occur: Safety/medical concerns         Walk 150 feet activity   Assist Walk 150 feet activity did not occur: Safety/medical concerns         Walk 10 feet on uneven surface  activity   Assist Walk 10 feet on uneven surfaces activity did not occur: Safety/medical concerns         Wheelchair     Assist Is the patient using a wheelchair?: Yes Type of Wheelchair: Manual    Wheelchair assist level: Supervision/Verbal cueing Max wheelchair distance: 130    Wheelchair 50 feet with 2 turns activity    Assist        Assist Level: Supervision/Verbal cueing   Wheelchair 150 feet activity  Assist      Assist Level: Moderate Assistance - Patient 50 - 74%   Blood pressure 120/75, pulse 83, temperature 98.1 F (36.7 C), temperature source Oral, resp. rate 16, height '5\' 4"'$  (1.626 m), weight 90.3 kg, SpO2 92 %.  Medical Problem List and Plan: 1. 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  Antithrombotics: -DVT/anticoagulation:  Pharmaceutical: Lovenox '40mg'$  QD             -antiplatelet therapy: none   3. Pain Management:  -continue oxycodone 10 mg 5 x daily -continue Flexeril 5-10 mg q  HS -continue Voltaren gel 2g QID, Advil '400mg'$  q6h PRN and Tylenol as needed, Robaxin '500mg'$  q6h PRN -Consider adjusting oxycodone to as needed   -2/28  will add gabapentin 300 mg QHS and increase again on Friday to TID  -2/29- tolerating well- no side effects  3/4- will increase oxycodone to 15 mg 5x/day- said also taking ibuprofen as needed  3/5- changed pain meds to prn and will take away ibuprofen due to chest pain 4. Mood/Behavior/Sleep: LCSW to evaluate and provide emotional support             -antipsychotic agents: n/a             -continue Lexapro '10mg'$  QHS and Elavil '100mg'$  QHS  -Trazodone 25-'50mg'$  QHS PRN  -03/15/22 start '6mg'$  melatonin QHS-helping 03/16/22, continue   -2/27- still waking up ~ 4am- but doesn't want to change meds yet  -2/29- waking up early, but no med changes per pt.   -03/23/22 still moderately poor sleep but doesn't want med changes 5. Neuropsych/cognition: This patient is capable of making decisions on her own behalf.   6. Skin/Wound Care: Routine skin care checks   7. Fluids/Electrolytes/Nutrition: Routine Is and Os and follow-up chemistries, next 03/17/22  -03/15/22 Na 134 noted 03/14/22, monitor on following labs next week -2/26 Bun up to 24, pt plans to have yeti cup brought over by family to increase fluid intake, recheck Wednesday for azotemia   -2/28- BUN down to 17 from 24- will con't to monitor weekly. Next 03/24/22 8: Hypertension: monitor TID and prn             -continue lisinopril 10 mg daily  3/5- BP much better than yesterday when was 60/40s- is up to 105/67- on IVFs still- thank you internal medicine  3/6- d/c;d IVFs- BP running 120s/70s-80s- con't off Lisinopril Vitals:   03/25/22 0000 03/25/22 0030 03/25/22 0100 03/25/22 0130  BP: (!) 92/58 96/61 (!) 92/58 91/62   03/25/22 0200 03/25/22 0400 03/25/22 0430 03/25/22 0500  BP: (!) 94/55 (!) 91/55 105/67 101/67   03/25/22 0530 03/25/22 1552 03/25/22 2205 03/26/22 0430  BP: 101/64 (!) 139/117 127/83  120/75       9: Glioblastoma s/p resection, 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:             -  continue Decadron 6 mg q 6 hours -continue Keppra 1500 mg BID and Vimpat 50 mg BID for seizure ppx             - XRT x10 followed by oral chemotherapy   -2/27- to start Radiation today at 2:30 per chart  -2/28 no change to Decadron dosing  -3/1- XRT going well per pt.  10: Obesity, Class III- BMI 34             - Complicates medical decision making; nutrition onboard   11: GI prophylaxis: Protonix '40mg'$  BID   3/4- having a lot of reflux Sx's but on Protonix BID 40 mg- and allergic to Zantac- anaphylaxis- no other choices- but will ask pt to avoid ibuprofen if at all possible. .  12: Constipation vs neurogenic bowel: Given smog enema yesterday             -Senokot 2tabs QHS             -Miralax 17g BID  -PRNs: MoM, Fleet's, Sorbitol 18m -2/23 had a large bowel movement yesterday, had bowel movements the day prior also, continue to monitor -3/1- will make Miralax prn since having a lot of bowel incontinence- due to loose stools coupled with lack of sensation.  -03/22/22 LBM this morning, no incontinence documented but pt states lack of sensation still present; monitor 3/4- Having regular BM's- LBM last night 13. Urinary urgency vs. Neurogenic bladder. Continent.              - UA 2/22 negative except for small HgB -2/29- going well- no inconitnence and retaining small amounts, but not enough to need cathing  3/5- asking pt to be bladder scanned- pending results.  14.  Leukocytosis- new pneumonia -Likely due to Decadron/hemoconcentration, no signs of infection -03/17/22 WNL WBC, monitor weekly, next 03/24/22  3/4- WBC 9.3k  3/5- has a new pneumonia based on CXR- L side- on Zithromax and Rocephin IV- spoke with lab to get labs drawn at 10am- had tried, but was in therapy and bathroom- U/A checked- trace leuks, but otherwise completely negative 15. Hyponatremia   -Start salt tabs BID, recheck  Wednesday -2/28- Na  134- doing better- con't regimen for now, monitor weekly next on 03/24/22  3/4- Na 132- overall stable 16. Sepsis 3/4  3/5- had hypotension  down to 60s/40s and lactic acidosis- is s/p 2 L fast IVFs and now on 100cc/hour- labs pending to see how things going. Wasn't transferred- did stabilize- but monitoring closely. Of note, pt eats lying down with hiatal hernia-  17. RLE weakness  3/5- will check CT of cervical, thoracic and lumbar spine since doing worse-   3/6- nothing stands out as cause, but is still the biggest issue/barrier as well as LLE weakness  I spent a total of 51   minutes on total care today- >50% coordination of care- due to  D/w SW about d/c date of 3/8 per pt- want to move up; also d/w nursing x2 about IVFs as well as review of notes and secure chat on Dr VMickeal Skinnerfor steroids.    LOS: 13 days A FACE TO FACE EVALUATION WAS PERFORMED  Delynda Sepulveda 03/26/2022, 10:16 AM

## 2022-03-26 NOTE — Progress Notes (Signed)
PROGRESS NOTE Cassundra Shurn  [id number] DOB: Oct 06, 1965 DOA: 03/13/2022 PCP: [facility]  Brief Narrative/Hospital Course:  57 y.o. 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 Arnoldo Morale, also has underlying history of dyslipidemia, high pretension and hypothyroidism.  She was initially admitted to the hospital for tumor resection subsequently sent to [facility] for rehab, she is currently undergoing radiation treatments following which she is scheduled for chemotherapy treatments.  [facility] team was consulted on 03/24/2022 for hypotension and patient was found to have pneumonia at that time > patient was initiated on ceftriaxone and azithromycin.  Sepsis and hypotension due to pneumonia: Respiratory status overall remains stable saturating well, afebrile hemodynamically stable, although procalcitonin elevated 6.5.  Continue on ceftriaxone and azithromycin.  Blood culture reviewed from 3/4 NGTD, continue pulmonary support, follow-up culture data, encourage PT OT ambulation.  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 Mickeal Skinner following next continue Decadron, Neurontin, Vimpat, Keppra per primary team> Dr. Mickeal Skinner recommends to decrease Decadron to 4 mg daily upon discharge.  Continue pain meds with oxy, Elavil. Undergoing radiation treatment and is scheduled for outpatient chemotherapy as well.  Continue PT OT and plan for [facility].  Continue plan as per neurosurgery team, she continues to have left lower extremity weakness along with possible urinary retention and neurogenic bladder  Neurogenic bladder: Continue Flomax, monitor voiding, if persistent will need Foley catheterization as it may not improve in the setting of T-spine metastasis, at this time primary prefers renal catheterization, UA  appears abnormal  History of hypothyroidism: TSH checked and normal 1.1.  cortisol was also checked 3/5 and low 1.5 but she is on steroid ? Significance- will need fu OP. GERD continue PPI Hyperlipidemia continue statin    Subjective: Seen and examined this morning on wheelchair, working with physical therapy Intermittent cough last night but   Assessment and Plan: Principal Problem:   Thoracic spinal cord injury, sequela ([place]) Active Problems:   Adjustment disorder with depressed mood   Glioblastoma multiforme (HCC)   Sepsis and hypotension due to pneumonia: Respiratory status stable on room air,afebrile hemodynamically stable, although procalcitonin elevated 6.5. Continue on ceftriaxone and azithromycin to complete the course.Blood culture reviewed from 3/4 NGTD, continue pulmonary support, follow-up culture data, encourage PT OT ambulation.  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 Mickeal Skinner following next continue Decadron, Neurontin, Vimpat, Keppra per primary team> Dr. Mickeal Skinner recommends to decrease Decadron to 4 mg daily upon discharge.  Continue pain meds with oxy, Elavil. Undergoing radiation treatment and is scheduled for outpatient chemotherapy as well.  Continue PT OT and plan for [facility].  Continue plan as per neurosurgery team, she continues to have left lower extremity weakness along with possible urinary retention and neurogenic bladder  Neurogenic bladder: Continue Flomax, monitor voiding, if persistent will need Foley catheterization as it may not improve in the setting of T-spine metastasis, at this time primary prefers renal catheterization, UA appears abnormal  History of hypothyroidism: TSH checked and normal 1.1.  cortisol was also checked 3/5 and low 1.5 but she is on steroid ? Significance- will need fu OP. GERD continue PPI Hyperlipidemia continue statin Class I Obesity:Patient's  Body mass index is 34.17 kg/m. : Will benefit with PCP follow-up, weight loss  healthy lifestyle and outpatient sleep evaluation.  DVT prophylaxis: Place TED hose Start: 03/19/22 0839 enoxaparin (LOVENOX) injection 40 mg Start: 03/14/22 1200 Code Status:   Code Status: Full Code Family Communication: plan of care discussed with patient at bedside. Patient status is: Inpatient because of pneumonia and malignancy Level of care:    Dispo: The patient is from: HOME            Anticipated disposition: tbd PER PRIMARY. TRH will continue to follow while hospitalized Objective: Vitals last 24 hrs: Vitals:   03/25/22 0530 03/25/22 1552 03/25/22 2205 03/26/22 0430  BP: 101/64 (!) 139/117 127/83 120/75  Pulse: 86 91 87 83  Resp: '16 18  16  '$ Temp:  98.3 F (36.8 C) 98.1 F (36.7 C) 98.1 F (36.7 C)  TempSrc:  Oral  Oral  SpO2:  92% 95% 92%  Weight:      Height:       Weight change:   Physical Examination: General exam: alert awake, older than stated age HEENT:Oral mucosa moist, Ear/Nose WNL grossly Respiratory system: bilaterally basal diminished BS, no use of accessory muscle Cardiovascular system: S1 & S2 +, No JVD. Gastrointestinal system: Abdomen soft,NT,ND, BS+ Nervous System:Alert, awake, moving extremities. Extremities: LE edema+ nonpitting,distal peripheral pulses palpable.  Skin: No rashes,no icterus. MSK: Normal muscle bulk,tone, power  Medications reviewed:  Scheduled Meds:  amitriptyline  100 mg Oral QHS   atorvastatin  10 mg Oral Daily   calcium carbonate  400 mg of elemental calcium Oral TID   cyclobenzaprine  5-10 mg Oral QHS   dexamethasone  4 mg Oral Q12H   diclofenac Sodium  2 g Topical QID   enoxaparin (LOVENOX) injection  40 mg Subcutaneous Q24H   escitalopram  10 mg Oral QHS   gabapentin  300 mg Oral TID   lacosamide  100 mg Oral BID   levETIRAcetam  1,500 mg Oral BID   melatonin  10 mg Oral QHS   oxyCODONE  5 mg Oral 5 X Daily   pantoprazole  40 mg  Oral BID   senna-docusate  2 tablet Oral QHS   sodium chloride  1 g Oral BID WC   tamsulosin  0.4 mg Oral QPC supper   Continuous Infusions:  azithromycin 500 mg (03/25/22 1626)   cefTRIAXone (ROCEPHIN)  IV 2 g (03/25/22 1745)      Diet Order             Diet regular Room service appropriate? Yes; Fluid consistency: Thin  Diet effective now                  Intake/Output Summary (Last 24 hours) at 03/26/2022 1117 Last data filed at 03/26/2022 0825 Gross per 24 hour  Intake 118 ml  Output 925 ml  Net -807 ml   Net IO Since Admission: -5,726 mL [03/26/22 1117]  Wt Readings from Last 3 Encounters:  03/13/22 90.3 kg  03/08/22 90.7 kg  03/03/22 90.2 kg     Unresulted Labs (From admission, onward)     Start     Ordered   03/25/22 0557  MRSA Next Gen by PCR, Nasal  Once,   R        03/25/22 0556   03/17/22 [id number]  Basic metabolic panel  Weekly,   R      03/13/22 1722   03/17/22 0500  CBC  Weekly,   R      03/13/22 1722          Data Reviewed: I have personally reviewed following  labs and imaging studies CBC: Recent Labs  Lab 03/24/22 0622 03/25/22 1032  WBC 9.3 9.9  HGB 16.5* 13.4  HCT 47.7* 38.3  MCV 102.6* 101.9*  PLT 217 0000000   Basic Metabolic Panel: Recent Labs  Lab 03/24/22 0622 03/25/22 1032  NA 132* 134*  K 4.3 4.1  CL 102 102  CO2 23 24  GLUCOSE 126* 99  BUN 20 18  CREATININE 0.71 0.74  CALCIUM 8.1* 8.3*  MG  --  2.1   GFR: Estimated Creatinine Clearance: 84.4 mL/min (by C-G formula based on SCr of 0.74 mg/dL). Liver Function Tests: Recent Labs  Lab 03/25/22 1032  AST 15  ALT 29  ALKPHOS 71  BILITOT 0.7  PROT 5.5*  ALBUMIN 2.2*  CBG: Recent Labs  Lab 03/24/22 1010  GLUCAP 126*   Recent Labs    03/25/22 1032  TSH 1.145   Sepsis Labs: Recent Labs  Lab 03/24/22 1108 03/24/22 1113 03/24/22 1332 03/25/22 1032  PROCALCITON 5.89  --   --  6.45  LATICACIDVEN  --  1.8 2.0*  --     Recent Results (from the past 240  hour(s))  Culture, blood (Routine X 2) w Reflex to ID Panel     Status: None (Preliminary result)   Collection Time: 03/24/22  1:32 PM   Specimen: BLOOD LEFT HAND  Result Value Ref Range Status   Specimen Description BLOOD LEFT HAND  Final   Special Requests   Final    BOTTLES DRAWN AEROBIC AND ANAEROBIC Blood Culture adequate volume   Culture   Final    NO GROWTH 2 DAYS Performed at [facility], [place] [address], [place], [place] [address]    Report Status PENDING  Incomplete  Culture, blood (Routine X 2) w Reflex to ID Panel     Status: None (Preliminary result)   Collection Time: 03/24/22  1:40 PM   Specimen: BLOOD LEFT WRIST  Result Value Ref Range Status   Specimen Description BLOOD LEFT WRIST  Final   Special Requests   Final    BOTTLES DRAWN AEROBIC ONLY Blood Culture adequate volume   Culture   Final    NO GROWTH 2 DAYS Performed at [facility], [place] [address], [place], [place] [address]    Report Status PENDING  Incomplete    Antimicrobials: Anti-infectives (From admission, onward)    Start     Dose/Rate Route Frequency Ordered Stop   03/24/22 1600  cefTRIAXone (ROCEPHIN) 2 g in sodium chloride 0.9 % 100 mL IVPB        2 g 200 mL/hr over 30 Minutes Intravenous Every 24 hours 03/24/22 1457     03/24/22 1600  azithromycin (ZITHROMAX) 500 mg in sodium chloride 0.9 % 250 mL IVPB        500 mg 250 mL/hr over 60 Minutes Intravenous Every 24 hours 03/24/22 1457        Culture/Microbiology    Component Value Date/Time   SDES BLOOD LEFT WRIST 03/24/2022 1340   SPECREQUEST  03/24/2022 1340    BOTTLES DRAWN AEROBIC ONLY Blood Culture adequate volume   CULT  03/24/2022 1340    NO GROWTH 2 DAYS Performed at [facility] [address], [place], [place] [address]    REPTSTATUS PENDING 03/24/2022 1340  Radiology Studies: CT THORACIC SPINE WO CONTRAST  Result Date: 03/25/2022 CLINICAL DATA:  Metastatic glioblastoma to the thoracic spinal cord.  Incomplete paraplegia with worsening right leg weakness. EXAM: CT  THORACIC AND LUMBAR SPINE WITHOUT CONTRAST TECHNIQUE: Multidetector CT imaging of the thoracic and lumbar spine was performed without contrast. Multiplanar CT image reconstructions were also generated. RADIATION DOSE REDUCTION: This exam was performed according to the departmental dose-optimization program which includes automated exposure control, adjustment of the mA and/or kV according to patient size and/or use of iterative reconstruction technique. COMPARISON:  MR total spine dated March 07, 2022. FINDINGS: CT THORACIC SPINE FINDINGS Alignment: Normal. Vertebrae: No acute fracture or focal pathologic process. Paraspinal and other soft tissues: Scattered ground-glass densities and peribronchovascular ground-glass consolidation in both lungs with smooth interlobular septal thickening. Unchanged very large hiatal hernia containing stomach and colon. Disc levels: Focal round hyperdensity in the spinal canal at T6 measuring 1.3 x 1.1 cm, corresponding to the metastasis seen better on recent MRI. No significant disc bulge or herniation. CT LUMBAR SPINE FINDINGS Segmentation: 5 lumbar type vertebrae. Alignment: Normal. Vertebrae: No acute fracture or focal pathologic process. Paraspinal and other soft tissues: Aortoiliac atherosclerotic vascular disease. Sigmoid colonic diverticulosis. Disc levels: Mild disc bulging from L1-L2 through L4-L5. Lower lumbar facet arthropathy, moderate on the right at L5-S1. No spinal canal or neuroforaminal stenosis at any level. IMPRESSION: 1. Focal round hyperdensity in the spinal canal at T6 measuring 1.3 x 1.1 cm, corresponding to the metastasis seen better on recent MRI. 2. No acute osseous abnormality or significant degenerative changes in the thoracic or lumbar spine. 3. Scattered ground-glass densities and peribronchovascular ground-glass consolidation in both lungs, concerning for multifocal pneumonia. 4.  Unchanged very large hiatal hernia containing stomach and colon. 5.  Aortic Atherosclerosis (ICD10-I70.0). Electronically Signed   By: Titus Dubin M.D.   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 EXAM: CT THORACIC AND LUMBAR SPINE WITHOUT CONTRAST TECHNIQUE: Multidetector CT imaging of the thoracic and lumbar spine was performed without contrast. Multiplanar CT image reconstructions were also generated. RADIATION DOSE REDUCTION: This exam was performed according to the departmental dose-optimization program which includes automated exposure control, adjustment of the mA and/or kV according to patient size and/or use of iterative reconstruction technique. COMPARISON:  MR total spine dated March 07, 2022. FINDINGS: CT THORACIC SPINE FINDINGS Alignment: Normal. Vertebrae: No acute fracture or focal pathologic process. Paraspinal and other soft tissues: Scattered ground-glass densities and peribronchovascular ground-glass consolidation in both lungs with smooth interlobular septal thickening. Unchanged very large hiatal hernia containing stomach and colon. Disc levels: Focal round hyperdensity in the spinal canal at T6 measuring 1.3 x 1.1 cm, corresponding to the metastasis seen better on recent MRI. No significant disc bulge or herniation. CT LUMBAR SPINE FINDINGS Segmentation: 5 lumbar type vertebrae. Alignment: Normal. Vertebrae: No acute fracture or focal pathologic process. Paraspinal and other soft tissues: Aortoiliac atherosclerotic vascular disease. Sigmoid colonic diverticulosis. Disc levels: Mild disc bulging from L1-L2 through L4-L5. Lower lumbar facet arthropathy, moderate on the right at L5-S1. No spinal canal or neuroforaminal stenosis at any level. IMPRESSION: 1. Focal round hyperdensity in the spinal canal at T6 measuring 1.3 x 1.1 cm,  corresponding to the metastasis seen better on recent MRI. 2. No acute osseous abnormality or significant degenerative changes in the thoracic or lumbar spine. 3. Scattered ground-glass densities and peribronchovascular ground-glass consolidation in both lungs, concerning for multifocal pneumonia. 4. Unchanged very large hiatal hernia containing stomach and colon. 5.  Aortic Atherosclerosis (ICD10-I70.0). Electronically Signed   By: Gwyndolyn Saxon  Marzella Schlein M.D.   On: 03/25/2022 18:15   CT HEAD WO CONTRAST (5MM)  Result Date: 03/25/2022 CLINICAL DATA:  Slurred speech.  GBM. EXAM: CT HEAD WITHOUT CONTRAST CT CERVICAL SPINE WITHOUT CONTRAST TECHNIQUE: Multidetector CT imaging of the head and cervical spine was performed following the standard protocol without intravenous contrast. Multiplanar CT image reconstructions of the cervical spine were also generated. RADIATION DOSE REDUCTION: This exam was performed according to the departmental dose-optimization program which includes automated exposure control, adjustment of the mA and/or kV according to patient size and/or use of iterative reconstruction technique. COMPARISON:  Head CT dated 03/01/2021. FINDINGS: CT HEAD FINDINGS Brain: Postoperative changes of the right temporal lobe with encephalomalacia. There is mild periventricular and deep white matter chronic microvascular ischemic changes. There is no acute intracranial hemorrhage. No mass effect or midline shift. No extra-axial fluid collection. Vascular: No hyperdense vessel or unexpected calcification. Skull: Right temporal craniotomy.  No acute calvarial pathology. Sinuses/Orbits: No acute finding. Other: None CT CERVICAL SPINE FINDINGS Alignment: No acute subluxation. There is straightening of normal cervical lordosis which may be positional or due to muscle spasm. Skull base and vertebrae: No acute fracture. Soft tissues and spinal canal: No prevertebral fluid or swelling. No visible canal hematoma. Disc levels:   No acute findings.  Degenerative changes. Upper chest: Patchy bilateral airspace densities concerning for pneumonia, possibly atypical in etiology. Clinical correlation is recommended. Other: Bilateral carotid bulb calcified plaques. IMPRESSION: 1. No acute intracranial pathology. 2. Postoperative changes of the right temporal lobe with encephalomalacia. 3. No acute/traumatic cervical spine pathology. 4. Patchy bilateral airspace densities concerning for pneumonia, possibly atypical in etiology. Electronically Signed   By: Anner Crete M.D.   On: 03/25/2022 18:03   CT CERVICAL SPINE WO CONTRAST  Result Date: 03/25/2022 CLINICAL DATA:  Slurred speech.  GBM. EXAM: CT HEAD WITHOUT CONTRAST CT CERVICAL SPINE WITHOUT CONTRAST TECHNIQUE: Multidetector CT imaging of the head and cervical spine was performed following the standard protocol without intravenous contrast. Multiplanar CT image reconstructions of the cervical spine were also generated. RADIATION DOSE REDUCTION: This exam was performed according to the departmental dose-optimization program which includes automated exposure control, adjustment of the mA and/or kV according to patient size and/or use of iterative reconstruction technique. COMPARISON:  Head CT dated 03/01/2021. FINDINGS: CT HEAD FINDINGS Brain: Postoperative changes of the right temporal lobe with encephalomalacia. There is mild periventricular and deep white matter chronic microvascular ischemic changes. There is no acute intracranial hemorrhage. No mass effect or midline shift. No extra-axial fluid collection. Vascular: No hyperdense vessel or unexpected calcification. Skull: Right temporal craniotomy.  No acute calvarial pathology. Sinuses/Orbits: No acute finding. Other: None CT CERVICAL SPINE FINDINGS Alignment: No acute subluxation. There is straightening of normal cervical lordosis which may be positional or due to muscle spasm. Skull base and vertebrae: No acute fracture. Soft  tissues and spinal canal: No prevertebral fluid or swelling. No visible canal hematoma. Disc levels:  No acute findings.  Degenerative changes. Upper chest: Patchy bilateral airspace densities concerning for pneumonia, possibly atypical in etiology. Clinical correlation is recommended. Other: Bilateral carotid bulb calcified plaques. IMPRESSION: 1. No acute intracranial pathology. 2. Postoperative changes of the right temporal lobe with encephalomalacia. 3. No acute/traumatic cervical spine pathology. 4. Patchy bilateral airspace densities concerning for pneumonia, possibly atypical in etiology. Electronically Signed   By: Anner Crete M.D.   On: 03/25/2022 18:03   DG Shoulder Left  Result Date: 03/24/2022 CLINICAL DATA:  Left shoulder pain. EXAM:  LEFT SHOULDER - 2+ VIEW COMPARISON:  Chest radiograph 01/16/2021 FINDINGS: Multiple longitudinal linear lucencies within the inferior aspect of the glenoid and adjacent scapular body appear to represent normal trabecular markings, and an acute fracture is felt less likely. Mild inferior glenoid and humeral head-neck junction degenerative spurring. Mild acromioclavicular joint space narrowing and peripheral osteophytosis. A calcified likely benign vascular phlebolith overlies the superior left lung, similar to 01/16/2021 radiograph. IMPRESSION: 1. No definite acute fracture 2. Mild acromioclavicular and glenohumeral osteoarthritis. Electronically Signed   By: Yvonne Kendall M.D.   On: 03/24/2022 14:18   DG CHEST PORT 1 VIEW  Result Date: 03/24/2022 CLINICAL DATA:  Hypoxia EXAM: PORTABLE CHEST 1 VIEW COMPARISON:  CT CAP 01/15/21, CXR 01/16/21 FINDINGS: No pleural effusion. No pneumothorax. There is a massive hiatal hernia. Compared to prior exam there new patchy airspace opacity in the left mid and lower lung. No radiographically apparent displaced rib fractures. Cardiac contours are poorly assessed. Degenerative changes of the bilateral AC joints. IMPRESSION: New  patchy airspace opacity in the left mid and lower lung. In the setting of the large hiatal hernia, these are suspicious for aspiration and/or infection. Electronically Signed   By: Marin Roberts M.D.   On: 03/24/2022 14:16     LOS: 13 days   Antonieta Pert, MD [facility]  03/26/2022, 11:17 AM

## 2022-03-26 NOTE — Hospital Course (Addendum)
57 y.o. 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 Arnoldo Morale, also has underlying history of dyslipidemia, high pretension and hypothyroidism.  She was initially admitted to the hospital for tumor resection subsequently sent to [facility] for rehab, she is currently undergoing radiation treatments following which she is scheduled for chemotherapy treatments.  [facility] team was consulted on 03/24/2022 for hypotension and patient was found to have pneumonia at that time > patient was initiated on ceftriaxone and azithromycin.

## 2022-03-26 NOTE — Progress Notes (Signed)
Occupational Therapy Session Note  Patient Details  Name: Kelly [name] MRN: [id number] Date of Birth: 1965-02-12  Today's Date: 03/26/2022 OT Individual Time: [id number] OT Individual Time Calculation (min): 73 min    Short Term Goals: Week 2:  OT Short Term Goal 1 (Week 2): STG=LTG 2/2 ELOS  Skilled Therapeutic Interventions/Progress Updates:   Pt seen for skilled OT session. Pt up in w/c trying to order STEDY for d/c home later this week. Pt also complained of fatigue and back pain 6/10 and requesting heat. OT applied with skin barrier moist heat for 10 minutes while assisting pt on correctly navigating online purchase sit, discussing with family via phone and then ultimately dtr purchasing device as pt realized she did not have her credit card info with her at the hospital. OT removed heat with pt reporting 3/10 pain. Pt requested toilet use. This OT clinician had not seen pt use STEDY since last week and wanted to prep pt for directing care at home to family and used this opportunity. Pt continues to be slightly resistant to STEDY use insisting she can safely transfer however with family, pt requires use of STEDY at this time d/t significant falls risk and L LE weakness. Pt was able to direct OT and perform sit to and from stand x 5 trials with min-mod A from lower surface and CGA from paddles of STEDY. Pt able to void and perform peri hygiene with lateral lean and once supported in device with more open thighs. Pt sat in STEDY at sink for hand washing. Lowered to w/c and left with parents arriving for a visit and NT for vitals. Pt with chair alarm set, needs and cal button in reach.   Therapy Documentation Precautions:  Precautions Precautions: Fall Precaution Booklet Issued: No Precaution Comments: L LE hemplegia Restrictions Weight Bearing Restrictions: No    Therapy/Group: Individual Therapy  Kelly [name] 03/26/2022, 7:42 AM

## 2022-03-27 LAB — MRSA NEXT GEN BY PCR, NASAL: MRSA by PCR Next Gen: NOT DETECTED

## 2022-03-27 MED ORDER — ENOXAPARIN (LOVENOX) PATIENT EDUCATION KIT
PACK | Freq: Once | Status: DC
  Filled 2022-03-27: qty 1

## 2022-03-27 MED ORDER — SODIUM CHLORIDE 0.9 % IV SOLN: INTRAVENOUS | Status: DC | PRN

## 2022-03-27 MED ORDER — OXYCODONE HCL 5 MG PO TABS
5.0000 mg | ORAL_TABLET | ORAL | Status: DC | PRN
  Administered 2022-03-27: 5 mg via ORAL
  Administered 2022-03-27 – 2022-03-28 (×2): 10 mg via ORAL
  Filled 2022-03-27 (×3): qty 2

## 2022-03-27 NOTE — Plan of Care (Signed)
  Problem: Sit to Stand Goal: LTG:  Patient will perform sit to stand in prep for activites of daily living with assistance level (OT) Description: LTG:  Patient will perform sit to stand in prep for activites of daily living with assistance level (OT) Outcome: Completed/Met Flowsheets (Taken 03/27/2022 1532) LTG: PT will perform sit to stand in prep for activites of daily living with assistance level: Minimal Assistance - Patient > 75%   Problem: RH Bathing Goal: LTG Patient will bathe all body parts with assist levels (OT) Description: LTG: Patient will bathe all body parts with assist levels (OT) Outcome: Completed/Met Flowsheets (Taken 03/27/2022 1532) LTG: Pt will perform bathing with assistance level/cueing: Minimal Assistance - Patient > 75%   Problem: RH Dressing Goal: LTG Patient will perform upper body dressing (OT) Description: LTG Patient will perform upper body dressing with assist, with/without cues (OT). Outcome: Completed/Met Goal: LTG Patient will perform lower body dressing w/assist (OT) Description: LTG: Patient will perform lower body dressing with assist, with/without cues in positioning using equipment (OT) Outcome: Completed/Met   Problem: RH Toileting Goal: LTG Patient will perform toileting task (3/3 steps) with assistance level (OT) Description: LTG: Patient will perform toileting task (3/3 steps) with assistance level (OT)  Outcome: Completed/Met   Problem: RH Toilet Transfers Goal: LTG Patient will perform toilet transfers w/assist (OT) Description: LTG: Patient will perform toilet transfers with assist, with/without cues using equipment (OT) Outcome: Completed/Met   Problem: RH Tub/Shower Transfers Goal: LTG Patient will perform tub/shower transfers w/assist (OT) Description: LTG: Patient will perform tub/shower transfers with assist, with/without cues using equipment (OT) Outcome: Completed/Met

## 2022-03-27 NOTE — Progress Notes (Addendum)
PROGRESS NOTE Kelly [name]  [id number] DOB: 1965/05/01 DOA: 03/13/2022 PCP: [facility]  Brief Narrative/Hospital Course:  57 y.o. 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 Arnoldo Morale, also has underlying history of dyslipidemia, high pretension and hypothyroidism.  She was initially admitted to the hospital for tumor resection subsequently sent to [facility] for rehab, she is currently undergoing radiation treatments following which she is scheduled for chemotherapy treatments.  [place] team was consulted on 03/24/2022 for hypotension and patient was found to have pneumonia at that time > patient was initiated on ceftriaxone and azithromycin.    Subjective: Seen and examined this morning resting comfortably.  Family at the bedside mild cough on deep breath Overnight patient has been afebrile doing well on room air  Last CBC stable WBC count    Assessment and Plan: Principal Problem:   Thoracic spinal cord injury, sequela ([place]) Active Problems:   Adjustment disorder with depressed mood   Glioblastoma multiforme (HCC)   Sepsis and hypotension due to pneumonia: Overall doing well, saturating well on RA,afebrile  wbc stable.  CT spine 2/5 noted scattered groundglass densities and peribronchovascular groundglass consolidation in both lungs concerning for multifocal pneumonia  we will cont w/ ceftriaxone and azithromycin  while here- on discharge can change to Azithromycin  500 mg daily and Omnicef 300 mg twice daily x 5 more days to complete the course. Blood culture reviewed from 3/4 NGTD, continue pulmonary support, IS, PT OT ambulation.  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 Mickeal Skinner following next continue Decadron, Neurontin, Vimpat, Keppra per primary team> Dr. Mickeal Skinner recommends to  decrease Decadron to 4 mg daily upon discharge.  Continue pain meds with oxy, Elavil. Undergoing radiation treatment and is scheduled for outpatient chemotherapy as well.  Continue PT OT and plan for [facility].  Continue plan as per neurosurgery team, she continues to have left lower extremity weakness along with possible urinary retention and neurogenic bladder  Neurogenic bladder: Continue Flomax, monitor voiding, if persisten mayl need Foley catheterization as it may not improve in the setting of T-spine metastasis, at this time primary prefers I/o cath,UA appears abnormal.  History of hypothyroidism: TSH checked and normal 1.1.  cortisol was also checked 3/5 and low 1.5 but she is on steroid ? Significance- will need fu OP. GERD continue PPI Hyperlipidemia continue statin Class I Obesity:Patient's Body mass index is 34.17 kg/m. : Will benefit with PCP follow-up, weight loss  healthy lifestyle and outpatient sleep evaluation.  DVT prophylaxis: Place TED hose Start: 03/19/22 0839 enoxaparin (LOVENOX) injection 40 mg Start: 03/14/22 1200 Code Status:   Code Status: Full Code Family Communication: plan of care discussed with patient at bedside. Patient status is: Inpatient because of pneumonia and malignancy Level of care:    Dispo: The patient is from: HOME            Anticipated disposition: tbd PER PRIMARY.  Patient informed she will be discharged Friday, [facility] will continue to follow while hospitalized Objective: Vitals last 24 hrs: Vitals:   03/26/22 0430 03/26/22 1442 03/26/22 1932 03/27/22 0409  BP: 120/75 128/84 (!) 137/95 (!) 143/94  Pulse: 83 96 89 95  Resp: '16 15 18 18  '$ Temp: 98.1 F (36.7 C) 98.5 F (36.9 C) 97.9 F (36.6 C) 98.1 F (36.7 C)  TempSrc: Oral  Oral   SpO2:  92% 94% 96% 91%  Weight:      Height:       Weight change:   Physical Examination: General exam: AA, weak,older appearing HEENT:Oral mucosa moist, Ear/Nose WNL grossly, dentition normal. Respiratory  system: bilaterally clear BS, no use of accessory muscle Cardiovascular system: S1 & S2 +, regular rate. Gastrointestinal system: Abdomen soft, NT,ND,BS+ Nervous System:Alert, awake, moving extremities and grossly nonfocal Extremities: LE ankle edema neg, lower extremities warm Skin:No rashes,no icterus. [id number] muscle bulk,tone, power   Medications reviewed:  Scheduled Meds:  amitriptyline  100 mg Oral QHS   atorvastatin  10 mg Oral Daily   calcium carbonate  400 mg of elemental calcium Oral TID   cyclobenzaprine  5-10 mg Oral QHS   dexamethasone  4 mg Oral Q12H   diclofenac Sodium  2 g Topical QID   enoxaparin (LOVENOX) injection  40 mg Subcutaneous Q24H   enoxaparin   Does not apply Once   escitalopram  10 mg Oral QHS   gabapentin  300 mg Oral TID   lacosamide  100 mg Oral BID   levETIRAcetam  1,500 mg Oral BID   melatonin  10 mg Oral QHS   pantoprazole  40 mg Oral BID   senna-docusate  2 tablet Oral QHS   sodium chloride  1 g Oral BID WC   tamsulosin  0.4 mg Oral QPC supper   Continuous Infusions:  azithromycin Stopped (03/26/22 1916)   cefTRIAXone (ROCEPHIN)  IV Stopped (03/26/22 1727)      Diet Order             Diet regular Room service appropriate? Yes; Fluid consistency: Thin  Diet effective now                  Intake/Output Summary (Last 24 hours) at 03/27/2022 1013 Last data filed at 03/27/2022 [id number] Gross per 24 hour  Intake 1877 ml  Output 1225 ml  Net 652 ml   Net IO Since Admission: -5,074 mL [03/27/22 1013]  Wt Readings from Last 3 Encounters:  03/13/22 90.3 kg  03/08/22 90.7 kg  03/03/22 90.2 kg     Unresulted Labs (From admission, onward)     Start     Ordered   03/17/22 [id number]  Basic metabolic panel  Weekly,   R      03/13/22 1722   03/17/22 0500  CBC  Weekly,   R      03/13/22 1722          Data Reviewed: I have personally reviewed following labs and imaging studies CBC: Recent Labs  Lab 03/24/22 0622 03/25/22 1032  WBC 9.3  9.9  HGB 16.5* 13.4  HCT 47.7* 38.3  MCV 102.6* 101.9*  PLT 217 0000000   Basic Metabolic Panel: Recent Labs  Lab 03/24/22 0622 03/25/22 1032  NA 132* 134*  K 4.3 4.1  CL 102 102  CO2 23 24  GLUCOSE 126* 99  BUN 20 18  CREATININE 0.71 0.74  CALCIUM 8.1* 8.3*  MG  --  2.1   GFR: Estimated Creatinine Clearance: 84.4 mL/min (by C-G formula based on SCr of 0.74 mg/dL). Liver Function Tests: Recent Labs  Lab 03/25/22 1032  AST 15  ALT 29  ALKPHOS 71  BILITOT 0.7  PROT 5.5*  ALBUMIN 2.2*  CBG: Recent Labs  Lab 03/24/22 1010  GLUCAP 126*   Recent Labs    03/25/22 1032  TSH 1.145   Sepsis Labs: Recent Labs  Lab 03/24/22 1108 03/24/22 1113  03/24/22 1332 03/25/22 1032  PROCALCITON 5.89  --   --  6.45  LATICACIDVEN  --  1.8 2.0*  --     Recent Results (from the past 240 hour(s))  Culture, blood (Routine X 2) w Reflex to ID Panel     Status: None (Preliminary result)   Collection Time: 03/24/22  1:32 PM   Specimen: BLOOD LEFT HAND  Result Value Ref Range Status   Specimen Description BLOOD LEFT HAND  Final   Special Requests   Final    BOTTLES DRAWN AEROBIC AND ANAEROBIC Blood Culture adequate volume   Culture   Final    NO GROWTH 3 DAYS Performed at [facility], [place] [address] [place], [place] [address]    Report Status PENDING  Incomplete  Culture, blood (Routine X 2) w Reflex to ID Panel     Status: None (Preliminary result)   Collection Time: 03/24/22  1:40 PM   Specimen: BLOOD LEFT WRIST  Result Value Ref Range Status   Specimen Description BLOOD LEFT WRIST  Final   Special Requests   Final    BOTTLES DRAWN AEROBIC ONLY Blood Culture adequate volume   Culture   Final    NO GROWTH 3 DAYS Performed at [facility], [address], [place], [place] [address]    Report Status PENDING  Incomplete  MRSA Next Gen by PCR, Nasal     Status: None   Collection Time: 03/25/22  6:51 AM   Specimen: Nasal Mucosa; Nasal Swab  Result Value Ref  Range Status   MRSA by PCR Next Gen NOT DETECTED NOT DETECTED Final    Comment: (NOTE) The GeneXpert MRSA Assay (FDA approved for NASAL specimens only), is one component of a comprehensive MRSA colonization surveillance program. It is not intended to diagnose MRSA infection nor to guide or monitor treatment for MRSA infections. Test performance is not FDA approved in patients less than 34 years old. Performed at [facility], [place] [address] [place], [place] [address]     Antimicrobials: Anti-infectives (From admission, onward)    Start     Dose/Rate Route Frequency Ordered Stop   03/24/22 1600  cefTRIAXone (ROCEPHIN) 2 g in sodium chloride 0.9 % 100 mL IVPB        2 g 200 mL/hr over 30 Minutes Intravenous Every 24 hours 03/24/22 1457     03/24/22 1600  azithromycin (ZITHROMAX) 500 mg in sodium chloride 0.9 % 250 mL IVPB        500 mg 250 mL/hr over 60 Minutes Intravenous Every 24 hours 03/24/22 1457        Culture/Microbiology    Component Value Date/Time   SDES BLOOD LEFT WRIST 03/24/2022 1340   SPECREQUEST  03/24/2022 1340    BOTTLES DRAWN AEROBIC ONLY Blood Culture adequate volume   CULT  03/24/2022 1340    NO GROWTH 3 DAYS Performed at [facility], [place] [address] [place], [place] [address]    REPTSTATUS PENDING 03/24/2022 1340  Radiology Studies: CT THORACIC SPINE WO CONTRAST  Result Date: 03/25/2022 CLINICAL DATA:  Metastatic glioblastoma to the thoracic spinal cord. Incomplete paraplegia with worsening right leg weakness. EXAM: CT THORACIC AND LUMBAR SPINE WITHOUT CONTRAST TECHNIQUE: Multidetector CT imaging of the thoracic and lumbar spine was performed without contrast. Multiplanar CT image reconstructions were also generated. RADIATION DOSE REDUCTION: This exam was performed according to the departmental dose-optimization program which includes automated exposure control, adjustment of the mA  and/or kV according to patient size and/or use of iterative  reconstruction technique. COMPARISON:  MR total spine dated March 07, 2022. FINDINGS: CT THORACIC SPINE FINDINGS Alignment: Normal. Vertebrae: No acute fracture or focal pathologic process. Paraspinal and other soft tissues: Scattered ground-glass densities and peribronchovascular ground-glass consolidation in both lungs with smooth interlobular septal thickening. Unchanged very large hiatal hernia containing stomach and colon. Disc levels: Focal round hyperdensity in the spinal canal at T6 measuring 1.3 x 1.1 cm, corresponding to the metastasis seen better on recent MRI. No significant disc bulge or herniation. CT LUMBAR SPINE FINDINGS Segmentation: 5 lumbar type vertebrae. Alignment: Normal. Vertebrae: No acute fracture or focal pathologic process. Paraspinal and other soft tissues: Aortoiliac atherosclerotic vascular disease. Sigmoid colonic diverticulosis. Disc levels: Mild disc bulging from L1-L2 through L4-L5. Lower lumbar facet arthropathy, moderate on the right at L5-S1. No spinal canal or neuroforaminal stenosis at any level. IMPRESSION: 1. Focal round hyperdensity in the spinal canal at T6 measuring 1.3 x 1.1 cm, corresponding to the metastasis seen better on recent MRI. 2. No acute osseous abnormality or significant degenerative changes in the thoracic or lumbar spine. 3. Scattered ground-glass densities and peribronchovascular ground-glass consolidation in both lungs, concerning for multifocal pneumonia. 4. Unchanged very large hiatal hernia containing stomach and colon. 5.  Aortic Atherosclerosis (ICD10-I70.0). Electronically Signed   By: Titus Dubin M.D.   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 EXAM: CT THORACIC AND LUMBAR SPINE WITHOUT CONTRAST TECHNIQUE: Multidetector CT imaging of the thoracic and lumbar spine was performed  without contrast. Multiplanar CT image reconstructions were also generated. RADIATION DOSE REDUCTION: This exam was performed according to the departmental dose-optimization program which includes automated exposure control, adjustment of the mA and/or kV according to patient size and/or use of iterative reconstruction technique. COMPARISON:  MR total spine dated March 07, 2022. FINDINGS: CT THORACIC SPINE FINDINGS Alignment: Normal. Vertebrae: No acute fracture or focal pathologic process. Paraspinal and other soft tissues: Scattered ground-glass densities and peribronchovascular ground-glass consolidation in both lungs with smooth interlobular septal thickening. Unchanged very large hiatal hernia containing stomach and colon. Disc levels: Focal round hyperdensity in the spinal canal at T6 measuring 1.3 x 1.1 cm, corresponding to the metastasis seen better on recent MRI. No significant disc bulge or herniation. CT LUMBAR SPINE FINDINGS Segmentation: 5 lumbar type vertebrae. Alignment: Normal. Vertebrae: No acute fracture or focal pathologic process. Paraspinal and other soft tissues: Aortoiliac atherosclerotic vascular disease. Sigmoid colonic diverticulosis. Disc levels: Mild disc bulging from L1-L2 through L4-L5. Lower lumbar facet arthropathy, moderate on the right at L5-S1. No spinal canal or neuroforaminal stenosis at any level. IMPRESSION: 1. Focal round hyperdensity in the spinal canal at T6 measuring 1.3 x 1.1 cm, corresponding to the metastasis seen better on recent MRI. 2. No acute osseous abnormality or significant degenerative changes in the thoracic or lumbar spine. 3. Scattered ground-glass densities and peribronchovascular ground-glass consolidation in both lungs, concerning for multifocal pneumonia. 4. Unchanged very large hiatal hernia containing stomach and colon. 5.  Aortic Atherosclerosis (ICD10-I70.0). Electronically Signed   By: Titus Dubin M.D.   On: 03/25/2022 18:15   CT HEAD WO  CONTRAST (5MM)  Result Date: 03/25/2022 CLINICAL DATA:  Slurred speech.  GBM. EXAM: CT HEAD WITHOUT CONTRAST CT CERVICAL SPINE WITHOUT CONTRAST TECHNIQUE: Multidetector CT imaging of the head and cervical spine was performed  following the standard protocol without intravenous contrast. Multiplanar CT image reconstructions of the cervical spine were also generated. RADIATION DOSE REDUCTION: This exam was performed according to the departmental dose-optimization program which includes automated exposure control, adjustment of the mA and/or kV according to patient size and/or use of iterative reconstruction technique. COMPARISON:  Head CT dated 03/01/2021. FINDINGS: CT HEAD FINDINGS Brain: Postoperative changes of the right temporal lobe with encephalomalacia. There is mild periventricular and deep white matter chronic microvascular ischemic changes. There is no acute intracranial hemorrhage. No mass effect or midline shift. No extra-axial fluid collection. Vascular: No hyperdense vessel or unexpected calcification. Skull: Right temporal craniotomy.  No acute calvarial pathology. Sinuses/Orbits: No acute finding. Other: None CT CERVICAL SPINE FINDINGS Alignment: No acute subluxation. There is straightening of normal cervical lordosis which may be positional or due to muscle spasm. Skull base and vertebrae: No acute fracture. Soft tissues and spinal canal: No prevertebral fluid or swelling. No visible canal hematoma. Disc levels:  No acute findings.  Degenerative changes. Upper chest: Patchy bilateral airspace densities concerning for pneumonia, possibly atypical in etiology. Clinical correlation is recommended. Other: Bilateral carotid bulb calcified plaques. IMPRESSION: 1. No acute intracranial pathology. 2. Postoperative changes of the right temporal lobe with encephalomalacia. 3. No acute/traumatic cervical spine pathology. 4. Patchy bilateral airspace densities concerning for pneumonia, possibly atypical in  etiology. Electronically Signed   By: Anner Crete M.D.   On: 03/25/2022 18:03   CT CERVICAL SPINE WO CONTRAST  Result Date: 03/25/2022 CLINICAL DATA:  Slurred speech.  GBM. EXAM: CT HEAD WITHOUT CONTRAST CT CERVICAL SPINE WITHOUT CONTRAST TECHNIQUE: Multidetector CT imaging of the head and cervical spine was performed following the standard protocol without intravenous contrast. Multiplanar CT image reconstructions of the cervical spine were also generated. RADIATION DOSE REDUCTION: This exam was performed according to the departmental dose-optimization program which includes automated exposure control, adjustment of the mA and/or kV according to patient size and/or use of iterative reconstruction technique. COMPARISON:  Head CT dated 03/01/2021. FINDINGS: CT HEAD FINDINGS Brain: Postoperative changes of the right temporal lobe with encephalomalacia. There is mild periventricular and deep white matter chronic microvascular ischemic changes. There is no acute intracranial hemorrhage. No mass effect or midline shift. No extra-axial fluid collection. Vascular: No hyperdense vessel or unexpected calcification. Skull: Right temporal craniotomy.  No acute calvarial pathology. Sinuses/Orbits: No acute finding. Other: None CT CERVICAL SPINE FINDINGS Alignment: No acute subluxation. There is straightening of normal cervical lordosis which may be positional or due to muscle spasm. Skull base and vertebrae: No acute fracture. Soft tissues and spinal canal: No prevertebral fluid or swelling. No visible canal hematoma. Disc levels:  No acute findings.  Degenerative changes. Upper chest: Patchy bilateral airspace densities concerning for pneumonia, possibly atypical in etiology. Clinical correlation is recommended. Other: Bilateral carotid bulb calcified plaques. IMPRESSION: 1. No acute intracranial pathology. 2. Postoperative changes of the right temporal lobe with encephalomalacia. 3. No acute/traumatic cervical spine  pathology. 4. Patchy bilateral airspace densities concerning for pneumonia, possibly atypical in etiology. Electronically Signed   By: Anner Crete M.D.   On: 03/25/2022 18:03     LOS: 20 days   Antonieta Pert, MD [facility]  03/27/2022, 10:13 AM

## 2022-03-27 NOTE — Progress Notes (Signed)
Inpatient Rehabilitation Discharge Medication Review by a Pharmacist  A complete drug regimen review was completed for this patient to identify any potential clinically significant medication issues.  High Risk Drug Classes Is patient taking? Indication by Medication  Antipsychotic Yes Compazine- N/V  Anticoagulant Yes Lovenox- vte ppx  Antibiotic No   Opioid Yes OxyIR- cancer related pain  Antiplatelet No   Hypoglycemics/insulin No   Vasoactive Medication Yes Flomax- urinary hesitancy Zestril- HTN  Chemotherapy Yes, Oral Chemotherapy Temodor- Gleoblastoma (5 days of 28 day cycle). Managed by oncology  Other Yes Lipitor- HLD Elavil- neuropathic pain Flexeril- muscle spasms Lexapro- MDD Neurontin- neuropathic pain Vimpat, keppra- seizure ppx Decadron- adjunct to cancer treatment Melatonin- sleep Protonix- GERD     Type of Medication Issue Identified Description of Issue Recommendation(s)  Drug Interaction(s) (clinically significant)     Duplicate Therapy     Allergy     No Medication Administration End Date     Incorrect Dose     Additional Drug Therapy Needed     Significant med changes from prior encounter (inform family/care partners about these prior to discharge).    Other       Clinically significant medication issues were identified that warrant physician communication and completion of prescribed/recommended actions by midnight of the next day:  No  Name of provider notified for urgent issues identified:   Provider Method of Notification:     Pharmacist comments:   Time spent performing this drug regimen review (minutes):  30   Eshal Propps BS, PharmD, BCPS Clinical Pharmacist 03/28/2022 9:05 AM  Contact: [phone] after 3 PM  "Be curious, not judgmental..." -Jamal Maes

## 2022-03-27 NOTE — Progress Notes (Signed)
Patient ID: Kelly [name], female   DOB: Nov 16, 1965, 57 y.o.   MRN: [id number]  SW met with pt and pt husband in room to discuss discharge to home tomorrow. Pt and husband agree with ambulance transport to home tomorrow. SW explained that transportation will be arranged for pick up at 3pm due to IV abx that will need to be administered,  insurance has a 3hr window to arrange, and would like to ensure hospital bed has arrived prior to discharge. Requested pick up time will be 11:30am. SW informed hospital bed has been ordered. Slide board has been delivered to her home. She is aware SW has ordered incontinence supplies.   SW faxed order and clinicals for incontinence supplies and catheters to [employer] (p:[phone]/[phone]).   *SW provided pt with 74f catheter samples.   ALoralee Pacas MSW, LCanonesOffice: [phone]: [phone]: [phone]

## 2022-03-27 NOTE — Progress Notes (Signed)
Occupational Therapy Discharge Summary  Patient Details  Name: Kelly [name] MRN: [id number] Date of Birth: 23-Jun-1965  Date of Discharge from [place] 7, 2024  Today's Date: 03/27/2022 OT Individual Time: 0900-1000 1st Session, 1415-1430 2nd session (15 min non-billable due to w/c eval co-tx) OT Individual Time Calculation (min): 60 min, 15 min    Patient has met 8 of 8 long term goals due to improved activity tolerance, improved balance, postural control, ability to compensate for deficits, improved awareness, and improved coordination.  Patient to discharge at [facility] Assist level.  Patient's care partner is independent to provide the necessary physical and cognitive assistance at discharge.    Reasons goals not met:n/a   Recommendation:  Patient will benefit from ongoing skilled OT services in home health setting to continue to advance functional skills in the area of BADL, iADL, and Reduce care partner burden.  Equipment: STEDY purchased by family, hospital bed, TTB, DABSC, TB   Reasons for discharge: treatment goals met  Patient/family agrees with progress made and goals achieved: Yes  OT Discharge Precautions/Restrictions  Precautions Precautions: Fall Precaution Comments: L LE hemplegia Restrictions Weight Bearing Restrictions: No General Chart Reviewed: Yes Vital Signs Therapy Vitals Temp: 98 F (36.7 C) Temp Source: Oral Pulse Rate: (!) 102 Resp: 16 BP: (!) 146/85 Patient Position (if appropriate): Lying Oxygen Therapy SpO2: 96 % O2 Device: Room Air Pain Pain Assessment Pain Scale: 0-10 Pain Score: 6  Pain Type: Acute pain Pain Location: Back Pain Orientation: Lower Pain Descriptors / Indicators: Aching Pain Onset: On-going Patients Stated Pain Goal: 0 Pain Intervention(s): Pain med given for lower pain score than stated, per patient request;Emotional support;Rest;Distraction;Repositioned Multiple Pain Sites: No ADL ADL Equipment  Provided: Long-handled sponge Eating: Independent Where Assessed-Eating: Chair Grooming: Independent Where Assessed-Grooming: Sitting at sink Upper Body Bathing: Modified independent Where Assessed-Upper Body Bathing: Shower, Sitting at sink Lower Body Bathing: Supervision/safety Where Assessed-Lower Body Bathing: Shower Upper Body Dressing: Modified independent (Device) Where Assessed-Upper Body Dressing: Sitting at sink Lower Body Dressing: Minimal assistance Where Assessed-Lower Body Dressing: Edge of bed, Sitting at sink Toileting: Minimal assistance Where Assessed-Toileting: Bedside Commode, Control and instrumentation engineer Transfer: Minimal assistance Armed forces technical officer Method: Squat pivot, Transfer board, Other (comment) (STEDY) Toilet Transfer Equipment: Bedside commode, Drop arm bedside commode, Grab bars Tub/Shower Transfer: Minimal assistance Tub/Shower Transfer Method: Administrator, arts: Radio broadcast assistant, Other (comment) (STEDY) Walk-In Shower Transfer: Maximal assistance (stedy) Social research officer, government Method: Other (comment) (stedy) Walk-In Shower Equipment: Transfer tub bench ADL Comments: Pt and husband trained in final ADL session. Pt will have hospital bed, STEDY, TB, TTB, DABSC. Vision Baseline Vision/History: 1 Wears glasses Patient Visual Report: No change from baseline Vision Assessment?: No apparent visual deficits Perception  Perception: Impaired Inattention/Neglect: Impaired-to be further tested in functional context Praxis Praxis: Impaired Praxis Impairment Details: Motor planning Cognition Cognition Overall Cognitive Status: History of cognitive impairments - at baseline Arousal/Alertness: Awake/alert Orientation Level: Person;Place;Situation Memory: Impaired Memory Impairment: Decreased short term memory;Decreased recall of new information;Retrieval deficit Decreased Short Term Memory: Verbal complex;Functional complex Attention:  Focused;Selective;Sustained;Alternating Focused Attention: Appears intact Sustained Attention: Appears intact Selective Attention: Appears intact Selective Attention Impairment: Verbal complex;Functional complex Awareness Impairment: Anticipatory impairment Problem Solving: Impaired Problem Solving Impairment: Verbal complex Safety/Judgment: Impaired Comments: Pt impulsive in movements and often overestimating abilities Brief Interview for Mental Status (BIMS) Repetition of Three Words (First Attempt): 3 Temporal Orientation: Year: Correct Temporal Orientation: Month: Accurate within 5 days Temporal Orientation: Day: Correct Recall: "Sock": Yes, no cue  required Recall: "Blue": Yes, no cue required Recall: "Bed": Yes, no cue required BIMS Summary Score: 15 Sensation Sensation Light Touch: Impaired Detail Light Touch Impaired Details: Absent LLE;Impaired RLE Hot/Cold: Appears Intact Proprioception: Impaired Detail Proprioception Impaired Details: Impaired LLE Additional Comments: Proprioception impaired as pt fatigued it worsened Coordination Gross Motor Movements are Fluid and Coordinated: No Fine Motor Movements are Fluid and Coordinated: Yes Coordination and Movement Description: Limited by LLE hemiplegia and grossly dyscoordinated/ataxic Finger Nose Finger Test: [facility] 9 Hole Peg Test: no deficits Motor  Motor Motor: Ataxia;Abnormal tone;Other (comment);Abnormal postural alignment and control Motor - Skilled Clinical Observations: limited by flaccid LLE and deteriorating RLE Mobility  Bed Mobility Bed Mobility: Supine to Sit;Sit to Supine Supine to Sit: Minimal Assistance - Patient > 75% Sit to Supine: Minimal Assistance - Patient > 75% Transfers Sit to Stand: Minimal Assistance - Patient > 75% Stand to Sit: Minimal Assistance - Patient > 75%  Trunk/Postural Assessment  Cervical Assessment Cervical Assessment: Within Functional Limits Thoracic Assessment Thoracic  Assessment: Within Functional Limits Lumbar Assessment Lumbar Assessment: Within Functional Limits Postural Control Postural Control: Deficits on evaluation  Balance Balance Balance Assessed: Yes Static Sitting Balance Static Sitting - Balance Support: Feet supported Static Sitting - Level of Assistance: 5: Stand by assistance Dynamic Sitting Balance Dynamic Sitting - Balance Support: During functional activity;Feet supported Dynamic Sitting - Level of Assistance: 4: Min Insurance risk surveyor Standing - Balance Support: Bilateral upper extremity supported Static Standing - Level of Assistance: 3: Mod assist Dynamic Standing Balance Dynamic Standing - Balance Support: During functional activity;Bilateral upper extremity supported Dynamic Standing - Level of Assistance: 2: Max assist Extremity/Trunk Assessment RUE Assessment RUE Assessment: Within Functional Limits   OT Treatment/intervention:  Session 1:  Pt seen for am OT session focussed on Family Education with pt's husband Suezanne Jacquet who will be primary caregivers for pt along with dtr. Pt bed level upon OT arrival and OT introduced role and goals as well as recapped pt's progress thus far and current functional level. OT provided general education on d/c equipment needs and strategies for ADL's hospital bed, w/, commode and TTB level, OT trained pt and family on safe use of STEDY.  Demonstration and training held in pt's bedside for STEDY use, positioning and bed level as well as EOB, commode, w/c level ADL's with full AM self care training incl TED hose donning. OT instructed in safe toileting, bathing, dressing and grooming seated and STEDY supported standing sinkside. Educated on basic UE HEP and provided tbands, tputty and foam grasp cube trainer with + pt teach back.  Husband had no further questions and reported + understanding of information presented. Pt requested to be left up in w/c and OT placed and activated chair  alarm and provided needs and call bell in reach.  Session 2:  Pt seen this session with focus on wheelchair assessment based on functional needs, ADL's, mobility and home needs and accessibility. Pt in bed upon OT arrival with PT already assisting pt to move to EOB sitting balance, posture. OT requested pt to direct care as if family or new care worker was assisting and OT facilitated transfer board training bed to loaner manual w/c. Pt required min A overall and min cues for sequencing steps. Corene Cornea- rep from [facility] present to discuss funding, pt's goals and needs for seating and assist pt and team with appropriate custom evaluation. Pt has no current significant limitations with upper body/coordination other than truncal weakness and postural  deficits thus primarily will focus on lower body needs as well as pressure relief capabilities due to sensory motor deficits related to spinal issues. OT left vendor and PT at end of session to complete assessment.   Barnabas Lister 03/27/2022, 7:29 PM

## 2022-03-27 NOTE — Progress Notes (Signed)
PROGRESS NOTE   Subjective/Complaints:  Pt reports  really tired- didn't go to sleep til 2am Back hurting- mid back- getting oxy scheduled- will change to prn 5-10 mg q4 hours prn  Asking for heating pad.   Asked if going home on lovenox -will need for 1 m ore month- pt needs teaching-   LBM yesterday- needs miralax at home.  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 EXAM: CT THORACIC AND LUMBAR SPINE WITHOUT CONTRAST TECHNIQUE: Multidetector CT imaging of the thoracic and lumbar spine was performed without contrast. Multiplanar CT image reconstructions were also generated. RADIATION DOSE REDUCTION: This exam was performed according to the departmental dose-optimization program which includes automated exposure control, adjustment of the mA and/or kV according to patient size and/or use of iterative reconstruction technique. COMPARISON:  MR total spine dated March 07, 2022. FINDINGS: CT THORACIC SPINE FINDINGS Alignment: Normal. Vertebrae: No acute fracture or focal pathologic process. Paraspinal and other soft tissues: Scattered ground-glass densities and peribronchovascular ground-glass consolidation in both lungs with smooth interlobular septal thickening. Unchanged very large hiatal hernia containing stomach and colon. Disc levels: Focal round hyperdensity in the spinal canal at T6 measuring 1.3 x 1.1 cm, corresponding to the metastasis seen better on recent MRI. No significant disc bulge or herniation. CT LUMBAR SPINE FINDINGS Segmentation: 5 lumbar type vertebrae. Alignment: Normal. Vertebrae: No acute fracture or focal pathologic process. Paraspinal and other soft tissues: Aortoiliac  atherosclerotic vascular disease. Sigmoid colonic diverticulosis. Disc levels: Mild disc bulging from L1-L2 through L4-L5. Lower lumbar facet arthropathy, moderate on the right at L5-S1. No spinal canal or neuroforaminal stenosis at any level. IMPRESSION: 1. Focal round hyperdensity in the spinal canal at T6 measuring 1.3 x 1.1 cm, corresponding to the metastasis seen better on recent MRI. 2. No acute osseous abnormality or significant degenerative changes in the thoracic or lumbar spine. 3. Scattered ground-glass densities and peribronchovascular ground-glass consolidation in both lungs, concerning for multifocal pneumonia. 4. Unchanged very large hiatal hernia containing stomach and colon. 5.  Aortic Atherosclerosis (ICD10-I70.0). Electronically Signed   By: Titus Dubin M.D.   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 EXAM: CT THORACIC AND LUMBAR SPINE WITHOUT CONTRAST TECHNIQUE: Multidetector CT imaging of the thoracic and lumbar spine was performed without contrast. Multiplanar CT image reconstructions were also generated. RADIATION DOSE REDUCTION: This exam was performed according to the departmental dose-optimization program which includes automated exposure control, adjustment of the mA and/or kV according to patient size and/or use of iterative reconstruction technique. COMPARISON:  MR total spine dated March 07, 2022. FINDINGS: CT THORACIC SPINE FINDINGS Alignment: Normal. Vertebrae: No acute fracture or focal pathologic process. Paraspinal and other soft tissues: Scattered ground-glass densities and peribronchovascular ground-glass consolidation in both lungs with smooth interlobular septal thickening. Unchanged very large hiatal hernia containing  stomach and colon. Disc levels: Focal round hyperdensity in the spinal canal at T6 measuring  1.3 x 1.1 cm, corresponding to the metastasis seen better on recent MRI. No significant disc bulge or herniation. CT LUMBAR SPINE FINDINGS Segmentation: 5 lumbar type vertebrae. Alignment: Normal. Vertebrae: No acute fracture or focal pathologic process. Paraspinal and other soft tissues: Aortoiliac atherosclerotic vascular disease. Sigmoid colonic diverticulosis. Disc levels: Mild disc bulging from L1-L2 through L4-L5. Lower lumbar facet arthropathy, moderate on the right at L5-S1. No spinal canal or neuroforaminal stenosis at any level. IMPRESSION: 1. Focal round hyperdensity in the spinal canal at T6 measuring 1.3 x 1.1 cm, corresponding to the metastasis seen better on recent MRI. 2. No acute osseous abnormality or significant degenerative changes in the thoracic or lumbar spine. 3. Scattered ground-glass densities and peribronchovascular ground-glass consolidation in both lungs, concerning for multifocal pneumonia. 4. Unchanged very large hiatal hernia containing stomach and colon. 5.  Aortic Atherosclerosis (ICD10-I70.0). Electronically Signed   By: Titus Dubin M.D.   On: 03/25/2022 18:15   CT HEAD WO CONTRAST (5MM)  Result Date: 03/25/2022 CLINICAL DATA:  Slurred speech.  GBM. EXAM: CT HEAD WITHOUT CONTRAST CT CERVICAL SPINE WITHOUT CONTRAST TECHNIQUE: Multidetector CT imaging of the head and cervical spine was performed following the standard protocol without intravenous contrast. Multiplanar CT image reconstructions of the cervical spine were also generated. RADIATION DOSE REDUCTION: This exam was performed according to the departmental dose-optimization program which includes automated exposure control, adjustment of the mA and/or kV according to patient size and/or use of iterative reconstruction technique. COMPARISON:  Head CT dated 03/01/2021. FINDINGS: CT HEAD FINDINGS Brain: Postoperative changes of the right temporal lobe with encephalomalacia. There is mild periventricular and deep white  matter chronic microvascular ischemic changes. There is no acute intracranial hemorrhage. No mass effect or midline shift. No extra-axial fluid collection. Vascular: No hyperdense vessel or unexpected calcification. Skull: Right temporal craniotomy.  No acute calvarial pathology. Sinuses/Orbits: No acute finding. Other: None CT CERVICAL SPINE FINDINGS Alignment: No acute subluxation. There is straightening of normal cervical lordosis which may be positional or due to muscle spasm. Skull base and vertebrae: No acute fracture. Soft tissues and spinal canal: No prevertebral fluid or swelling. No visible canal hematoma. Disc levels:  No acute findings.  Degenerative changes. Upper chest: Patchy bilateral airspace densities concerning for pneumonia, possibly atypical in etiology. Clinical correlation is recommended. Other: Bilateral carotid bulb calcified plaques. IMPRESSION: 1. No acute intracranial pathology. 2. Postoperative changes of the right temporal lobe with encephalomalacia. 3. No acute/traumatic cervical spine pathology. 4. Patchy bilateral airspace densities concerning for pneumonia, possibly atypical in etiology. Electronically Signed   By: Anner Crete M.D.   On: 03/25/2022 18:03   CT CERVICAL SPINE WO CONTRAST  Result Date: 03/25/2022 CLINICAL DATA:  Slurred speech.  GBM. EXAM: CT HEAD WITHOUT CONTRAST CT CERVICAL SPINE WITHOUT CONTRAST TECHNIQUE: Multidetector CT imaging of the head and cervical spine was performed following the standard protocol without intravenous contrast. Multiplanar CT image reconstructions of the cervical spine were also generated. RADIATION DOSE REDUCTION: This exam was performed according to the departmental dose-optimization program which includes automated exposure control, adjustment of the mA and/or kV according to patient size and/or use of iterative reconstruction technique. COMPARISON:  Head CT dated 03/01/2021. FINDINGS: CT HEAD FINDINGS Brain: Postoperative changes  of the right temporal lobe with encephalomalacia. There is mild periventricular and deep white matter chronic microvascular ischemic changes. There is no acute intracranial hemorrhage. No mass effect  or midline shift. No extra-axial fluid collection. Vascular: No hyperdense vessel or unexpected calcification. Skull: Right temporal craniotomy.  No acute calvarial pathology. Sinuses/Orbits: No acute finding. Other: None CT CERVICAL SPINE FINDINGS Alignment: No acute subluxation. There is straightening of normal cervical lordosis which may be positional or due to muscle spasm. Skull base and vertebrae: No acute fracture. Soft tissues and spinal canal: No prevertebral fluid or swelling. No visible canal hematoma. Disc levels:  No acute findings.  Degenerative changes. Upper chest: Patchy bilateral airspace densities concerning for pneumonia, possibly atypical in etiology. Clinical correlation is recommended. Other: Bilateral carotid bulb calcified plaques. IMPRESSION: 1. No acute intracranial pathology. 2. Postoperative changes of the right temporal lobe with encephalomalacia. 3. No acute/traumatic cervical spine pathology. 4. Patchy bilateral airspace densities concerning for pneumonia, possibly atypical in etiology. Electronically Signed   By: Anner Crete M.D.   On: 03/25/2022 18:03   Recent Labs    03/25/22 1032  WBC 9.9  HGB 13.4  HCT 38.3  PLT 160    Recent Labs    03/25/22 1032  NA 134*  K 4.1  CL 102  CO2 24  GLUCOSE 99  BUN 18  CREATININE 0.74  CALCIUM 8.3*     Intake/Output Summary (Last 24 hours) at 03/27/2022 0834 Last data filed at 03/27/2022 [id number] Gross per 24 hour  Intake 1877 ml  Output 1225 ml  Net 652 ml        Physical Exam: Vital Signs Blood pressure (!) 143/94, pulse 95, temperature 98.1 F (36.7 C), resp. rate 18, height '5\' 4"'$  (1.626 m), weight 90.3 kg, SpO2 91 %.       General: awake, alert, appropriate, NAD HENT: conjugate gaze; oropharynx moist CV:  regular rate; no JVD Pulmonary: CTA B/L; no W/R/R- good air movement GI: soft, NT, ND, (+)BS Psychiatric: appropriate Neurological: Ox3  - decreased to light touch on R foot still noted- Can now do DF 2-/5 and PF 2-/5 on L foot, noted again today RLE- HF 3-/5; KE/KF 3/5; and DF 4-/5 and PF 4/5  Prior exam Strength:                RUE: 5/5 SA, 5/5 EF, 5/5 EE, 5/5 WE, 5/5 FF, 5/5 FA                 LUE: 5/5 SA, 5/5 EF, 5/5 EE, 5/5 WE, 5/5 FF, 5/5 FA                 RLE: 5/5 HF, 5/5 KE, 5/5 DF, 5/5 EHL, 5/5 PF                 LLE:  2/5 HF, 1+/5 KE, 0/5 DF, 3/5 EHL, 1/5 PF  Neurologic exam: Strength able to lift bilateral upper extremity and right lower extremity to gravity, unable to lift left lower extremity to gravity, alert and awake, no dysarthria, patchy sensory loss approximately T5 and below on the left.,  Cranial nerves II through XII grossly intact, no abnormal tone   Assessment/Plan: 1. Functional deficits which require 3+ hours per day of interdisciplinary therapy in a comprehensive inpatient rehab setting. Physiatrist is providing close team supervision and 24 hour management of active medical problems listed below. Physiatrist and rehab team continue to assess barriers to discharge/monitor patient progress toward functional and medical goals  Care Tool:  Bathing    Body parts bathed by patient: Left upper leg, Right arm, Left arm, Right lower leg, Chest, Abdomen, Left  lower leg, Front perineal area, Face, Buttocks, Right upper leg         Bathing assist Assist Level: Moderate Assistance - Patient 50 - 74%     Upper Body Dressing/Undressing Upper body dressing   What is the patient wearing?: Pull over shirt    Upper body assist Assist Level: Contact Guard/Touching assist    Lower Body Dressing/Undressing Lower body dressing      What is the patient wearing?: Underwear/pull up, Pants     Lower body assist Assist for lower body dressing: Moderate Assistance -  Patient 50 - 74%     Toileting Toileting    Toileting assist Assist for toileting: Moderate Assistance - Patient 50 - 74%     Transfers Chair/bed transfer  Transfers assist     Chair/bed transfer assist level: Minimal Assistance - Patient > 75% (slideboard)     Locomotion Ambulation   Ambulation assist   Ambulation activity did not occur: Safety/medical concerns          Walk 10 feet activity   Assist  Walk 10 feet activity did not occur: Safety/medical concerns        Walk 50 feet activity   Assist Walk 50 feet with 2 turns activity did not occur: Safety/medical concerns         Walk 150 feet activity   Assist Walk 150 feet activity did not occur: Safety/medical concerns         Walk 10 feet on uneven surface  activity   Assist Walk 10 feet on uneven surfaces activity did not occur: Safety/medical concerns         Wheelchair     Assist Is the patient using a wheelchair?: Yes Type of Wheelchair: Manual    Wheelchair assist level: Supervision/Verbal cueing Max wheelchair distance: 120    Wheelchair 50 feet with 2 turns activity    Assist        Assist Level: Supervision/Verbal cueing   Wheelchair 150 feet activity     Assist      Assist Level: Moderate Assistance - Patient 50 - 74%   Blood pressure (!) 143/94, pulse 95, temperature 98.1 F (36.7 C), resp. rate 18, height '5\' 4"'$  (1.626 m), weight 90.3 kg, SpO2 91 %.  Medical Problem List and Plan: 1. 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  Antithrombotics: -DVT/anticoagulation:  Pharmaceutical: Lovenox '40mg'$  QD 3/7- will send home on lovenox- will need for 1 more month- needs lovenox teaching             -antiplatelet therapy: none   3. Pain Management:  -continue oxycodone 10 mg 5 x daily -continue Flexeril 5-10 mg q HS -continue Voltaren gel 2g QID, Advil '400mg'$  q6h PRN and Tylenol as needed, Robaxin '500mg'$  q6h PRN -Consider adjusting oxycodone to  as needed   -2/28  will add gabapentin 300 mg QHS and increase again on Friday to TID  -2/29- tolerating well- no side effects  3/4- will increase oxycodone to 15 mg 5x/day- said also taking ibuprofen as needed  3/5- changed pain meds to prn and will take away ibuprofen due to chest pain  3/7- Will restarted on Oxy scheduled- will change to 5-10 mg q 4 hours PRN- and add Kpad 4. Mood/Behavior/Sleep: LCSW to evaluate and provide emotional support             -antipsychotic agents: n/a             -continue Lexapro '10mg'$  QHS and Elavil '100mg'$  QHS  -Trazodone 25-'50mg'$  QHS PRN  -03/15/22 start '6mg'$  melatonin QHS-helping 03/16/22, continue   -2/27- still waking up ~ 4am- but doesn't want to change meds yet  -2/29- waking up early, but no med changes per pt.   -03/23/22 still moderately poor sleep but doesn't want med changes 5. Neuropsych/cognition: This patient is capable of making decisions on her own behalf.   6. Skin/Wound Care: Routine skin care checks   7. Fluids/Electrolytes/Nutrition: Routine Is and Os and follow-up chemistries, next 03/17/22  -03/15/22 Na 134 noted 03/14/22, monitor on following labs next week -2/26 Bun up to 24, pt plans to have yeti cup brought over by family to increase fluid intake, recheck Wednesday for azotemia   -2/28- BUN down to 17 from 24- will con't to monitor weekly. Next 03/24/22 8: Hypertension: monitor TID and prn             -continue  lisinopril 10 mg daily  3/5- BP much better than yesterday when was 60/40s- is up to 105/67- on IVFs still- thank you internal medicine  3/6- d/c;d IVFs- BP running 120s/70s-80s- con't off Lisinopril Vitals:   03/25/22 0130 03/25/22 0200 03/25/22 0400 03/25/22 0430  BP: 91/62 (!) 94/55 (!) 91/55 105/67   03/25/22 0500 03/25/22 0530 03/25/22 1552 03/25/22 2205  BP: 101/67 101/64 (!) 139/117 127/83   03/26/22 0430 03/26/22 1442 03/26/22 1932 03/27/22 0409  BP: 120/75 128/84 (!) 137/95 (!) 143/94       9: Glioblastoma s/p resection, 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:             -continue Decadron 6 mg q 6 hours -continue Keppra 1500 mg BID and Vimpat 50 mg BID for seizure ppx             - XRT x10 followed by oral chemotherapy   -2/27- to start Radiation today at 2:30 per chart  -2/28 no change to Decadron dosing  -3/1- XRT going well per pt.  10: Obesity, Class III- BMI 34             - Complicates medical decision making; nutrition onboard   11: GI prophylaxis: Protonix '40mg'$  BID   3/4- having a lot of reflux Sx's but on Protonix BID 40 mg- and allergic to Zantac- anaphylaxis- no other choices- but will ask pt to avoid ibuprofen if at all possible. .  12: Constipation vs neurogenic bowel: Given smog enema yesterday             -Senokot 2tabs QHS             -Miralax 17g BID  -PRNs: MoM, Fleet's, Sorbitol 38m -2/23 had a large bowel movement yesterday, had bowel movements the day prior also, continue to monitor -3/1- will make Miralax prn since having a lot of bowel incontinence-  due to loose stools coupled with lack of sensation.  -03/22/22 LBM this morning, no incontinence documented but pt states lack of sensation still present; monitor 3/4- Having regular BM's- LBM last night 13. Urinary urgency vs. Neurogenic bladder. Continent.              - UA 2/22 negative except for small HgB -2/29- going well- no inconitnence and retaining small amounts, but not enough to need cathing  3/5- asking pt to  be bladder scanned- pending results.  14.  Leukocytosis- new pneumonia -Likely due to Decadron/hemoconcentration, no signs of infection -03/17/22 WNL WBC, monitor weekly, next 03/24/22  3/4- WBC 9.3k  3/5- has a new pneumonia based on CXR- L side- on Zithromax and Rocephin IV- spoke with lab to get labs drawn at 10am- had tried, but was in therapy and bathroom- U/A checked- trace leuks, but otherwise completely negative  3/7- will finish Rocephin tomorrow- but will move earlier so can leave by noon; also will finish Zithromax IV today per pharmacy-  15. Hyponatremia   -Start salt tabs BID, recheck  Wednesday -2/28- Na 134- doing better- con't regimen for now, monitor weekly next on 03/24/22  3/4- Na 132- overall stable  3/7- last Na 134 16. Sepsis 3/4  3/5- had hypotension  down to 60s/40s and lactic acidosis- is s/p 2 L fast IVFs and now on 100cc/hour- labs pending to see how things going. Wasn't transferred- did stabilize- but monitoring closely. Of note, pt eats lying down with hiatal hernia-  17. RLE weakness  3/5- will check CT of cervical, thoracic and lumbar spine since doing worse-   3/6- nothing stands out as cause, but is still the biggest issue/barrier as well as LLE weakness 18. Urinary retention  3/7- is retaining urine in 200-s300-s- rarely needing caths HOWEVER puts her at high risk for UTI- will send home with cath supplies to cath up to 2x/day as needed for not emptying- did start Flomax to help this  0.4 mg q supper   I spent a total of  53  minutes on total care today- >50% coordination of care- due to  D/w nursing about pain meds and changed them; also d/w PA about ABX- to finish tomorrow- will move Rocephin to earlier so can leave by -also d/w pharmacy- about ABX- d/w pt about lovenox   NEEDS RX for Miralax when goes home.  Also decadron needs to be 4 mg daily at d/c.    LOS: 14 days A FACE TO FACE EVALUATION WAS PERFORMED  Abner Ardis 03/27/2022, 8:34 AM

## 2022-03-27 NOTE — Progress Notes (Signed)
Physical Therapy Discharge Summary  Patient Details  Name: Kelly [name] MRN: [id number] Date of Birth: October 22, 1965  Date of Discharge from PT service:March 27, 2022  Today's Date: 03/27/2022 PT Individual Time: 1100-1145 PT Individual Time Calculation (min): 45 min    Patient has met 4 of 8 long term goals due to improved activity tolerance, improved balance, and ability to compensate for deficits.  Patient to discharge at a wheelchair level Kelly [name].   Patient's care partner is independent to provide the necessary physical assistance at discharge. [name] has undergone hands on family training and is able to provide appropriate transfer assist. Pt privately purchased a stedy for home and plans to use for bulk of transfers.   Reasons goals not met: Pt has experienced increasing RLE weakness, increasing pain and fatigue as result of her condition and radiation. Pt's condition is deteriorating so d/c earlier than initial  ELOS.   Recommendation:  Patient will benefit from ongoing skilled PT services in home health setting to continue to advance safe functional mobility, address ongoing impairments in strength, balance, transfer mechanics, and minimize fall risk.  Equipment: Slideboard, w/c through [facility]  Reasons for discharge: treatment goals met and discharge from hospital  Patient/family agrees with progress made and goals achieved: Yes  Skilled Therapeutic Interventions/Progress Updates:  Session 1: Pt seated in w/c on arrival. Pt reports increasing pain, premedicated. Rest and positioning provided as needed. Pt also reports extreme fatigue from poor sleep and radiation, so requesting to rest this session. Session focused on family and pt education regarding equipment, transfer safety, and d/c plan. Pt navigated w/c and set up for transfer with cueing. Pt and husband performed slideboard transfer with therapist supervision and cueing. Sit>supine with min a for LLE  management. Pt was able to reposition in bed without assist. Pt then remained in bed and was left with all needs in reach and alarm active. Pt missed 15 min of scheduled therapy d/t fatigue.   Session 2: Pt seen as co treat with OT for first 30 min of treat for w/c eval. Reports intermittent pain in back, positioning as needed. Pt performed supine>sit with min A for LLE management and cueing. Pt then performed min A slideboard transfer to R side. Continued education about transfer mechanics and safety. Pt transported to therapy gym for time management and energy conservation. Session focused on w/c eval with Kelly [name] present. Plan to order K0005 lightweight chair for improved functional mobility and independence in the home. Pt propelled loaner chair short distance but reports that pushrims are slippery. Added theraband for added grip, pt reports much improved while returning to room. Pt educated on lateral leans for pressure relief which will become more important with further sensation loss, at this time pt is still repositioning frequently for comfort. Pt then states she urgently needs to use the bathroom. Pt returned to bed with min A slideboard transfer to L side. Frequent cueing for appropriate head hips relationship and lifting hips vs sliding. Pt then handed off to NT to assist with toileting per pt request.  PT Discharge Precautions/Restrictions Precautions Precautions: Fall Precaution Comments: L LE hemplegia Restrictions Weight Bearing Restrictions: No  Pain Interference Pain Interference Pain Effect on Sleep: 1. Rarely or not at all Pain Interference with Therapy Activities: 1. Rarely or not at all Pain Interference with Day-to-Day Activities: 1. Rarely or not at all Vision/Perception  Vision - History Ability to See in Adequate Light: 0 Adequate Perception Perception: Impaired Inattention/Neglect:  Impaired-to be further tested in functional context Praxis Praxis:  Impaired Praxis Impairment Details: Motor planning  Cognition Overall Cognitive Status: History of cognitive impairments - at baseline Arousal/Alertness: Awake/alert Orientation Level: Oriented X4 Year: 2024 Month: March Day of Week: Incorrect Memory: Impaired Memory Impairment: Decreased short term memory;Decreased recall of new information;Retrieval deficit Awareness: Impaired Problem Solving: Impaired Safety/Judgment: Impaired Comments: Pt impulsive in movements and often overestimating abilities Sensation Sensation Light Touch: Impaired Detail Light Touch Impaired Details: Absent LLE;Impaired RLE Additional Comments: Proprioception impaired as pt fatigued it worsened Coordination Gross Motor Movements are Fluid and Coordinated: No Coordination and Movement Description: Limited by LLE hemiplegia and grossly dyscoordinated/ataxic Motor  Motor Motor: Ataxia;Abnormal tone;Other (comment);Abnormal postural alignment and control Motor - Skilled Clinical Observations: limited by flaccid LLE and deteriorating RLE  Mobility Bed Mobility Bed Mobility: Supine to Sit;Sit to Supine Supine to Sit: Minimal Assistance - Patient > 75% Sit to Supine: Minimal Assistance - Patient > 75% Transfers Sit to Stand: Minimal Assistance - Patient > 75% (with stedy) Stand to Sit: Minimal Assistance - Patient > 75% (with stedy) Locomotion  Gait Ambulation: No Gait Gait: No Stairs / Additional Locomotion Stairs: No Ramp: Supervision/Verbal cueing Pick up small object from the floor (from standing position) activity did not occur: Safety/medical concerns Product manager Mobility: Yes Wheelchair Assistance: Chartered loss adjuster: Both upper extremities Wheelchair Parts Management: Supervision/cueing Distance: 300 ft  Trunk/Postural Assessment  Cervical Assessment Cervical Assessment: Within Functional Limits Thoracic Assessment Thoracic Assessment:  Within Functional Limits Lumbar Assessment Lumbar Assessment: Within Functional Limits Postural Control Postural Control: Deficits on evaluation  Balance Balance Balance Assessed: Yes Static Sitting Balance Static Sitting - Balance Support: Feet supported Static Sitting - Level of Assistance: 5: Stand by assistance Dynamic Sitting Balance Dynamic Sitting - Balance Support: During functional activity;Feet supported Dynamic Sitting - Level of Assistance: 4: Min Insurance risk surveyor Standing - Balance Support: Bilateral upper extremity supported Static Standing - Level of Assistance: 3: Mod assist Dynamic Standing Balance Dynamic Standing - Balance Support: During functional activity;Bilateral upper extremity supported Dynamic Standing - Level of Assistance: 2: Max assist Extremity Assessment      RLE Assessment RLE Assessment: Exceptions to [facility] General Strength Comments: Grossly 3+/5 LLE Assessment LLE Assessment: Exceptions to [facility] General Strength Comments: knee and hip 0/5, ankle DF 1/5   Kelly [name] C Kelly [name] 03/27/2022, 12:05 PM

## 2022-03-28 ENCOUNTER — Other Ambulatory Visit (HOSPITAL_COMMUNITY): Payer: Self-pay

## 2022-03-28 DIAGNOSIS — R6520 Severe sepsis without septic shock: Secondary | ICD-10-CM

## 2022-03-28 MED ORDER — ENOXAPARIN SODIUM 40 MG/0.4ML IJ SOSY
40.0000 mg | PREFILLED_SYRINGE | INTRAMUSCULAR | 0 refills | Status: AC
  Filled 2022-03-28: qty 11.2, 28d supply, fill #0

## 2022-03-28 MED ORDER — LACOSAMIDE 100 MG PO TABS
100.0000 mg | ORAL_TABLET | Freq: Two times a day (BID) | ORAL | 0 refills | Status: DC
  Filled 2022-03-28: qty 60, 30d supply, fill #0

## 2022-03-28 MED ORDER — DICLOFENAC SODIUM 1 % EX GEL
2.0000 g | Freq: Four times a day (QID) | CUTANEOUS | 1 refills | Status: DC
  Filled 2022-03-28: qty 100, 13d supply, fill #0

## 2022-03-28 MED ORDER — TAMSULOSIN HCL 0.4 MG PO CAPS
0.4000 mg | ORAL_CAPSULE | Freq: Every day | ORAL | 0 refills | Status: DC
  Filled 2022-03-28: qty 30, 30d supply, fill #0

## 2022-03-28 MED ORDER — CALCIUM CARBONATE ANTACID 500 MG PO CHEW
400.0000 mg | CHEWABLE_TABLET | Freq: Three times a day (TID) | ORAL | 0 refills | Status: DC
  Filled 2022-03-28: qty 180, 30d supply, fill #0

## 2022-03-28 MED ORDER — SODIUM CHLORIDE 1 G PO TABS
1.0000 g | ORAL_TABLET | Freq: Two times a day (BID) | ORAL | 0 refills | Status: DC
  Filled 2022-03-28: qty 60, 30d supply, fill #0

## 2022-03-28 MED ORDER — ENOXAPARIN (LOVENOX) PATIENT EDUCATION KIT
1.0000 | PACK | Freq: Once | 0 refills | Status: AC
  Filled 2022-03-28: qty 1, 1d supply, fill #0

## 2022-03-28 MED ORDER — MELATONIN 5 MG PO TABS
10.0000 mg | ORAL_TABLET | Freq: Every day | ORAL | 0 refills | Status: DC
  Filled 2022-03-28: qty 60, 30d supply, fill #0

## 2022-03-28 MED ORDER — LISINOPRIL 5 MG PO TABS
5.0000 mg | ORAL_TABLET | Freq: Every day | ORAL | 0 refills | Status: DC
  Filled 2022-03-28: qty 30, 30d supply, fill #0

## 2022-03-28 MED ORDER — GABAPENTIN 300 MG PO CAPS
300.0000 mg | ORAL_CAPSULE | Freq: Three times a day (TID) | ORAL | 0 refills | Status: DC
  Filled 2022-03-28: qty 90, 30d supply, fill #0

## 2022-03-28 MED ORDER — OXYCODONE HCL 5 MG PO TABS
5.0000 mg | ORAL_TABLET | ORAL | 0 refills | Status: DC | PRN
  Filled 2022-03-28: qty 30, 3d supply, fill #0

## 2022-03-28 MED ORDER — POLYETHYLENE GLYCOL 3350 17 GM/SCOOP PO POWD
17.0000 g | Freq: Every day | ORAL | 1 refills | Status: DC | PRN
  Filled 2022-03-28: qty 238, 14d supply, fill #0

## 2022-03-28 MED ORDER — SENNOSIDES-DOCUSATE SODIUM 8.6-50 MG PO TABS
2.0000 | ORAL_TABLET | Freq: Every day | ORAL | 3 refills | Status: DC
  Filled 2022-03-28: qty 60, 30d supply, fill #0

## 2022-03-28 MED ORDER — ACETAMINOPHEN 325 MG PO TABS: 325.0000 mg | ORAL_TABLET | ORAL | Status: DC | PRN

## 2022-03-28 MED ORDER — DEXAMETHASONE 4 MG PO TABS
4.0000 mg | ORAL_TABLET | Freq: Two times a day (BID) | ORAL | 0 refills | Status: DC
  Filled 2022-03-28: qty 60, 30d supply, fill #0

## 2022-03-28 MED ORDER — AMITRIPTYLINE HCL 100 MG PO TABS
200.0000 mg | ORAL_TABLET | Freq: Every day | ORAL | 0 refills | Status: DC
  Filled 2022-03-28 (×2): qty 60, 30d supply, fill #0

## 2022-03-28 MED ORDER — CYCLOBENZAPRINE HCL 5 MG PO TABS
5.0000 mg | ORAL_TABLET | Freq: Every day | ORAL | 0 refills | Status: DC
  Filled 2022-03-28: qty 60, 30d supply, fill #0

## 2022-03-28 MED ORDER — PANTOPRAZOLE SODIUM 40 MG PO TBEC
40.0000 mg | DELAYED_RELEASE_TABLET | Freq: Two times a day (BID) | ORAL | 0 refills | Status: DC
  Filled 2022-03-28: qty 60, 30d supply, fill #0

## 2022-03-28 NOTE — Progress Notes (Signed)
PROGRESS NOTE Kelly [name]  [id number] DOB: Apr 14, 1965 DOA: 03/13/2022 PCP: [facility]  Brief Narrative/Hospital Course:  57 y.o. 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 Arnoldo Morale, also has underlying history of dyslipidemia, high pretension and hypothyroidism.  She was initially admitted to the hospital for tumor resection subsequently sent to [facility] for rehab, she is currently undergoing radiation treatments following which she is scheduled for chemotherapy treatments.  [name] team was consulted on 03/24/2022 for hypotension and patient was found to have pneumonia at that time > patient was initiated on ceftriaxone and azithromycin.    Subjective: Seen and examined, Overnight vitals/labs/events reviewed  She has no complaints of shortness of breath fever chills cough. She is being released today   Assessment and Plan: Principal Problem:   Thoracic spinal cord injury, sequela ([name]) Active Problems:   Adjustment disorder with depressed mood   Glioblastoma multiforme (HCC)   Sepsis and hypotension due to pneumonia: Overall doing well, saturating well on RA,afebrile  wbc stable.  CT spine 2/5 noted scattered groundglass densities and peribronchovascular groundglass consolidation in both lungs concerning for multifocal pneumonia s/p Ceftriaxone and azithromycin> completing 5 days of antibiotics today-given respiratory status improved, okay to complete antibiotics today and advised outpatient follow-up with PCP. Blood culture 3/4 NGTD, continue pulmonary support, IS, PT OT ambulation.  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 Mickeal Skinner following next continue Decadron, Neurontin, Vimpat, Keppra and Decardon- per primary/Dr Vaslow . Cont pain meds  with oxy, Elavil. Undergoing radiation treatment and is scheduled for outpatient chemotherapy as well.  Continue PT OTas per neurosurgery team, she continues to have left lower extremity weakness along with possible urinary retention and neurogenic bladder  Neurogenic bladder: Continue Flomax. Voiding.Primary prefered I/o cath History of hypothyroidism: TSH  1.1.  cortisol was also checked 3/5 and low 1.5 but she is on steroid ? Significance- will need fu OP. GERD continue PPI Hyperlipidemia continue statin Class I Obesity:Patient's Body mass index is 34.17 kg/m. : Will benefit with PCP follow-up, weight loss  healthy lifestyle and outpatient sleep evaluation.  DVT prophylaxis: Place TED hose Start: 03/19/22 0839 enoxaparin (LOVENOX) injection 40 mg Start: 03/14/22 1200 Code Status:   Code Status: Full Code Family Communication: plan of care discussed with patient at bedside. Patient status is: Inpatient because of pneumonia and malignancy Level of care:    Dispo: The patient is from: HOME            Anticipated disposition: Being discharged home today  Objective: Vitals last 24 hrs: Vitals:   03/27/22 0409 03/27/22 1337 03/27/22 1919 03/28/22 0406  BP: (!) 143/94 136/84 (!) 146/85 135/87  Pulse: 95 99 (!) 102 91  Resp: '18 17 16 16  '$ Temp: 98.1 F (36.7 C) 97.7 F (36.5 C) 98 F (36.7 C) 98.1 F (36.7 C)  TempSrc:  Oral Oral Oral  SpO2: 91% 94% 96% 98%  Weight:      Height:       Weight change:   Physical Examination: General exam: alert awake oriented, HEENT:Oral mucosa moist, Ear/Nose WNL grossly Respiratory system: Bilaterally clear BS, no use of accessory muscle Cardiovascular system: S1 & S2 +, No JVD. Gastrointestinal system: Abdomen soft,NT,ND, BS+ Nervous System: Alert, awake, moving extremities, well w/ weakness on LLE Extremities: LE edema neg,distal peripheral  pulses palpable.  Skin: No rashes,no icterus. MSK: Normal muscle bulk,tone, power   Medications  reviewed:  Scheduled Meds:  amitriptyline  100 mg Oral QHS   atorvastatin  10 mg Oral Daily   calcium carbonate  400 mg of elemental calcium Oral TID   cyclobenzaprine  5-10 mg Oral QHS   dexamethasone  4 mg Oral Q12H   diclofenac Sodium  2 g Topical QID   enoxaparin (LOVENOX) injection  40 mg Subcutaneous Q24H   enoxaparin   Does not apply Once   escitalopram  10 mg Oral QHS   gabapentin  300 mg Oral TID   lacosamide  100 mg Oral BID   levETIRAcetam  1,500 mg Oral BID   melatonin  10 mg Oral QHS   pantoprazole  40 mg Oral BID   senna-docusate  2 tablet Oral QHS   sodium chloride  1 g Oral BID WC   tamsulosin  0.4 mg Oral QPC supper   Continuous Infusions:  sodium chloride Stopped (03/27/22 1745)   azithromycin 500 mg (03/27/22 1632)   cefTRIAXone (ROCEPHIN)  IV 2 g (03/27/22 1102)      Diet Order             Diet regular Room service appropriate? Yes; Fluid consistency: Thin  Diet effective now                  Intake/Output Summary (Last 24 hours) at 03/28/2022 0737 Last data filed at 03/28/2022 0406 Gross per 24 hour  Intake 833.78 ml  Output 1800 ml  Net -966.22 ml    Net IO Since Admission: -6,040.22 mL [03/28/22 0737]  Wt Readings from Last 3 Encounters:  03/13/22 90.3 kg  03/08/22 90.7 kg  03/03/22 90.2 kg     Unresulted Labs (From admission, onward)     Start     Ordered   03/17/22 [id number]  Basic metabolic panel  Weekly,   R      03/13/22 1722   03/17/22 0500  CBC  Weekly,   R      03/13/22 1722          Data Reviewed: I have personally reviewed following labs and imaging studies CBC: Recent Labs  Lab 03/24/22 0622 03/25/22 1032  WBC 9.3 9.9  HGB 16.5* 13.4  HCT 47.7* 38.3  MCV 102.6* 101.9*  PLT 217 0000000    Basic Metabolic Panel: Recent Labs  Lab 03/24/22 0622 03/25/22 1032  NA 132* 134*  K 4.3 4.1  CL 102 102  CO2 23 24  GLUCOSE 126* 99  BUN 20 18  CREATININE 0.71 0.74  CALCIUM 8.1* 8.3*  MG  --  2.1    GFR: Estimated  Creatinine Clearance: 84.4 mL/min (by C-G formula based on SCr of 0.74 mg/dL). Liver Function Tests: Recent Labs  Lab 03/25/22 1032  AST 15  ALT 29  ALKPHOS 71  BILITOT 0.7  PROT 5.5*  ALBUMIN 2.2*   CBG: Recent Labs  Lab 03/24/22 1010  GLUCAP 126*    Recent Labs    03/25/22 1032  TSH 1.145    Sepsis Labs: Recent Labs  Lab 03/24/22 1108 03/24/22 1113 03/24/22 1332 03/25/22 1032  PROCALCITON 5.89  --   --  6.45  LATICACIDVEN  --  1.8 2.0*  --      Recent Results (from the past 240 hour(s))  Culture, blood (Routine X 2) w Reflex to ID Panel     Status: None (Preliminary result)  Collection Time: 03/24/22  1:32 PM   Specimen: BLOOD LEFT HAND  Result Value Ref Range Status   Specimen Description BLOOD LEFT HAND  Final   Special Requests   Final    BOTTLES DRAWN AEROBIC AND ANAEROBIC Blood Culture adequate volume   Culture   Final    NO GROWTH 4 DAYS Performed at [facility], [address], [place], [place] [address]    Report Status PENDING  Incomplete  Culture, blood (Routine X 2) w Reflex to ID Panel     Status: None (Preliminary result)   Collection Time: 03/24/22  1:40 PM   Specimen: BLOOD LEFT WRIST  Result Value Ref Range Status   Specimen Description BLOOD LEFT WRIST  Final   Special Requests   Final    BOTTLES DRAWN AEROBIC ONLY Blood Culture adequate volume   Culture   Final    NO GROWTH 4 DAYS Performed at [facility], [place] [address] [place], [place] [address]    Report Status PENDING  Incomplete  MRSA Next Gen by PCR, Nasal     Status: None   Collection Time: 03/25/22  6:51 AM   Specimen: Nasal Mucosa; Nasal Swab  Result Value Ref Range Status   MRSA by PCR Next Gen NOT DETECTED NOT DETECTED Final    Comment: (NOTE) The GeneXpert MRSA Assay (FDA approved for NASAL specimens only), is one component of a comprehensive MRSA colonization surveillance program. It is not intended to diagnose MRSA infection nor to guide or  monitor treatment for MRSA infections. Test performance is not FDA approved in patients less than 70 years old. Performed at [facility], [place] [address], [place], [place] [address]     Antimicrobials: Anti-infectives (From admission, onward)    Start     Dose/Rate Route Frequency Ordered Stop   03/24/22 1600  cefTRIAXone (ROCEPHIN) 2 g in sodium chloride 0.9 % 100 mL IVPB        2 g 200 mL/hr over 30 Minutes Intravenous Every 24 hours 03/24/22 1457     03/24/22 1600  azithromycin (ZITHROMAX) 500 mg in sodium chloride 0.9 % 250 mL IVPB        500 mg 250 mL/hr over 60 Minutes Intravenous Every 24 hours 03/24/22 1457        Culture/Microbiology    Component Value Date/Time   SDES BLOOD LEFT WRIST 03/24/2022 1340   SPECREQUEST  03/24/2022 1340    BOTTLES DRAWN AEROBIC ONLY Blood Culture adequate volume   CULT  03/24/2022 1340    NO GROWTH 4 DAYS Performed at [facility], [place] [address] [place], [place] [address]    REPTSTATUS PENDING 03/24/2022 1340  Radiology Studies: No results found.   LOS: 15 days   Antonieta Pert, MD [facility]  03/28/2022, 7:37 AM

## 2022-03-28 NOTE — Progress Notes (Signed)
Closed out nursing care plan and nursing eduction for discharge to home. Lovenox education completed. Will send home several straight catheters just in case they are needed.

## 2022-03-28 NOTE — Progress Notes (Signed)
Inpatient Rehabilitation Care Coordinator Discharge Note   Patient Details  Name: Kelly [name] MRN: [id number] Date of Birth: 1965-06-04   Discharge location: D/c to home  Length of Stay: 14 days  Discharge activity level: Min Asst  Home/community participation: Limited  Patient response [id number] Literacy - How often do you need to have someone help you when you read instructions, pamphlets, or other written material from your doctor or pharmacy?: Never  Patient response [id number] Isolation - How often do you feel lonely or isolated from those around you?: Never  Services provided included: MD, RD, PT, OT, RN, CM, TR, Pharmacy, Neuropsych, SW  Financial Services:  Charity fundraiser Utilized: Multimedia programmer [place] Medicaid [employer] offered to/list presented to: pt  Follow-up services arranged:  Patient/Family has no preference for HH/DME agencies, Home Health, DME Home Health Agency: [facility] for HHPT/OT/aide    DME : [facility] for hospital bed, slide board, TTB, and DABSC. [employer] for incontinence supplies and catheters. [facility] for specialty w/c.    Patient response to transportation need: Is the patient able to respond to transportation needs?: Yes In the past 12 months, has lack of transportation kept you from medical appointments or from getting medications?: No In the past 12 months, has lack of transportation kept you from meetings, work, or from getting things needed for daily living?: No    Comments (or additional information):  Patient/Family verbalized understanding of follow-up arrangements:  Yes  Individual responsible for coordination of the follow-up plan: contact pt Shreeya [phone]  Confirmed correct DME delivered: Rana Snare 03/28/2022    Rana Snare

## 2022-03-28 NOTE — Progress Notes (Signed)
PROGRESS NOTE   Subjective/Complaints:  Pt asking about increasing Elavil dose to 200 mg QHS Also said back hurting-   Went over that PA went over meds with her, but we discussed some of changes.    ROS:   Pt denies SOB, abd pain, CP, N/V/C/D, and vision changes  Except for HPI  Objective:   No results found. Recent Labs    03/25/22 1032  WBC 9.9  HGB 13.4  HCT 38.3  PLT 160    Recent Labs    03/25/22 1032  NA 134*  K 4.1  CL 102  CO2 24  GLUCOSE 99  BUN 18  CREATININE 0.74  CALCIUM 8.3*     Intake/Output Summary (Last 24 hours) at 03/28/2022 0844 Last data filed at 03/28/2022 0758 Gross per 24 hour  Intake 951.78 ml  Output 2200 ml  Net -1248.22 ml        Physical Exam: Vital Signs Blood pressure 135/87, pulse 91, temperature 98.1 F (36.7 C), temperature source Oral, resp. rate 16, height '5\' 4"'$  (1.626 m), weight 90.3 kg, SpO2 98 %.       General: awake, alert, appropriate, sitting up some in bed; NAD HENT: conjugate gaze; oropharynx moist CV: regular rate and rhythm; no JVD Pulmonary: CTA B/L; no W/R/R- good air movement GI: soft, NT, ND, (+)BS- normoactive Psychiatric: appropriate Neurological: Ox3  - decreased to light touch on R foot still noted- Can now do DF 2-/5 and PF 2-/5 on L foot, noted again today RLE- HF 3-/5; KE/KF 3/5; and DF 4-/5 and PF 4/5  Prior exam Strength:                RUE: 5/5 SA, 5/5 EF, 5/5 EE, 5/5 WE, 5/5 FF, 5/5 FA                 LUE: 5/5 SA, 5/5 EF, 5/5 EE, 5/5 WE, 5/5 FF, 5/5 FA                 RLE: 5/5 HF, 5/5 KE, 5/5 DF, 5/5 EHL, 5/5 PF                 LLE:  2/5 HF, 1+/5 KE, 0/5 DF, 3/5 EHL, 1/5 PF  Neurologic exam: Strength able to lift bilateral upper extremity and right lower extremity to gravity, unable to lift left lower extremity to gravity, alert and awake, no dysarthria, patchy sensory loss approximately T5 and below on the left.,  Cranial  nerves II through XII grossly intact, no abnormal tone   Assessment/Plan: 1. Functional deficits which require 3+ hours per day of interdisciplinary therapy in a comprehensive inpatient rehab setting. Physiatrist is providing close team supervision and 24 hour management of active medical problems listed below. Physiatrist and rehab team continue to assess barriers to discharge/monitor patient progress toward functional and medical goals  Care Tool:  Bathing    Body parts bathed by patient: Left upper leg, Right arm, Left arm, Right lower leg, Chest, Abdomen, Left lower leg, Front perineal area, Face, Buttocks, Right upper leg         Bathing assist Assist Level: Minimal Assistance - Patient >  75%     Upper Body Dressing/Undressing Upper body dressing   What is the patient wearing?: Pull over shirt    Upper body assist Assist Level: Independent with assistive device    Lower Body Dressing/Undressing Lower body dressing      What is the patient wearing?: Underwear/pull up, Pants     Lower body assist Assist for lower body dressing: Minimal Assistance - Patient > 75%     Toileting Toileting    Toileting assist Assist for toileting: Minimal Assistance - Patient > 75%     Transfers Chair/bed transfer  Transfers assist     Chair/bed transfer assist level: Minimal Assistance - Patient > 75%     Locomotion Ambulation   Ambulation assist   Ambulation activity did not occur: Safety/medical concerns          Walk 10 feet activity   Assist  Walk 10 feet activity did not occur: Safety/medical concerns        Walk 50 feet activity   Assist Walk 50 feet with 2 turns activity did not occur: Safety/medical concerns         Walk 150 feet activity   Assist Walk 150 feet activity did not occur: Safety/medical concerns         Walk 10 feet on uneven surface  activity   Assist Walk 10 feet on uneven surfaces activity did not occur:  Safety/medical concerns         Wheelchair     Assist Is the patient using a wheelchair?: Yes Type of Wheelchair: Manual    Wheelchair assist level: Supervision/Verbal cueing Max wheelchair distance: 300    Wheelchair 50 feet with 2 turns activity    Assist        Assist Level: Supervision/Verbal cueing   Wheelchair 150 feet activity     Assist      Assist Level: Supervision/Verbal cueing   Blood pressure 135/87, pulse 91, temperature 98.1 F (36.7 C), temperature source Oral, resp. rate 16, height '5\' 4"'$  (1.626 m), weight 90.3 kg, SpO2 98 %.  Medical Problem List and Plan: 1. 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  Antithrombotics: -DVT/anticoagulation:  Pharmaceutical: Lovenox '40mg'$  QD 3/7- will send home on lovenox- will need for 1 more month- needs lovenox teaching             -antiplatelet therapy: none   3. Pain Management:  -continue oxycodone 10 mg 5 x daily -continue Flexeril 5-10 mg q HS -continue Voltaren gel 2g QID, Advil '400mg'$  q6h PRN and Tylenol as needed, Robaxin '500mg'$  q6h PRN -Consider adjusting oxycodone to as needed   -2/28  will add gabapentin 300 mg QHS and increase again on Friday to TID  -2/29- tolerating well- no side effects  3/4- will increase  oxycodone to 15 mg 5x/day- said also taking ibuprofen as needed  3/5- changed pain meds to prn and will take away ibuprofen  due to chest pain  3/7- Will restarted on Oxy scheduled- will change to 5-10 mg q 4 hours PRN- and add Kpad 4. Mood/Behavior/Sleep: LCSW to evaluate and provide emotional support             -antipsychotic agents: n/a             -continue Lexapro '10mg'$  QHS and Elavil '100mg'$  QHS  -Trazodone 25-'50mg'$  QHS PRN  -03/15/22 start '6mg'$  melatonin QHS-helping 03/16/22, continue   -2/27- still waking up ~ 4am- but doesn't want to change meds yet  -2/29- waking up early, but no med changes per pt.   -03/23/22 still moderately poor sleep but doesn't want med changes  3/8- wants to go back to Elavil 200 mg QHS which took at home 5. Neuropsych/cognition: This patient is capable of making decisions on her own behalf.   6. Skin/Wound Care: Routine skin care checks   7. Fluids/Electrolytes/Nutrition: Routine Is and Os and follow-up chemistries, next 03/17/22  -03/15/22 Na 134 noted 03/14/22, monitor on following labs next week -2/26 Bun up to 24, pt plans to have yeti cup brought over by family to increase fluid intake, recheck Wednesday for azotemia   -2/28- BUN down to 17 from 24- will con't to monitor weekly. Next 03/24/22 8: Hypertension: monitor TID and prn             -continue lisinopril 10 mg daily  3/5- BP much better than yesterday when was 60/40s- is up to 105/67- on IVFs still- thank you internal medicine  3/6- d/c;d IVFs- BP running 120s/70s-80s- con't off Lisinopril  3/8- BP usually controlled- off Lisinopril due to orthostatic hypotension from sepsis earlier in week-  wil restart baby dose of Lisinopril Vitals:   03/25/22 0430 03/25/22 0500 03/25/22 0530 03/25/22 1552  BP: 105/67 101/67 101/64 (!) 139/117   03/25/22 2205 03/26/22 0430 03/26/22 1442 03/26/22 1932  BP: 127/83 120/75 128/84 (!) 137/95   03/27/22 0409 03/27/22 1337 03/27/22 1919 03/28/22 0406  BP: (!) 143/94 136/84 (!) 146/85 135/87       9: Glioblastoma s/p resection, 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:             -continue Decadron 6 mg q 6  hours -continue Keppra 1500 mg BID and Vimpat 50 mg BID for seizure ppx             - XRT x10 followed by oral chemotherapy   -2/27- to start Radiation today at 2:30 per chart  -2/28 no change to Decadron dosing  -3/1- XRT going well per pt.  10: Obesity, Class III- BMI 34             - Complicates medical decision making; nutrition onboard   11: GI prophylaxis: Protonix '40mg'$  BID   3/4- having a lot of reflux Sx's but on Protonix BID 40 mg- and allergic to Zantac- anaphylaxis- no other choices- but will ask pt to avoid ibuprofen if at all possible. .  12: Constipation vs neurogenic bowel: Given smog enema yesterday             -Senokot 2tabs QHS             -Miralax 17g BID  -PRNs: MoM, Fleet's, Sorbitol 23m -2/23 had a large bowel movement yesterday, had bowel movements the day prior also, continue to monitor -3/1- will make Miralax  prn since having a lot of bowel incontinence- due to loose stools coupled with lack of sensation.  -03/22/22 LBM this morning, no incontinence documented but pt states lack of sensation still present; monitor 3/4- Having regular BM's- LBM last night  3/8- LBM yesterday- going regularly 13. Urinary urgency vs. Neurogenic bladder. Continent.              - UA 2/22 negative except for small HgB -2/29- going well- no inconitnence and retaining small amounts, but not enough to need cathing  3/5- asking pt to be bladder scanned- pending results.  14.  Leukocytosis- new pneumonia -Likely due to Decadron/hemoconcentration, no signs of infection -03/17/22 WNL WBC, monitor weekly, next 03/24/22  3/4- WBC 9.3k  3/5- has a new pneumonia based on CXR- L side- on Zithromax and Rocephin IV- spoke with lab to get labs drawn at 10am- had tried, but was in therapy and bathroom- U/A checked- trace leuks, but otherwise completely negative  3/7- will finish Rocephin tomorrow- but will move earlier so can leave by noon; also will finish Zithromax IV today per pharmacy-   3/8- per IM  agree with finishing Rocephin today- not symptomatic anymore 15. Hyponatremia   -Start salt tabs BID, recheck  Wednesday -2/28- Na 134- doing better- con't regimen for now, monitor weekly next on 03/24/22  3/4- Na 132- overall stable  3/7- last Na 134 16. Sepsis 3/4  3/5- had hypotension  down to 60s/40s and lactic acidosis- is s/p 2 L fast IVFs and now on 100cc/hour- labs pending to see how things going. Wasn't transferred- did stabilize- but monitoring closely. Of note, pt eats lying down with hiatal hernia-  17. RLE weakness  3/5- will check CT of cervical, thoracic and lumbar spine since doing worse-   3/6- nothing stands out as cause, but is still the biggest issue/barrier as well as LLE weakness 18. Urinary retention  3/7- is retaining urine in 200-s300-s- rarely needing caths HOWEVER puts her at high risk for UTI- will send home with cath supplies to cath up to 2x/day as needed for not emptying- did start Flomax to help this  0.4 mg q supper    Patient needs catheters 2x/day as needed for urinary retention as above; also needs incontinence supplies since having Bowel and bladder incontinence while here due ot neurogenic bowel and bladder- .   I spent a total of 34   minutes on total care today- >50% coordination of care- due to  D/w pt about Elavil, incontinent supplies, d/c meds; Cath supplies- daughter is nurse and can cath her.    NEEDS RX for Miralax when goes home.  Also decadron needs to be 4 mg daily at d/c.    LOS: 15 days A FACE TO FACE EVALUATION WAS PERFORMED  Clearence Vitug 03/28/2022, 8:44 AM

## 2022-03-28 NOTE — Progress Notes (Addendum)
Patient ID: Kelly [name], female   DOB: [birth year], 57 y.o.   MRN: [id number]  SW spoke with Dorothy/[facility] ([id number] ID# [id number]) to arrange ambulance transport for 11:30am pick up. Reports SW will need to call back to see the assigned provider.   1053-SW called [facility] to get insight on provider assigned. Assigned to [facility].   *SW later met with pt, pt husband, and pt dtr to review above.   Ambulance company was [employer].   Loralee Pacas, MSW, [place] Office: [phone] Cell: [phone] Fax: [phone]

## 2022-03-29 LAB — CULTURE, BLOOD (ROUTINE X 2)
Culture: NO GROWTH
Culture: NO GROWTH
Special Requests: ADEQUATE
Special Requests: ADEQUATE

## 2022-03-31 ENCOUNTER — Telehealth: Payer: Self-pay

## 2022-03-31 ENCOUNTER — Encounter: Payer: Self-pay | Admitting: Radiation Oncology

## 2022-03-31 ENCOUNTER — Other Ambulatory Visit: Payer: Self-pay | Admitting: Internal Medicine

## 2022-03-31 ENCOUNTER — Telehealth: Payer: Self-pay | Admitting: *Deleted

## 2022-03-31 DIAGNOSIS — G96198 Other disorders of meninges, not elsewhere classified: Secondary | ICD-10-CM

## 2022-03-31 DIAGNOSIS — R32 Unspecified urinary incontinence: Secondary | ICD-10-CM

## 2022-03-31 DIAGNOSIS — C719 Malignant neoplasm of brain, unspecified: Secondary | ICD-10-CM

## 2022-03-31 NOTE — Progress Notes (Signed)
                                                                                                                                                             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  Intent: Palliative  Radiation Treatment Dates: 03/12/2022 through 03/26/2022 Site Technique Total Dose (Gy) Dose per Fx (Gy) Completed Fx Beam Energies  Thoracic Spine: Spine 3D 30/30 3 10/10 10X, 15X   Narrative: The patient tolerated radiation therapy relatively well. She did not have improvement in the LLE strength, but no weakness was noted in the RLL though she had numbness at the conclusion of her treatment.   Plan: The patient will receive a call in about one month from the radiation oncology department. She will continue follow up with Dr. Mickeal Skinner in the brain oncology program as well.   ________________________________________________    Carola Rhine, PAC

## 2022-03-31 NOTE — Telephone Encounter (Signed)
Patient called to advise that she is home now and plans to start her Temodar this evening.  She reports issue with urinating in the bed urinal as she has extreme difficulty getting up and out of bed.  Denies any urinary retention but more of mobility issues.  States she was discharged home with supplies for in and out catheters but wanted to know if he would be ok with her putting in a catheter with a leg bag instead of just doing in and outs as it would help her care givers.  Advised that I would ask but this might be a better question for her PCP or urologist since it poses risks for infection.  She stated understanding and just wanted to run it by the doctor.   She also wanted to give feedback that the Oxycodone 5-10 mg is not effective for her back pain.  She states that at the last visit Dr Mickeal Skinner told her to report if it was not effective.    Routing to Dr Mickeal Skinner to please advise.

## 2022-03-31 NOTE — Telephone Encounter (Signed)
She can have , but need to send to [facility]- originally, she wasn't retaining so much she needed a foley, so please find out why- but send to [facility]- will need to have daughter, who is nurse ot put in- thanks- ML

## 2022-03-31 NOTE — Telephone Encounter (Signed)
Patient is requesting an order for Foley catheter w/ leg band

## 2022-03-31 NOTE — Addendum Note (Signed)
Addended by: Ventura Sellers on: 03/31/2022 03:55 PM   Modules accepted: Orders

## 2022-04-01 ENCOUNTER — Encounter: Payer: Self-pay | Admitting: Internal Medicine

## 2022-04-01 ENCOUNTER — Other Ambulatory Visit: Payer: Self-pay | Admitting: Radiation Therapy

## 2022-04-01 NOTE — Telephone Encounter (Signed)
Communicated response to patient.  She states understanding and will follow up with Inpatient rehab provider if they are able to assist with catheter needs.  She is scheduled to see our palliative team for pain management needs.

## 2022-04-04 ENCOUNTER — Telehealth: Payer: Self-pay | Admitting: *Deleted

## 2022-04-04 ENCOUNTER — Other Ambulatory Visit: Payer: Self-pay | Admitting: Internal Medicine

## 2022-04-04 NOTE — Telephone Encounter (Signed)
Merrie Roof calling from [facility] for verbal OT [id number].  Verbal given

## 2022-04-07 ENCOUNTER — Other Ambulatory Visit (HOSPITAL_COMMUNITY): Payer: Self-pay

## 2022-04-07 NOTE — Progress Notes (Signed)
Patient called oncall Nurse over the weekend for an earache. This LPN called patient to follow up about earache. Patient states she did not get checked out but she feels better now. No further questions or concerns to note.

## 2022-04-07 NOTE — Progress Notes (Signed)
Patient left voicemail stating her family feels like she is more confused but she feels fine. This LPN called patient back and asked about the confusion. Patient states she doesn't feel more confused. Also states she knows confusion at times is to be expected. This LPN told patient that Dr. Mickeal Skinner was made aware and there are no recommendations at this time. Patient verbalized understanding.

## 2022-04-08 ENCOUNTER — Inpatient Hospital Stay (HOSPITAL_BASED_OUTPATIENT_CLINIC_OR_DEPARTMENT_OTHER): Payer: Medicaid Other | Admitting: Internal Medicine

## 2022-04-08 ENCOUNTER — Telehealth: Payer: Self-pay | Admitting: Internal Medicine

## 2022-04-08 DIAGNOSIS — G96198 Other disorders of meninges, not elsewhere classified: Secondary | ICD-10-CM | POA: Diagnosis not present

## 2022-04-08 DIAGNOSIS — N3941 Urge incontinence: Secondary | ICD-10-CM

## 2022-04-08 DIAGNOSIS — C719 Malignant neoplasm of brain, unspecified: Secondary | ICD-10-CM | POA: Diagnosis not present

## 2022-04-08 MED ORDER — DEXAMETHASONE 1 MG PO TABS: ORAL_TABLET | ORAL | 0 refills | Status: AC

## 2022-04-08 MED ORDER — OXYCODONE HCL 5 MG PO TABS: 5.0000 mg | ORAL_TABLET | ORAL | 0 refills | Status: DC | PRN

## 2022-04-08 NOTE — Telephone Encounter (Signed)
Per 3/19 IB reached out to patient to schedule, patient aware of time and date or appointment.

## 2022-04-08 NOTE — Progress Notes (Signed)
I connected with Kelly [name] on 04/08/22 at 10:30 AM EDT by telephone visit and verified that I am speaking with the correct person using two identifiers.   I discussed the limitations, risks, security and privacy concerns of performing an evaluation and management service by telemedicine and the availability of in-person appointments. I also discussed with the patient that there may be a patient responsible charge related to this service. The patient expressed understanding and agreed to proceed.   Other persons participating in the visit and their role in the encounter:  n/a   Patient's location:  Home Provider's location:  Office Chief Complaint:  Urge incontinence of urine - Plan: Ambulatory referral to Urology  History of Present Ilness: Kelly [name] describes ongoing severe midline back pain.  She has increased dosing of oxycodone to 10mg  every 4 hours, from 5mg  prior.  Incontinence has persisted, with ongoing need to use catheters.  Did have some nausea with the chemo this month, overall manageable.      Observations: Language and cognition at baseline  Assessment and Plan: Urge incontinence of urine - Plan: Ambulatory referral to Urology  Will refer to urology for management of incontinence, she would like to review other options aside from the straight cath.  Appreciate help from palliative care for pain regimen titration, at this time she may increase or continue the oxycodone 10mg  q4 prn.   Follow Up Instructions:   Will follow up in 3 weeks following MRI brain and thoracic spine, as discussed.  I discussed the assessment and treatment plan with the patient.  The patient was provided an opportunity to ask questions and all were answered.  The patient agreed with the plan and demonstrated understanding of the instructions.    The patient was advised to call back or seek an in-person evaluation if the symptoms worsen or if the condition fails to improve as anticipated.     Ventura Sellers, MD   I provided 25 minutes of non face-to-face telephone visit time during this encounter, and > 50% was spent counseling as documented under my assessment & plan.

## 2022-04-10 ENCOUNTER — Telehealth: Payer: Self-pay

## 2022-04-10 NOTE — Telephone Encounter (Signed)
I will also place a consult for urology for foley question  [place]  Referral faxed to [facility] and confirmation received.  HB

## 2022-04-10 NOTE — Telephone Encounter (Signed)
Call received from [facility] requesting signed orders for pt's catheter supplies.  Dr Mickeal Skinner has agreed to sign orders until she is established with a Urologist.  Orders faxed and confirmation received

## 2022-04-11 ENCOUNTER — Telehealth: Payer: Self-pay

## 2022-04-11 NOTE — Telephone Encounter (Signed)
Spoke with pt and advised her of Dr Renda Rolls recommendation to go to the ED.  Pt stated she did not want to go. Pt was encouraged to go to be evaluated for her multiple symptoms.

## 2022-04-11 NOTE — Telephone Encounter (Signed)
Message received from pt's daughter,Ashley-not on call list- stating her mother has had a significant decline in her health in the last 72 hours.  Her pain is uncontrolled with pain meds, urine is amber almost brown, trembling in her left hand,increased fatigue and confusion.  She is sleeping and not getting out of bed.  Has not been able to participate in her PT.  Please advise. *LM for husband and sister to return my call
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
## 2022-04-14 ENCOUNTER — Other Ambulatory Visit: Payer: Self-pay | Admitting: Internal Medicine

## 2022-04-14 ENCOUNTER — Telehealth: Payer: Self-pay | Admitting: Nurse Practitioner

## 2022-04-14 DIAGNOSIS — C719 Malignant neoplasm of brain, unspecified: Secondary | ICD-10-CM

## 2022-04-14 NOTE — Telephone Encounter (Signed)
Patient will have labs and md visit prior to refilling Rx.

## 2022-04-14 NOTE — Telephone Encounter (Signed)
Per 3/22 Ib reached out to patient to answer questions, voicemail full will try again at a later time.

## 2022-04-16 ENCOUNTER — Inpatient Hospital Stay: Payer: Medicaid Other | Admitting: Nurse Practitioner

## 2022-04-17 ENCOUNTER — Telehealth: Payer: Self-pay | Admitting: *Deleted

## 2022-04-17 NOTE — Telephone Encounter (Signed)
Burke Keels, PT with [facility] left VM at this office on 04/15/22 stating she had faxed request for DME for this patient on 04/01/22 & hasn't had a response.  She states patient needs a trapeze bar, half bed rail, gel WC cushion and power WC or high back reclining WC.  Returned call to [facility] on 04/15/22, 04/16/22 & 04/17/22 - left a VM each time.  Clarification is needed for equipment.  Instructed Lana to refax request to this office directly [phone] or call [phone].

## 2022-04-18 ENCOUNTER — Emergency Department (HOSPITAL_COMMUNITY): Payer: Medicaid Other

## 2022-04-18 ENCOUNTER — Observation Stay (HOSPITAL_COMMUNITY)
Admission: EM | Admit: 2022-04-18 | Discharge: 2022-04-19 | Disposition: A | Discharge status: Home / Self Care | Payer: Medicaid Other | Attending: Internal Medicine | Admitting: Internal Medicine

## 2022-04-18 ENCOUNTER — Encounter (HOSPITAL_COMMUNITY): Payer: Self-pay

## 2022-04-18 ENCOUNTER — Other Ambulatory Visit: Payer: Self-pay

## 2022-04-18 ENCOUNTER — Emergency Department (HOSPITAL_BASED_OUTPATIENT_CLINIC_OR_DEPARTMENT_OTHER): Payer: Medicaid Other

## 2022-04-18 DIAGNOSIS — M79609 Pain in unspecified limb: Secondary | ICD-10-CM | POA: Diagnosis not present

## 2022-04-18 DIAGNOSIS — D649 Anemia, unspecified: Secondary | ICD-10-CM | POA: Insufficient documentation

## 2022-04-18 DIAGNOSIS — F4321 Adjustment disorder with depressed mood: Secondary | ICD-10-CM | POA: Diagnosis present

## 2022-04-18 DIAGNOSIS — E039 Hypothyroidism, unspecified: Secondary | ICD-10-CM | POA: Diagnosis present

## 2022-04-18 DIAGNOSIS — Z79899 Other long term (current) drug therapy: Secondary | ICD-10-CM | POA: Insufficient documentation

## 2022-04-18 DIAGNOSIS — G9340 Encephalopathy, unspecified: Principal | ICD-10-CM | POA: Insufficient documentation

## 2022-04-18 DIAGNOSIS — D696 Thrombocytopenia, unspecified: Secondary | ICD-10-CM

## 2022-04-18 DIAGNOSIS — F17210 Nicotine dependence, cigarettes, uncomplicated: Secondary | ICD-10-CM | POA: Diagnosis not present

## 2022-04-18 DIAGNOSIS — M79662 Pain in left lower leg: Secondary | ICD-10-CM

## 2022-04-18 DIAGNOSIS — E785 Hyperlipidemia, unspecified: Secondary | ICD-10-CM | POA: Diagnosis present

## 2022-04-18 DIAGNOSIS — I10 Essential (primary) hypertension: Secondary | ICD-10-CM | POA: Diagnosis present

## 2022-04-18 DIAGNOSIS — R410 Disorientation, unspecified: Secondary | ICD-10-CM | POA: Diagnosis present

## 2022-04-18 DIAGNOSIS — C719 Malignant neoplasm of brain, unspecified: Secondary | ICD-10-CM | POA: Diagnosis present

## 2022-04-18 DIAGNOSIS — R4182 Altered mental status, unspecified: Secondary | ICD-10-CM

## 2022-04-18 LAB — URINALYSIS, ROUTINE W REFLEX MICROSCOPIC
Bacteria, UA: NONE SEEN
Bilirubin Urine: NEGATIVE
Glucose, UA: NEGATIVE mg/dL
Ketones, ur: NEGATIVE mg/dL
Leukocytes,Ua: NEGATIVE
Nitrite: NEGATIVE
Protein, ur: NEGATIVE mg/dL
Specific Gravity, Urine: 1.008 (ref 1.005–1.030)
pH: 5 (ref 5.0–8.0)

## 2022-04-18 LAB — COMPREHENSIVE METABOLIC PANEL
ALT: 31 U/L (ref 0–44)
AST: 14 U/L — ABNORMAL LOW (ref 15–41)
Albumin: 2.9 g/dL — ABNORMAL LOW (ref 3.5–5.0)
Alkaline Phosphatase: 84 U/L (ref 38–126)
Anion gap: 10 (ref 5–15)
BUN: 18 mg/dL (ref 6–20)
CO2: 24 mmol/L (ref 22–32)
Calcium: 8.5 mg/dL — ABNORMAL LOW (ref 8.9–10.3)
Chloride: 101 mmol/L (ref 98–111)
Creatinine, Ser: 0.41 mg/dL — ABNORMAL LOW (ref 0.44–1.00)
GFR, Estimated: >60 mL/min (ref >60)
Glucose, Bld: 123 mg/dL — ABNORMAL HIGH (ref 70–99)
Potassium: 3.7 mmol/L (ref 3.5–5.1)
Sodium: 135 mmol/L (ref 135–145)
Total Bilirubin: 0.8 mg/dL (ref 0.3–1.2)
Total Protein: 6.2 g/dL — ABNORMAL LOW (ref 6.5–8.1)

## 2022-04-18 LAB — CBC
HCT: 29.5 % — ABNORMAL LOW (ref 36.0–46.0)
Hemoglobin: 10.5 g/dL — ABNORMAL LOW (ref 12.0–15.0)
MCH: 37.5 pg — ABNORMAL HIGH (ref 26.0–34.0)
MCHC: 35.6 g/dL (ref 30.0–36.0)
MCV: 105.4 fL — ABNORMAL HIGH (ref 80.0–100.0)
Platelets: 101 10*3/uL — ABNORMAL LOW (ref 150–400)
RBC: 2.8 MIL/uL — ABNORMAL LOW (ref 3.87–5.11)
RDW: 14.7 % (ref 11.5–15.5)
WBC: 5.1 10*3/uL (ref 4.0–10.5)
nRBC: 0 % (ref 0.0–0.2)

## 2022-04-18 LAB — AMMONIA: Ammonia: 17 umol/L (ref 9–35)

## 2022-04-18 LAB — CBG MONITORING, ED: Glucose-Capillary: 122 mg/dL — ABNORMAL HIGH (ref 70–99)

## 2022-04-18 MED ORDER — SODIUM CHLORIDE 0.9 % IV SOLN: INTRAVENOUS | Status: DC

## 2022-04-18 MED ORDER — ACETAMINOPHEN 325 MG PO TABS: 650.0000 mg | ORAL_TABLET | Freq: Four times a day (QID) | ORAL | Status: DC | PRN

## 2022-04-18 MED ORDER — ACETAMINOPHEN 325 MG PO TABS
650.0000 mg | ORAL_TABLET | Freq: Once | ORAL | Status: AC
  Administered 2022-04-18: 650 mg via ORAL
  Filled 2022-04-18: qty 2

## 2022-04-18 MED ORDER — IBUPROFEN 200 MG PO TABS
400.0000 mg | ORAL_TABLET | Freq: Once | ORAL | Status: AC | PRN
  Administered 2022-04-18: 400 mg via ORAL
  Filled 2022-04-18: qty 2

## 2022-04-18 MED ORDER — ACETAMINOPHEN 650 MG RE SUPP: 650.0000 mg | Freq: Four times a day (QID) | RECTAL | Status: DC | PRN

## 2022-04-18 MED ORDER — GADOBUTROL 1 MMOL/ML IV SOLN
9.0000 mL | Freq: Once | INTRAVENOUS | Status: AC | PRN
  Administered 2022-04-18: 9 mL via INTRAVENOUS

## 2022-04-18 MED ORDER — SODIUM CHLORIDE 0.9 % IV BOLUS
500.0000 mL | Freq: Once | INTRAVENOUS | Status: AC
  Administered 2022-04-18: 500 mL via INTRAVENOUS

## 2022-04-18 NOTE — ED Notes (Signed)
Pt requesting food and refreshment. [place], PA notified to see if pt is able to eat and drink

## 2022-04-18 NOTE — H&P (Signed)
History and Physical    Kelly [name] [id number] DOB: December 11, 1965 DOA: 04/18/2022  PCP: [facility]  Patient coming from: Home  Chief Complaint: Confusion  HPI: Kelly [name] is a 57 y.o. 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  She was discharged to CIR and also treated for left-sided pneumonia.  Patient presents to the ED today for evaluation of increased confusion over the last week.  She is currently being treated for UTI.  In the ED, patient tachycardic with heart rate 100-110 but remainder of vital signs stable.  Labs showing no leukocytosis, hemoglobin 10.5 (was 13.4 on 03/25/2022), MCV 105.4, platelet count 101k, UA not suggestive of infection.  Chest x-ray not suggestive of pneumonia.  MRI brain showing stable anterior right temporal lobe lesion, no new site of abnormal contrast-enhancement, and progression of right hemispheric white matter hyperintense T2 weighted signal which may be radiation treatment related. Left lower extremity Doppler negative for DVT. Patient received Tylenol and 500 cc normal saline bolus.  TRH called to admit.  History provided by the patient and her mother at bedside.  Mother is concerned that the patient has been increasingly more confused over the past 1 week.  She was diagnosed with a UTI and treated with an antibiotic for a week.  She has an indwelling Foley catheter.  She is currently on chemotherapy, steroids, and taking oxycodone for pain.  She reports ongoing intermittent headaches since the time of her brain tumor diagnosis.  Denies any neck pain or stiffness.  Denies fevers, cough, shortness of breath, chest pain,  nausea, vomiting, abdominal pain, or diarrhea.  Review of Systems:  Review of Systems  All other systems reviewed and are negative.   Past Medical History:  Diagnosis Date   Arthritis    Cancer [facility])    Depression    Dyslipidemia    Essential hypertension 07/31/2015   Hypothyroidism 07/31/2015   doctor took her off medication    Past Surgical History:  Procedure Laterality Date   BLADDER SUSPENSION     CRANIOTOMY Right 03/04/2021   Procedure: CRANIOTOMY TUMOR RESECTION W/ BRAIN LAB;  Surgeon: Newman Pies, MD;  Location: [place];  Service: Neurosurgery;  Laterality: Right;   TONSILLECTOMY     TUBAL LIGATION       reports that she has been smoking cigarettes. She has a 28.50 pack-year smoking history. She has never used smokeless tobacco. She reports that she does not drink alcohol and does not use drugs.  Allergies  Allergen Reactions   Ranitidine Anaphylaxis    Family History  Problem Relation Age of Onset   Healthy Mother    Aneurysm Father    Hypertension Father    Hyperlipidemia Father    Healthy Sister    Healthy Daughter    Heart disease Son     Prior to Admission medications   Medication Sig Start Date End Date Taking? Authorizing Provider  acetaminophen (TYLENOL) 325 MG tablet Take 1-2 tablets (325-650 mg total) by mouth every 4 (four) hours as needed for mild pain. 03/28/22   Setzer, Edman Circle, PA-C  amitriptyline (ELAVIL) 100 MG tablet Take 2 tablets (200 mg total) by mouth at bedtime. 03/28/22   Setzer, Edman Circle, PA-C  atorvastatin (LIPITOR) 10 MG tablet  Take 10 mg by mouth at bedtime. 10/16/20   [provider]  calcium carbonate (TUMS - DOSED IN MG ELEMENTAL CALCIUM) 500 MG chewable tablet Chew 2 tablets (400 mg of elemental calcium total) by mouth 3 (three) times daily. 03/28/22   Setzer, Edman Circle, PA-C  cyclobenzaprine (FLEXERIL) 5 MG tablet Take 1-2 tablets (5-10 mg total) by mouth at bedtime. 03/28/22   Setzer, Edman Circle, PA-C  dexamethasone  (DECADRON) 1 MG tablet Take 3 tablets (3 mg total) by mouth daily with breakfast for 7 days, THEN 2 tablets (2 mg total) daily with breakfast for 7 days, THEN 1 tablet (1 mg total) daily with breakfast for 7 days. 04/08/22 04/28/22  Ventura Sellers, MD  diclofenac Sodium (VOLTAREN) 1 % GEL Apply 2 g topically 4 (four) times daily. 03/28/22   Setzer, Edman Circle, PA-C  enoxaparin (LOVENOX) 40 MG/0.4ML injection Inject 0.4 mLs (40 mg total) into the skin daily for 28 days. 03/29/22 04/26/22  Setzer, Edman Circle, PA-C  escitalopram (LEXAPRO) 10 MG tablet Take 10 mg by mouth at bedtime. 12/17/20   [provider]  gabapentin (NEURONTIN) 300 MG capsule Take 1 capsule (300 mg total) by mouth 3 (three) times daily. 03/28/22   Setzer, Edman Circle, PA-C  ibuprofen (ADVIL) 400 MG tablet Take 1 tablet (400 mg total) by mouth every 6 (six) hours as needed for mild pain or headache. 03/13/22   Rai, Vernelle Emerald, MD  Lacosamide 100 MG TABS Take 1 tablet (100 mg total) by mouth 2 (two) times daily. 03/28/22   Setzer, Edman Circle, PA-C  levETIRAcetam (KEPPRA) 750 MG tablet TAKE 2 TABLETS (1,500 MG TOTAL) BY MOUTH 2 (TWO) TIMES DAILY. 02/17/22   Ventura Sellers, MD  lisinopril (ZESTRIL) 5 MG tablet Take 1 tablet (5 mg total) by mouth at bedtime. 03/28/22   Setzer, Edman Circle, PA-C  melatonin 5 MG TABS Take 2 tablets (10 mg total) by mouth at bedtime. 03/28/22   Setzer, Edman Circle, PA-C  ondansetron (ZOFRAN) 8 MG tablet TAKE 1 TABLET (8 MG TOTAL) BY MOUTH 2 (TWO) TIMES DAILY AS NEEDED (NAUSEA AND VOMITING). MAY TAKE 30-60 MINUTES PRIOR TO TEMODAR ADMINISTRATION IF NAUSEA/VOMITING OCCURS. 04/01/22   Vaslow, Acey Lav, MD  oxyCODONE (OXY IR/ROXICODONE) 5 MG immediate release tablet Take 1-2 tablets (5-10 mg total) by mouth every 4 (four) hours as needed for breakthrough pain. 04/08/22   Ventura Sellers, MD  pantoprazole (PROTONIX) 40 MG tablet Take 1 tablet (40 mg total) by mouth 2 (two) times daily. 03/28/22   Setzer, Edman Circle, PA-C  polyethylene  glycol powder (GLYCOLAX/MIRALAX) 17 GM/SCOOP powder Take 1 capful (17 g) by mouth daily as needed for mild constipation. 03/28/22   Setzer, Edman Circle, PA-C  prochlorperazine (COMPAZINE) 10 MG tablet Take 1 tablet (10 mg total) by mouth every 6 (six) hours as needed for nausea or vomiting. 12/09/21   Vaslow, Acey Lav, MD  promethazine (PHENERGAN) 25 MG tablet TAKE 1 TABLET BY MOUTH EVERY 4 HOURS AS NEEDED. 04/01/22   Vaslow, Acey Lav, MD  senna-docusate (SENOKOT-S) 8.6-50 MG tablet Take 2 tablets by mouth at bedtime. 03/28/22   Setzer, Edman Circle, PA-C  sodium chloride 1 g tablet Take 1 tablet (1 g total) by mouth 2 (two) times daily with a meal. 03/28/22   Setzer, Edman Circle, PA-C  tamsulosin (FLOMAX) 0.4 MG CAPS capsule Take 1 capsule (0.4 mg total) by mouth daily after supper. 03/28/22   Setzer, Edman Circle, PA-C  temozolomide [facility])  100 MG capsule Take 4 capsules (400 mg total) by mouth daily. May take on an empty stomach to decrease nausea & vomiting. 03/25/22   Ventura Sellers, MD    Physical Exam: Vitals:   04/18/22 1730 04/18/22 1800 04/18/22 2030 04/18/22 2035  BP: 134/81 (!) 147/94 121/86   Pulse: 98 (!) 103 (!) 106   Resp: 20 20 20    Temp:      TempSrc:    Oral  SpO2: 97% 95% 98%   Weight:      Height:        Physical Exam Vitals reviewed.  Constitutional:      General: She is not in acute distress. HENT:     Head: Normocephalic and atraumatic.  Eyes:     Extraocular Movements: Extraocular movements intact.  Cardiovascular:     Rate and Rhythm: Regular rhythm. Tachycardia present.     Pulses: Normal pulses.     Comments: Mildly tachycardic Pulmonary:     Effort: Pulmonary effort is normal. No respiratory distress.     Breath sounds: Normal breath sounds. No wheezing or rales.  Abdominal:     General: Bowel sounds are normal. There is no distension.     Palpations: Abdomen is soft.     Tenderness: There is no abdominal tenderness.  Musculoskeletal:     Cervical back: Normal  range of motion. No rigidity.  Skin:    General: Skin is warm and dry.  Neurological:     Mental Status: She is alert and oriented to person, place, and time.     Comments: Left lower extremity weakness (ongoing since hospitalization last month per patient)     Labs on Admission: I have personally reviewed following labs and imaging studies  CBC: Recent Labs  Lab 04/18/22 1235  WBC 5.1  HGB 10.5*  HCT 29.5*  MCV 105.4*  PLT 99991111*   Basic Metabolic Panel: Recent Labs  Lab 04/18/22 1235  NA 135  K 3.7  CL 101  CO2 24  GLUCOSE 123*  BUN 18  CREATININE 0.41*  CALCIUM 8.5*   GFR: Estimated Creatinine Clearance: 84.4 mL/min (A) (by C-G formula based on SCr of 0.41 mg/dL (L)). Liver Function Tests: Recent Labs  Lab 04/18/22 1235  AST 14*  ALT 31  ALKPHOS 84  BILITOT 0.8  PROT 6.2*  ALBUMIN 2.9*   No results for input(s): "LIPASE", "AMYLASE" in the last 168 hours. No results for input(s): "AMMONIA" in the last 168 hours. Coagulation Profile: No results for input(s): "INR", "PROTIME" in the last 168 hours. Cardiac Enzymes: No results for input(s): "CKTOTAL", "CKMB", "CKMBINDEX", "TROPONINI" in the last 168 hours. BNP (last 3 results) No results for input(s): "PROBNP" in the last 8760 hours. HbA1C: No results for input(s): "HGBA1C" in the last 72 hours. CBG: Recent Labs  Lab 04/18/22 1232  GLUCAP 122*   Lipid Profile: No results for input(s): "CHOL", "HDL", "LDLCALC", "TRIG", "CHOLHDL", "LDLDIRECT" in the last 72 hours. Thyroid Function Tests: No results for input(s): "TSH", "T4TOTAL", "FREET4", "T3FREE", "THYROIDAB" in the last 72 hours. Anemia Panel: No results for input(s): "VITAMINB12", "FOLATE", "FERRITIN", "TIBC", "IRON", "RETICCTPCT" in the last 72 hours. Urine analysis:    Component Value Date/Time   COLORURINE YELLOW 04/18/2022 [place] 04/18/2022 1233   LABSPEC 1.008 04/18/2022 1233   PHURINE 5.0 04/18/2022 1233   GLUCOSEU  NEGATIVE 04/18/2022 1233   HGBUR MODERATE (A) 04/18/2022 1233   BILIRUBINUR NEGATIVE 04/18/2022 1233   KETONESUR NEGATIVE 04/18/2022  [place] 04/18/2022 1233   UROBILINOGEN 0.2 07/31/2009 0959   NITRITE NEGATIVE 04/18/2022 1233   LEUKOCYTESUR NEGATIVE 04/18/2022 1233    Radiological Exams on Admission: VAS [place] LOWER EXTREMITY VENOUS (DVT) (7a-7p)  Result Date: 04/18/2022  Lower Venous DVT Study Patient Name:  SHALESE GEDNEY  Date of Exam:   04/18/2022 Medical Rec #: [id number]       Accession #:    [id number] Date of Birth: [birth year]      Patient Gender: F Patient Age:   26 years Exam Location:  [facility] Procedure:      VAS [place] LOWER EXTREMITY VENOUS (DVT) Referring Phys: Vonzella Nipple BLUE --------------------------------------------------------------------------------  Indications: Left lower extremity pain- posterior knee/distal thigh and anterior ankle.  Risk Factors: Cancer Cancer. Comparison Study: 03-07-2022 Prior left lower extremity venous duplex was                   negative for DVT. Performing Technologist: Darlin Coco RDMS, RVT  Examination Guidelines: A complete evaluation includes B-mode imaging, spectral Doppler, color Doppler, and power Doppler as needed of all accessible portions of each vessel. Bilateral testing is considered an integral part of a complete examination. Limited examinations for reoccurring indications may be performed as noted. The reflux portion of the exam is performed with the patient in reverse Trendelenburg.  +-----+---------------+---------+-----------+----------+--------------+ RIGHTCompressibilityPhasicitySpontaneityPropertiesThrombus Aging +-----+---------------+---------+-----------+----------+--------------+ CFV  Full           Yes      Yes                                 +-----+---------------+---------+-----------+----------+--------------+   +---------+---------------+---------+-----------+----------+--------------+  LEFT     CompressibilityPhasicitySpontaneityPropertiesThrombus Aging +---------+---------------+---------+-----------+----------+--------------+ CFV      Full           Yes      Yes                                 +---------+---------------+---------+-----------+----------+--------------+ SFJ      Full                                                        +---------+---------------+---------+-----------+----------+--------------+ FV Prox  Full                                                        +---------+---------------+---------+-----------+----------+--------------+ FV Mid   Full                                                        +---------+---------------+---------+-----------+----------+--------------+ FV DistalFull                                                        +---------+---------------+---------+-----------+----------+--------------+  PFV      Full                                                        +---------+---------------+---------+-----------+----------+--------------+ POP      Full           Yes      Yes                                 +---------+---------------+---------+-----------+----------+--------------+ PTV      Full                                                        +---------+---------------+---------+-----------+----------+--------------+ PERO     Full                                                        +---------+---------------+---------+-----------+----------+--------------+    Summary: RIGHT: - No evidence of common femoral vein obstruction.  LEFT: - There is no evidence of deep vein thrombosis in the lower extremity.  - No cystic structure found in the popliteal fossa.  - Ultrasound characteristics of enlarged lymph nodes noted in the groin.  *See table(s) above for measurements and observations. Electronically signed by Harold Barban MD on 04/18/2022 at 8:15:29 PM.    Final    MR Brain W and  Wo Contrast  Result Date: 04/18/2022 CLINICAL DATA:  Brain neoplasm.  Altered mental status. EXAM: MRI HEAD WITHOUT AND WITH CONTRAST TECHNIQUE: Multiplanar, multiecho pulse sequences of the brain and surrounding structures were obtained without and with intravenous contrast. CONTRAST:  48mL GADAVIST GADOBUTROL 1 MMOL/ML IV SOLN COMPARISON:  02/28/2022 FINDINGS: Brain: No acute infarct, mass effect or extra-axial collection. No chronic microhemorrhage or siderosis. Confluent hyperintense T2-weighted signal within the right hemispheric white matter has progressed. Encephalomalacia of the anterior right temporal lobe. Unchanged nodular focus of contrast enhancement at the lateral aspect of the inferior right temporal lobe measures 11 x 10 mm, unchanged (series 16, image 55). No new site of abnormal contrast enhancement. Vascular: Normal flow voids. Skull and upper cervical spine: Normal marrow signal. Sinuses/Orbits: Negative. Other: None. IMPRESSION: 1. Unchanged size of 11 x 10 mm anterior right temporal lobe lesion 2. No new site of abnormal contrast enhancement. 3. Progression of right hemispheric white matter hyperintense T2-weighted signal, which may be radiation treatment related. Electronically Signed   By: Ulyses Jarred M.D.   On: 04/18/2022 19:36   CT Head Wo Contrast  Result Date: 04/18/2022 CLINICAL DATA:  Provided history: Mental status change, unknown cause. EXAM: CT HEAD WITHOUT CONTRAST TECHNIQUE: Contiguous axial images were obtained from the base of the skull through the vertex without intravenous contrast. RADIATION DOSE REDUCTION: This exam was performed according to the departmental dose-optimization program which includes automated exposure control, adjustment of the mA and/or kV according to patient size and/or use of iterative reconstruction technique. COMPARISON:  Head CT 03/25/2022. Prior brain MRI examinations 02/28/2022  and earlier. FINDINGS: Brain: Mild generalized cerebral atrophy.  Redemonstrated postoperative changes within the right temporal lobe from prior mass resection. There is inadequate reassessment for tumor progression on this non-contrast head CT. However, no progressive mass effect is noted at the resection site. Ill-defined hypoattenuation within the white matter adjacent to the resection site, and within the right frontoparietal lobes, which may reflect treatment related changes and/or infiltrative tumor. There is no acute intracranial hemorrhage. No acute demarcated cortical infarct. No extra-axial fluid collection. No midline shift. Vascular: No hyperdense vessel. Atherosclerotic calcifications. Skull: Right temporoparietal cranioplasty. Sinuses/Orbits: No orbital mass or acute orbital finding. No significant paranasal sinus disease at the imaged levels. IMPRESSION: 1. No evidence of acute intracranial hemorrhage or acute infarct. 2. Redemonstrated postoperative changes within the right temporal lobe from prior mass resection. There is inadequate reassessment for tumor progression on this non-contrast head CT. However, no progressive mass effect is noted at the resection site. A brain MRI (without and with contrast) may be obtained for further evaluation, as clinically warranted. 3. Ill-defined hypoattenuation within the white matter surrounding the resection site, and within the right frontoparietal lobes, which may reflect treatment related changes and/or infiltrative tumor. 4. Mild generalized cerebral atrophy. Electronically Signed   By: Kellie Simmering D.O.   On: 04/18/2022 16:19   DG Chest Portable 1 View  Result Date: 04/18/2022 CLINICAL DATA:  Altered mental status.  Increased confusion EXAM: PORTABLE CHEST 1 VIEW COMPARISON:  03/24/2022 FINDINGS: The patient is rotated to the left on today's radiograph, reducing diagnostic sensitivity and specificity. Large hiatal hernia containing stomach and colon. This obscures the cardiac contour on the right and is associated  with passive atelectasis. There is previously airspace opacity along the lingula and/or left lower lobe, this is no longer present. No blunting of the costophrenic angles. Atherosclerotic calcification of the aortic arch. IMPRESSION: 1. Large hiatal hernia containing stomach and colon. This obscures the cardiac contour on the right and is associated with passive atelectasis. 2. No acute findings. Prior left basilar airspace opacity has cleared. 3.  Aortic Atherosclerosis (ICD10-I70.0). Electronically Signed   By: Van Clines M.D.   On: 04/18/2022 13:25    EKG: Independently reviewed.  Sinus tachycardia, no significant change since prior tracing.  Assessment and Plan  Acute encephalopathy Patient presenting with confusion x 1 week.  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 ?Focal seizure.  No fever, leukocytosis, or nuchal rigidity to suggest meningitis.  Currently AAO x 4 and answering questions appropriately.  MRI brain showing stable anterior right temporal lobe lesion, no new site of abnormal contrast-enhancement, and progression of right hemispheric white matter hyperintense T2 weighted signal which may be radiation treatment related.  Recently treated for UTI and UA done today not suggestive of infection.  TSH normal on 03/25/2022.  Check ammonia level.  EEG ordered.  Right temporal glioblastoma with metastatic disease to the thoracic spinal cord Admit last month for left lower extremity weakness and treated with steroids, chemotherapy, and palliative radiation therapy.  MRI brain done today for evaluation of confusion showing stable findings.  Sinus tachycardia No obvious infectious etiology.  Cardiac monitoring, continue IV fluid hydration.  Recent TSH normal.  Anemia Likely related to cancer treatment.  Hemoglobin currently 10.5, was 13.4 on 03/25/2022.  No signs of bleeding, continue to monitor  CBC.  Thrombocytopenia Likely related to cancer treatment.  Platelet count 101k, no signs of bleeding.  Continue to monitor CBC.  Hypertension:  Stable. Hyperlipidemia Depression Pharmacy med rec pending.  DVT prophylaxis: SCDs Code Status: DNR (discussed with the patient) Family Communication: Mother at bedside. Level of care: Telemetry bed Admission status: It is my clinical opinion that referral for OBSERVATION is reasonable and necessary in this patient based on the above information provided. The aforementioned taken together are felt to place the patient at high risk for further clinical deterioration. However, it is anticipated that the patient may be medically stable for discharge from the hospital within 24 to 48 hours.   Shela Leff MD [facility]  If 7PM-7AM, please contact night-coverage [email or web address]  04/18/2022, 9:00 PM

## 2022-04-18 NOTE — ED Notes (Signed)
ED TO INPATIENT HANDOFF REPORT  ED Nurse Name and Phone #: Renette Butters Name/Age/Gender Kelly [name] 57 y.o. female Room/Bed: WA06/WA06  Code Status   Code Status: Prior  Home/SNF/Other Home Patient oriented to: self, intermittent periods of disorientation and sometimes completely orientedx4 Is this baseline? Yes ; brain tumor and spinal tumor causing symptoms  Triage Complete: Triage complete  Chief Complaint Acute encephalopathy [G93.40]  Triage Note BIB EMS from home for increased confusion over the last week. Oncologist wanted pt checked out due to the ongoing confusion. Pt is alert on arrival. Pt has been intermittently disorientedx4 and has had difficulty ambulating. Pt has no motor function in left leg and has significant left arm weakness as of 3 weeks ago. Currently being treated for UTI, last dose tomorrow.  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    Allergies Allergies  Allergen Reactions   Ranitidine Anaphylaxis    Level of Care/Admitting Diagnosis ED Disposition     ED Disposition  Admit   Condition  --   Comment  Hospital Area: [facility] [id number]  Level of Care: Telemetry [5]  Admit to tele based on following criteria: Complex arrhythmia (Bradycardia/Tachycardia)  May place patient in observation at [facility] or [place] [facility] if equivalent level of care is available:: Yes  Covid Evaluation: Asymptomatic - no recent exposure (last 10 days) testing not required  Diagnosis: Acute encephalopathy [id number]  Admitting Physician: Shela Leff [id number]  Attending Physician: Shela Leff [id number]          B Medical/Surgery History Past Medical History:  Diagnosis Date   Arthritis    Cancer ([place])    Depression    Dyslipidemia    Essential hypertension 07/31/2015   Hypothyroidism 07/31/2015   doctor took her off medication   Past Surgical History:  Procedure Laterality Date   BLADDER  SUSPENSION     CRANIOTOMY Right 03/04/2021   Procedure: CRANIOTOMY TUMOR RESECTION W/ BRAIN LAB;  Surgeon: Newman Pies, MD;  Location: [facility];  Service: Neurosurgery;  Laterality: Right;   TONSILLECTOMY     TUBAL LIGATION       A IV Location/Drains/Wounds Patient Lines/Drains/Airways Status     Active Line/Drains/Airways     Name Placement date Placement time Site Days   Peripheral IV 04/18/22 22 G Right Antecubital 04/18/22  1234  Antecubital  less than 1            Intake/Output Last 24 hours  Intake/Output Summary (Last 24 hours) at 04/18/2022 2134 Last data filed at 04/18/2022 1826 Gross per 24 hour  Intake 500 ml  Output 1200 ml  Net -700 ml    Labs/Imaging Results for orders placed or performed during the hospital encounter of 04/18/22 (from the past 48 hour(s))  CBG monitoring, ED     Status: Abnormal   Collection Time: 04/18/22 12:32 PM  Result Value Ref Range   Glucose-Capillary 122 (H) 70 - 99 mg/dL    Comment: Glucose reference range applies only to samples taken after fasting for at least 8 hours.  Urinalysis, Routine w reflex microscopic -Urine, Clean Catch     Status: Abnormal   Collection Time: 04/18/22 12:33 PM  Result Value Ref Range   Color, Urine YELLOW YELLOW   APPearance CLEAR CLEAR   Specific Gravity, Urine 1.008 1.005 - 1.030   pH 5.0 5.0 - 8.0   Glucose, UA NEGATIVE NEGATIVE mg/dL   Hgb urine dipstick MODERATE (A) NEGATIVE   Bilirubin  Urine NEGATIVE NEGATIVE   Ketones, ur NEGATIVE NEGATIVE mg/dL   Protein, ur NEGATIVE NEGATIVE mg/dL   Nitrite NEGATIVE NEGATIVE   Leukocytes,Ua NEGATIVE NEGATIVE   RBC / HPF 0-5 0 - 5 RBC/hpf   WBC, UA 0-5 0 - 5 WBC/hpf   Bacteria, UA NONE SEEN NONE SEEN   Squamous Epithelial / HPF 0-5 0 - 5 /HPF    Comment: Performed at [facility], [place] [address], [place], [place] [address]  Comprehensive metabolic panel     Status: Abnormal   Collection Time: 04/18/22 12:35 PM  Result Value Ref  Range   Sodium 135 135 - 145 mmol/L   Potassium 3.7 3.5 - 5.1 mmol/L   Chloride 101 98 - 111 mmol/L   CO2 24 22 - 32 mmol/L   Glucose, Bld 123 (H) 70 - 99 mg/dL    Comment: Glucose reference range applies only to samples taken after fasting for at least 8 hours.   BUN 18 6 - 20 mg/dL   Creatinine, Ser 0.41 (L) 0.44 - 1.00 mg/dL   Calcium 8.5 (L) 8.9 - 10.3 mg/dL   Total Protein 6.2 (L) 6.5 - 8.1 g/dL   Albumin 2.9 (L) 3.5 - 5.0 g/dL   AST 14 (L) 15 - 41 U/L   ALT 31 0 - 44 U/L   Alkaline Phosphatase 84 38 - 126 U/L   Total Bilirubin 0.8 0.3 - 1.2 mg/dL   GFR, Estimated >60 >60 mL/min    Comment: (NOTE) Calculated using the CKD-EPI Creatinine Equation (2021)    Anion gap 10 5 - 15    Comment: Performed at [facility], [place] [address], [place], [place] [address]  CBC     Status: Abnormal   Collection Time: 04/18/22 12:35 PM  Result Value Ref Range   WBC 5.1 4.0 - 10.5 K/uL   RBC 2.80 (L) 3.87 - 5.11 MIL/uL   Hemoglobin 10.5 (L) 12.0 - 15.0 g/dL   HCT 29.5 (L) 36.0 - 46.0 %   MCV 105.4 (H) 80.0 - 100.0 fL   MCH 37.5 (H) 26.0 - 34.0 pg   MCHC 35.6 30.0 - 36.0 g/dL   RDW 14.7 11.5 - 15.5 %   Platelets 101 (L) 150 - 400 K/uL   nRBC 0.0 0.0 - 0.2 %    Comment: Performed at [facility], [place] [address], [place], [place] [address]   VAS [place] LOWER EXTREMITY VENOUS (DVT) (7a-7p)  Result Date: 04/18/2022  Lower Venous DVT Study Patient Name:  Kelly [name]  Date of Exam:   04/18/2022 Medical Rec #: [id number]       Accession #:    [id number] Date of Birth: 04-05-1965      Patient Gender: F Patient Age:   1 years Exam Location:  [facility] Procedure:      VAS [place] LOWER EXTREMITY VENOUS (DVT) Referring Phys: [place] --------------------------------------------------------------------------------  Indications: Left lower extremity pain- posterior knee/distal thigh and anterior ankle.  Risk Factors: Cancer Cancer. Comparison Study: 03-07-2022  Prior left lower extremity venous duplex was                   negative for DVT. Performing Technologist: Darlin Coco RDMS, RVT  Examination Guidelines: A complete evaluation includes B-mode imaging, spectral Doppler, color Doppler, and power Doppler as needed of all accessible portions of each vessel. Bilateral testing is considered an integral part of a complete examination. Limited examinations for reoccurring indications may be  performed as noted. The reflux portion of the exam is performed with the patient in reverse Trendelenburg.  +-----+---------------+---------+-----------+----------+--------------+ RIGHTCompressibilityPhasicitySpontaneityPropertiesThrombus Aging +-----+---------------+---------+-----------+----------+--------------+ CFV  Full           Yes      Yes                                 +-----+---------------+---------+-----------+----------+--------------+   +---------+---------------+---------+-----------+----------+--------------+ LEFT     CompressibilityPhasicitySpontaneityPropertiesThrombus Aging +---------+---------------+---------+-----------+----------+--------------+ CFV      Full           Yes      Yes                                 +---------+---------------+---------+-----------+----------+--------------+ SFJ      Full                                                        +---------+---------------+---------+-----------+----------+--------------+ FV Prox  Full                                                        +---------+---------------+---------+-----------+----------+--------------+ FV Mid   Full                                                        +---------+---------------+---------+-----------+----------+--------------+ FV DistalFull                                                        +---------+---------------+---------+-----------+----------+--------------+ PFV      Full                                                         +---------+---------------+---------+-----------+----------+--------------+ POP      Full           Yes      Yes                                 +---------+---------------+---------+-----------+----------+--------------+ PTV      Full                                                        +---------+---------------+---------+-----------+----------+--------------+ PERO     Full                                                        +---------+---------------+---------+-----------+----------+--------------+  Summary: RIGHT: - No evidence of common femoral vein obstruction.  LEFT: - There is no evidence of deep vein thrombosis in the lower extremity.  - No cystic structure found in the popliteal fossa.  - Ultrasound characteristics of enlarged lymph nodes noted in the groin.  *See table(s) above for measurements and observations. Electronically signed by Harold Barban MD on 04/18/2022 at 8:15:29 PM.    Final    MR Brain W and Wo Contrast  Result Date: 04/18/2022 CLINICAL DATA:  Brain neoplasm.  Altered mental status. EXAM: MRI HEAD WITHOUT AND WITH CONTRAST TECHNIQUE: Multiplanar, multiecho pulse sequences of the brain and surrounding structures were obtained without and with intravenous contrast. CONTRAST:  64mL GADAVIST GADOBUTROL 1 MMOL/ML IV SOLN COMPARISON:  02/28/2022 FINDINGS: Brain: No acute infarct, mass effect or extra-axial collection. No chronic microhemorrhage or siderosis. Confluent hyperintense T2-weighted signal within the right hemispheric white matter has progressed. Encephalomalacia of the anterior right temporal lobe. Unchanged nodular focus of contrast enhancement at the lateral aspect of the inferior right temporal lobe measures 11 x 10 mm, unchanged (series 16, image 55). No new site of abnormal contrast enhancement. Vascular: Normal flow voids. Skull and upper cervical spine: Normal marrow signal. Sinuses/Orbits: Negative. Other: None. IMPRESSION: 1.  Unchanged size of 11 x 10 mm anterior right temporal lobe lesion 2. No new site of abnormal contrast enhancement. 3. Progression of right hemispheric white matter hyperintense T2-weighted signal, which may be radiation treatment related. Electronically Signed   By: Ulyses Jarred M.D.   On: 04/18/2022 19:36   CT Head Wo Contrast  Result Date: 04/18/2022 CLINICAL DATA:  Provided history: Mental status change, unknown cause. EXAM: CT HEAD WITHOUT CONTRAST TECHNIQUE: Contiguous axial images were obtained from the base of the skull through the vertex without intravenous contrast. RADIATION DOSE REDUCTION: This exam was performed according to the departmental dose-optimization program which includes automated exposure control, adjustment of the mA and/or kV according to patient size and/or use of iterative reconstruction technique. COMPARISON:  Head CT 03/25/2022. Prior brain MRI examinations 02/28/2022 and earlier. FINDINGS: Brain: Mild generalized cerebral atrophy. Redemonstrated postoperative changes within the right temporal lobe from prior mass resection. There is inadequate reassessment for tumor progression on this non-contrast head CT. However, no progressive mass effect is noted at the resection site. Ill-defined hypoattenuation within the white matter adjacent to the resection site, and within the right frontoparietal lobes, which may reflect treatment related changes and/or infiltrative tumor. There is no acute intracranial hemorrhage. No acute demarcated cortical infarct. No extra-axial fluid collection. No midline shift. Vascular: No hyperdense vessel. Atherosclerotic calcifications. Skull: Right temporoparietal cranioplasty. Sinuses/Orbits: No orbital mass or acute orbital finding. No significant paranasal sinus disease at the imaged levels. IMPRESSION: 1. No evidence of acute intracranial hemorrhage or acute infarct. 2. Redemonstrated postoperative changes within the right temporal lobe from prior mass  resection. There is inadequate reassessment for tumor progression on this non-contrast head CT. However, no progressive mass effect is noted at the resection site. A brain MRI (without and with contrast) may be obtained for further evaluation, as clinically warranted. 3. Ill-defined hypoattenuation within the white matter surrounding the resection site, and within the right frontoparietal lobes, which may reflect treatment related changes and/or infiltrative tumor. 4. Mild generalized cerebral atrophy. Electronically Signed   By: Kellie Simmering D.O.   On: 04/18/2022 16:19   DG Chest Portable 1 View  Result Date: 04/18/2022 CLINICAL DATA:  Altered mental status.  Increased confusion EXAM: PORTABLE CHEST 1 VIEW  COMPARISON:  03/24/2022 FINDINGS: The patient is rotated to the left on today's radiograph, reducing diagnostic sensitivity and specificity. Large hiatal hernia containing stomach and colon. This obscures the cardiac contour on the right and is associated with passive atelectasis. There is previously airspace opacity along the lingula and/or left lower lobe, this is no longer present. No blunting of the costophrenic angles. Atherosclerotic calcification of the aortic arch. IMPRESSION: 1. Large hiatal hernia containing stomach and colon. This obscures the cardiac contour on the right and is associated with passive atelectasis. 2. No acute findings. Prior left basilar airspace opacity has cleared. 3.  Aortic Atherosclerosis (ICD10-I70.0). Electronically Signed   By: Van Clines M.D.   On: 04/18/2022 13:25    Pending Labs Unresulted Labs (From admission, onward)     Start     Ordered   04/18/22 2124  Ammonia  Once,   STAT        04/18/22 2123            Vitals/Pain Today's Vitals   04/18/22 1730 04/18/22 1800 04/18/22 2030 04/18/22 2035  BP: 134/81 (!) 147/94 121/86   Pulse: 98 (!) 103 (!) 106   Resp: 20 20 20    Temp:      TempSrc:    Oral  SpO2: 97% 95% 98%   Weight:      Height:       PainSc:        Isolation Precautions No active isolations  Medications Medications  sodium chloride 0.9 % bolus 500 mL (0 mLs Intravenous Stopped 04/18/22 1424)  acetaminophen (TYLENOL) tablet 650 mg (650 mg Oral Given 04/18/22 1452)  gadobutrol (GADAVIST) 1 MMOL/ML injection 9 mL (9 mLs Intravenous Contrast Given 04/18/22 1851)    Mobility non-ambulatory; left leg has no movement due to brain tumor      Focused Assessments     R Recommendations: See Admitting Provider Note  Report given to:   Additional Notes:

## 2022-04-18 NOTE — ED Notes (Signed)
Pt is having increased confusion. Pt trying to get out of bed and stating she can walk. Left leg has no movement and has been like that for 3 weeks. [place], [place] notified

## 2022-04-18 NOTE — ED Provider Notes (Signed)
[facility] Provider Note   CSN: [id number] Arrival date & time: 04/18/22  1154     History  Chief Complaint  Patient presents with   Altered Mental Status    Kelly [name] is a 57 y.o. female with a past medical history of hypertension, hyperlipidemia, glioblastoma, metastatic cancer to spine, who presents emergency department concerns for altered mental status onset today.  Patient is currently receiving chemotherapy for her spinal  cancer.  Her last chemotherapy treatment was on 04/11/2022.  She is followed by oncology with Dr. Mickeal Skinner who instructed her to come into the emergency department due to concerns for ultimate cystitis.  Patient has associated headache and left leg pain.  Patient also notes residual left lower extremity weakness that has been going on for over a month secondary to her spinal cancer.  Patient is currently being treated for UTI with her last dose of antibiotic being today.  Patient denies chest pain, shortness of breath, abdominal pain, nausea, vomiting, neck pain, dizziness, lightheadedness.  The history is provided by the patient. No language interpreter was used.       Home Medications Prior to Admission medications   Medication Sig Start Date End Date Taking? Authorizing Provider  acetaminophen (TYLENOL) 325 MG tablet Take 1-2 tablets (325-650 mg total) by mouth every 4 (four) hours as needed for mild pain. 03/28/22   Setzer, Edman Circle, PA-C  amitriptyline (ELAVIL) 100 MG tablet Take 2 tablets (200 mg total) by mouth at bedtime. 03/28/22   Setzer, Edman Circle, PA-C  atorvastatin (LIPITOR) 10 MG tablet Take 10 mg by mouth at bedtime. 10/16/20   [provider]  calcium carbonate (TUMS - DOSED IN MG ELEMENTAL CALCIUM) 500 MG chewable tablet Chew 2 tablets (400 mg of elemental calcium total) by mouth 3 (three) times daily. 03/28/22   Setzer, Edman Circle, PA-C  cyclobenzaprine (FLEXERIL) 5 MG tablet Take 1-2 tablets  (5-10 mg total) by mouth at bedtime. 03/28/22   Setzer, Edman Circle, PA-C  dexamethasone (DECADRON) 1 MG tablet Take 3 tablets (3 mg total) by mouth daily with breakfast for 7 days, THEN 2 tablets (2 mg total) daily with breakfast for 7 days, THEN 1 tablet (1 mg total) daily with breakfast for 7 days. 04/08/22 04/28/22  Ventura Sellers, MD  diclofenac Sodium (VOLTAREN) 1 % GEL Apply 2 g topically 4 (four) times daily. 03/28/22   Setzer, Edman Circle, PA-C  enoxaparin (LOVENOX) 40 MG/0.4ML injection Inject 0.4 mLs (40 mg total) into the skin daily for 28 days. 03/29/22 04/26/22  Setzer, Edman Circle, PA-C  escitalopram (LEXAPRO) 10 MG tablet Take 10 mg by mouth at bedtime. 12/17/20   [provider]  gabapentin (NEURONTIN) 300 MG capsule Take 1 capsule (300 mg total) by mouth 3 (three) times daily. 03/28/22   Setzer, Edman Circle, PA-C  ibuprofen (ADVIL) 400 MG tablet Take 1 tablet (400 mg total) by mouth every 6 (six) hours as needed for mild pain or headache. 03/13/22   Rai, Vernelle Emerald, MD  Lacosamide 100 MG TABS Take 1 tablet (100 mg total) by mouth 2 (two) times daily. 03/28/22   Setzer, Edman Circle, PA-C  levETIRAcetam (KEPPRA) 750 MG tablet TAKE 2 TABLETS (1,500 MG TOTAL) BY MOUTH 2 (TWO) TIMES DAILY. 02/17/22   Ventura Sellers, MD  lisinopril (ZESTRIL) 5 MG tablet Take 1 tablet (5 mg total) by mouth at bedtime. 03/28/22   Setzer, Edman Circle, PA-C  melatonin 5 MG TABS Take  2 tablets (10 mg total) by mouth at bedtime. 03/28/22   Setzer, Edman Circle, PA-C  ondansetron (ZOFRAN) 8 MG tablet TAKE 1 TABLET (8 MG TOTAL) BY MOUTH 2 (TWO) TIMES DAILY AS NEEDED (NAUSEA AND VOMITING). MAY TAKE 30-60 MINUTES PRIOR TO TEMODAR ADMINISTRATION IF NAUSEA/VOMITING OCCURS. 04/01/22   Vaslow, Acey Lav, MD  oxyCODONE (OXY IR/ROXICODONE) 5 MG immediate release tablet Take 1-2 tablets (5-10 mg total) by mouth every 4 (four) hours as needed for breakthrough pain. 04/08/22   Ventura Sellers, MD  pantoprazole (PROTONIX) 40 MG tablet Take 1 tablet (40 mg  total) by mouth 2 (two) times daily. 03/28/22   Setzer, Edman Circle, PA-C  polyethylene glycol powder (GLYCOLAX/MIRALAX) 17 GM/SCOOP powder Take 1 capful (17 g) by mouth daily as needed for mild constipation. 03/28/22   Setzer, Edman Circle, PA-C  prochlorperazine (COMPAZINE) 10 MG tablet Take 1 tablet (10 mg total) by mouth every 6 (six) hours as needed for nausea or vomiting. 12/09/21   Vaslow, Acey Lav, MD  promethazine (PHENERGAN) 25 MG tablet TAKE 1 TABLET BY MOUTH EVERY 4 HOURS AS NEEDED. 04/01/22   Vaslow, Acey Lav, MD  senna-docusate (SENOKOT-S) 8.6-50 MG tablet Take 2 tablets by mouth at bedtime. 03/28/22   Setzer, Edman Circle, PA-C  sodium chloride 1 g tablet Take 1 tablet (1 g total) by mouth 2 (two) times daily with a meal. 03/28/22   Setzer, Edman Circle, PA-C  tamsulosin (FLOMAX) 0.4 MG CAPS capsule Take 1 capsule (0.4 mg total) by mouth daily after supper. 03/28/22   Setzer, Edman Circle, PA-C  temozolomide (TEMODAR) 100 MG capsule Take 4 capsules (400 mg total) by mouth daily. May take on an empty stomach to decrease nausea & vomiting. 03/25/22   Ventura Sellers, MD      Allergies    Ranitidine    Review of Systems   Review of Systems  All other systems reviewed and are negative.   Physical Exam Updated Vital Signs BP 111/79   Pulse (!) 109   Temp 97.8 F (36.6 C) (Oral)   Resp 15   Ht 5\' 4"  (1.626 m)   Wt 90.3 kg   SpO2 95%   BMI 34.17 kg/m  Physical Exam Vitals and nursing note reviewed.  Constitutional:      General: She is not in acute distress.    Appearance: She is not diaphoretic.  HENT:     Head: Normocephalic and atraumatic.     Mouth/Throat:     Pharynx: No oropharyngeal exudate.  Eyes:     General: No scleral icterus.    Conjunctiva/sclera: Conjunctivae normal.  Cardiovascular:     Rate and Rhythm: Normal rate and regular rhythm.     Pulses: Normal pulses.     Heart sounds: Normal heart sounds.  Pulmonary:     Effort: Pulmonary effort is normal. No respiratory distress.      Breath sounds: Normal breath sounds. No wheezing.  Chest:     Chest wall: No tenderness.     Comments: No chest wall TTP.  Abdominal:     General: Bowel sounds are normal.     Palpations: Abdomen is soft. There is no mass.     Tenderness: There is no abdominal tenderness. There is no guarding or rebound.     Comments: No abdominal TTP.   Musculoskeletal:        General: Normal range of motion.     Cervical back: Normal range of motion and neck supple.  Comments: TTP noted to thoracic spine. No C, L, S spinal TTP.  Grip strength 5/5 bilaterally.  Negative pronator drift. TTP noted to LLE  Skin:    General: Skin is warm and dry.  Neurological:     Mental Status: She is alert.  Psychiatric:        Behavior: Behavior normal.     ED Results / Procedures / Treatments   Labs (all labs ordered are listed, but only abnormal results are displayed) Labs Reviewed  COMPREHENSIVE METABOLIC PANEL - Abnormal; Notable for the following components:      Result Value   Glucose, Bld 123 (*)    Creatinine, Ser 0.41 (*)    Calcium 8.5 (*)    Total Protein 6.2 (*)    Albumin 2.9 (*)    AST 14 (*)    All other components within normal limits  CBC - Abnormal; Notable for the following components:   RBC 2.80 (*)    Hemoglobin 10.5 (*)    HCT 29.5 (*)    MCV 105.4 (*)    MCH 37.5 (*)    Platelets 101 (*)    All other components within normal limits  URINALYSIS, ROUTINE W REFLEX MICROSCOPIC - Abnormal; Notable for the following components:   Hgb urine dipstick MODERATE (*)    All other components within normal limits  CBG MONITORING, ED - Abnormal; Notable for the following components:   Glucose-Capillary 122 (*)    All other components within normal limits  AMMONIA    EKG EKG Interpretation  Date/Time:  Friday April 18 2022 12:13:09 EDT Ventricular Rate:  109 PR Interval:  127 QRS Duration: 91 QT Interval:  326 QTC Calculation: 439 R Axis:   30 Text Interpretation: Sinus  tachycardia Probable left atrial enlargement Confirmed by Kommor, Madison ([id number]) on 04/18/2022 12:56:54 PM  Radiology VAS [place] LOWER EXTREMITY VENOUS (DVT) (7a-7p)  Result Date: 04/18/2022  Lower Venous DVT Study Patient Name:  KEONTAE CH  Date of Exam:   04/18/2022 Medical Rec #: [id number]       Accession #:    [id number] Date of Birth: November 16, 1965      Patient Gender: F Patient Age:   14 years Exam Location:  [facility] Procedure:      VAS [place] LOWER EXTREMITY VENOUS (DVT) Referring Phys: Vonzella Nipple Alana [place] --------------------------------------------------------------------------------  Indications: Left lower extremity pain- posterior knee/distal thigh and anterior ankle.  Risk Factors: Cancer Cancer. Comparison Study: 03-07-2022 Prior left lower extremity venous duplex was                   negative for DVT. Performing Technologist: Darlin Coco RDMS, RVT  Examination Guidelines: A complete evaluation includes B-mode imaging, spectral Doppler, color Doppler, and power Doppler as needed of all accessible portions of each vessel. Bilateral testing is considered an integral part of a complete examination. Limited examinations for reoccurring indications may be performed as noted. The reflux portion of the exam is performed with the patient in reverse Trendelenburg.  +-----+---------------+---------+-----------+----------+--------------+ RIGHTCompressibilityPhasicitySpontaneityPropertiesThrombus Aging +-----+---------------+---------+-----------+----------+--------------+ CFV  Full           Yes      Yes                                 +-----+---------------+---------+-----------+----------+--------------+   +---------+---------------+---------+-----------+----------+--------------+ LEFT     CompressibilityPhasicitySpontaneityPropertiesThrombus Aging +---------+---------------+---------+-----------+----------+--------------+ CFV      Full  Yes      Yes                                  +---------+---------------+---------+-----------+----------+--------------+ SFJ      Full                                                        +---------+---------------+---------+-----------+----------+--------------+ FV Prox  Full                                                        +---------+---------------+---------+-----------+----------+--------------+ FV Mid   Full                                                        +---------+---------------+---------+-----------+----------+--------------+ FV DistalFull                                                        +---------+---------------+---------+-----------+----------+--------------+ PFV      Full                                                        +---------+---------------+---------+-----------+----------+--------------+ POP      Full           Yes      Yes                                 +---------+---------------+---------+-----------+----------+--------------+ PTV      Full                                                        +---------+---------------+---------+-----------+----------+--------------+ PERO     Full                                                        +---------+---------------+---------+-----------+----------+--------------+    Summary: RIGHT: - No evidence of common femoral vein obstruction.  LEFT: - There is no evidence of deep vein thrombosis in the lower extremity.  - No cystic structure found in the popliteal fossa.  - Ultrasound characteristics of enlarged lymph nodes noted in the groin.  *See table(s) above for measurements and observations. Electronically signed by Harold Barban MD on 04/18/2022 at 8:15:29 PM.  Final    MR Brain W and Wo Contrast  Result Date: 04/18/2022 CLINICAL DATA:  Brain neoplasm.  Altered mental status. EXAM: MRI HEAD WITHOUT AND WITH CONTRAST TECHNIQUE: Multiplanar, multiecho pulse sequences of the brain and surrounding  structures were obtained without and with intravenous contrast. CONTRAST:  61mL GADAVIST GADOBUTROL 1 MMOL/ML IV SOLN COMPARISON:  02/28/2022 FINDINGS: Brain: No acute infarct, mass effect or extra-axial collection. No chronic microhemorrhage or siderosis. Confluent hyperintense T2-weighted signal within the right hemispheric white matter has progressed. Encephalomalacia of the anterior right temporal lobe. Unchanged nodular focus of contrast enhancement at the lateral aspect of the inferior right temporal lobe measures 11 x 10 mm, unchanged (series 16, image 55). No new site of abnormal contrast enhancement. Vascular: Normal flow voids. Skull and upper cervical spine: Normal marrow signal. Sinuses/Orbits: Negative. Other: None. IMPRESSION: 1. Unchanged size of 11 x 10 mm anterior right temporal lobe lesion 2. No new site of abnormal contrast enhancement. 3. Progression of right hemispheric white matter hyperintense T2-weighted signal, which may be radiation treatment related. Electronically Signed   By: Ulyses Jarred M.D.   On: 04/18/2022 19:36   CT Head Wo Contrast  Result Date: 04/18/2022 CLINICAL DATA:  Provided history: Mental status change, unknown cause. EXAM: CT HEAD WITHOUT CONTRAST TECHNIQUE: Contiguous axial images were obtained from the base of the skull through the vertex without intravenous contrast. RADIATION DOSE REDUCTION: This exam was performed according to the departmental dose-optimization program which includes automated exposure control, adjustment of the mA and/or kV according to patient size and/or use of iterative reconstruction technique. COMPARISON:  Head CT 03/25/2022. Prior brain MRI examinations 02/28/2022 and earlier. FINDINGS: Brain: Mild generalized cerebral atrophy. Redemonstrated postoperative changes within the right temporal lobe from prior mass resection. There is inadequate reassessment for tumor progression on this non-contrast head CT. However, no progressive mass effect  is noted at the resection site. Ill-defined hypoattenuation within the white matter adjacent to the resection site, and within the right frontoparietal lobes, which may reflect treatment related changes and/or infiltrative tumor. There is no acute intracranial hemorrhage. No acute demarcated cortical infarct. No extra-axial fluid collection. No midline shift. Vascular: No hyperdense vessel. Atherosclerotic calcifications. Skull: Right temporoparietal cranioplasty. Sinuses/Orbits: No orbital mass or acute orbital finding. No significant paranasal sinus disease at the imaged levels. IMPRESSION: 1. No evidence of acute intracranial hemorrhage or acute infarct. 2. Redemonstrated postoperative changes within the right temporal lobe from prior mass resection. There is inadequate reassessment for tumor progression on this non-contrast head CT. However, no progressive mass effect is noted at the resection site. A brain MRI (without and with contrast) may be obtained for further evaluation, as clinically warranted. 3. Ill-defined hypoattenuation within the white matter surrounding the resection site, and within the right frontoparietal lobes, which may reflect treatment related changes and/or infiltrative tumor. 4. Mild generalized cerebral atrophy. Electronically Signed   By: Kellie Simmering D.O.   On: 04/18/2022 16:19   DG Chest Portable 1 View  Result Date: 04/18/2022 CLINICAL DATA:  Altered mental status.  Increased confusion EXAM: PORTABLE CHEST 1 VIEW COMPARISON:  03/24/2022 FINDINGS: The patient is rotated to the left on today's radiograph, reducing diagnostic sensitivity and specificity. Large hiatal hernia containing stomach and colon. This obscures the cardiac contour on the right and is associated with passive atelectasis. There is previously airspace opacity along the lingula and/or left lower lobe, this is no longer present. No blunting of the costophrenic angles. Atherosclerotic calcification of the aortic  arch. IMPRESSION: 1. Large hiatal hernia containing stomach and colon. This obscures the cardiac contour on the right and is associated with passive atelectasis. 2. No acute findings. Prior left basilar airspace opacity has cleared. 3.  Aortic Atherosclerosis (ICD10-I70.0). Electronically Signed   By: Van Clines M.D.   On: 04/18/2022 13:25    Procedures Procedures    Medications Ordered in ED Medications  sodium chloride 0.9 % bolus 500 mL (0 mLs Intravenous Stopped 04/18/22 1424)  acetaminophen (TYLENOL) tablet 650 mg (650 mg Oral Given 04/18/22 1452)  gadobutrol (GADAVIST) 1 MMOL/ML injection 9 mL (9 mLs Intravenous Contrast Given 04/18/22 1851)    ED Course/ Medical Decision Making/ A&P Clinical Course as of 04/18/22 2140  Fri Apr 18, 2022  1733 Consult with Neuro-oncologist, Dr. Mickeal Skinner who recommends MRI brain w and wo. He notes that he spoke with the patient last week regarding her mental status change and recommended evaluation, however, patient presented today. [SB]  1806 Discussed with patient and family lab and imaging results. Answered all available questions. Daughter notes pt had a phone call with Dr. Mickeal Skinner with concerns for confusion and UTI.  [SB]    Clinical Course User Index [SB] Graciela Plato A, PA-C                             Medical Decision Making Amount and/or Complexity of Data Reviewed Labs: ordered. Radiology: ordered.  Risk OTC drugs. Prescription drug management. Decision regarding hospitalization.   Pt presents with concerns for AMS. Vital signs, pt afebrile. On exam, pt with no chest wall or abdominal tenderness to palpation.  Tenderness to palpation noted to thoracic spine.  No tenderness to palpation noted to the remainder of the spine.  Tenderness to palpation noted to left lower extremity. No acute cardiovascular, respiratory, abdominal exam findings. Differential diagnosis includes anemia, hypoglycemia, intracranial abnormality, electrolyte  abnormality, PNA, acute cystitis, arrhythmia, CVA, TIA.    Co morbidities that complicate the patient evaluation: Glioblastoma Left leg weakness Metastatic cancer to spine  Labs:  I ordered, and personally interpreted labs.  The pertinent results include:   Urinalysis with a moderate amount of hemoglobin CBG at 122 CBC without leukocytosis, hemoglobin downtrending at 10.5, platelets downtrending at 101 (patient just had chemotherapy on 04/11/2022) CMP overall unremarkable  Imaging: I ordered imaging studies including CT head, CXR, MRI, DVT US I independently visualized and interpreted imaging which showed: MRI with  1. Unchanged size of 11 x 10 mm anterior right temporal lobe lesion  2. No new site of abnormal contrast enhancement.  3. Progression of right hemispheric white matter hyperintense  T2-weighted signal, which may be radiation treatment related.   US DVT study negative MRI  1. Unchanged size of 11 x 10 mm anterior right temporal lobe lesion  2. No new site of abnormal contrast enhancement.  3. Progression of right hemispheric white matter hyperintense  T2-weighted signal, which may be radiation treatment related.   I agree with the radiologist interpretation  Medications:  I ordered medication including IVF for symptom management I have reviewed the patients home medicines and have made adjustments as needed    Consultations: Attending discussed with hospitalist Dr. Marlowe Sax And discussed lab and imaging findings as well as pertinent plan - they recommend: will evaluate for admission  Disposition: Presentation suspicious for altered mental status.  Low suspicion for acute cystitis, pneumonia, electrolyte abnormality, CVA, TIA at this time. After consideration of the diagnostic  results and the patients response to treatment, I feel that the patient would benefit from Admission to the hospital.  Discussed with patient and family plans for admission.  Patient and family  agreeable to admission at this time.  Patient present for admission at this time.   This chart was dictated using voice recognition software, Dragon. Despite the best efforts of this provider to proofread and correct errors, errors may still occur which can change documentation meaning.   Final Clinical Impression(s) / ED Diagnoses Final diagnoses:  Altered mental status, unspecified altered mental status type    Rx / DC Orders ED Discharge Orders     None         Matisse Salais A, PA-C 04/18/22 2142    Teressa Lower, MD 04/20/22 1317

## 2022-04-18 NOTE — ED Notes (Signed)
Pt to CT via stretcher

## 2022-04-18 NOTE — ED Notes (Signed)
Pain assessed, pt requesting a small dose of pain medication and requests to not have anything that will make her feel more altered. [place], [place] notified

## 2022-04-18 NOTE — ED Triage Notes (Signed)
BIB EMS from home for increased confusion over the last week. Oncologist wanted pt checked out due to the ongoing confusion. Pt is alert on arrival. Pt has been intermittently disorientedx4 and has had difficulty ambulating. Pt has no motor function in left leg and has significant left arm weakness as of 3 weeks ago. Currently being treated for UTI, last dose tomorrow.  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

## 2022-04-18 NOTE — Progress Notes (Signed)
Lower extremity venous left study completed.  Preliminary results relayed to Blue, PA.  See CV Proc for preliminary results report.   Anjannette Gauger, RDMS, RVT  

## 2022-04-18 NOTE — ED Notes (Signed)
Pt to MRI

## 2022-04-18 NOTE — ED Notes (Signed)
Pt requesting pain medication. Hospitalist notified

## 2022-04-19 DIAGNOSIS — G9340 Encephalopathy, unspecified: Secondary | ICD-10-CM | POA: Diagnosis not present

## 2022-04-19 LAB — CBC
HCT: 31.6 % — ABNORMAL LOW (ref 36.0–46.0)
Hemoglobin: 10.7 g/dL — ABNORMAL LOW (ref 12.0–15.0)
MCH: 35.8 pg — ABNORMAL HIGH (ref 26.0–34.0)
MCHC: 33.9 g/dL (ref 30.0–36.0)
MCV: 105.7 fL — ABNORMAL HIGH (ref 80.0–100.0)
Platelets: 86 10*3/uL — ABNORMAL LOW (ref 150–400)
RBC: 2.99 MIL/uL — ABNORMAL LOW (ref 3.87–5.11)
RDW: 14.4 % (ref 11.5–15.5)
WBC: 4.7 10*3/uL (ref 4.0–10.5)
nRBC: 0 % (ref 0.0–0.2)

## 2022-04-19 MED ORDER — IBUPROFEN 200 MG PO TABS
600.0000 mg | ORAL_TABLET | Freq: Once | ORAL | Status: AC
  Administered 2022-04-19: 600 mg via ORAL
  Filled 2022-04-19: qty 3

## 2022-04-19 MED ORDER — TRAZODONE HCL 50 MG PO TABS: 50.0000 mg | ORAL_TABLET | Freq: Every evening | ORAL | 1 refills | Status: DC | PRN

## 2022-04-19 MED ORDER — CHLORHEXIDINE GLUCONATE CLOTH 2 % EX PADS
6.0000 | MEDICATED_PAD | Freq: Every day | CUTANEOUS | Status: DC
  Administered 2022-04-19: 6 via TOPICAL

## 2022-04-19 MED ORDER — OXYCODONE HCL 5 MG PO TABS
5.0000 mg | ORAL_TABLET | Freq: Once | ORAL | Status: AC
  Administered 2022-04-19: 5 mg via ORAL
  Filled 2022-04-19: qty 1

## 2022-04-19 MED ORDER — GABAPENTIN 300 MG PO CAPS
300.0000 mg | ORAL_CAPSULE | Freq: Three times a day (TID) | ORAL | Status: DC
  Administered 2022-04-19: 300 mg via ORAL
  Filled 2022-04-19: qty 1

## 2022-04-19 NOTE — Discharge Summary (Signed)
Physician Discharge Summary   Kelly [name] [id number] DOB: 01/22/1965 DOA: 04/18/2022  PCP: [facility]  Admit date: 04/18/2022 Discharge date: 04/19/2022  Admitted From: Home Disposition:  Home Discharging physician: Dwyane Dee, MD Barriers to discharge: none  Recommendations for Outpatient Follow-up:  Continue outpt management with Dr. Mickeal Skinner  Discharge Condition: stable CODE STATUS: DNR Diet recommendation:  Diet Orders (From admission, onward)     Start     Ordered   04/19/22 0000  Diet - low sodium heart healthy        04/19/22 1111   04/18/22 2213  Diet Heart Room service appropriate? Yes; Fluid consistency: Thin  Diet effective now       Question Answer Comment  Room service appropriate? 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 Of note, patient had also recently been started on gabapentin within the last several weeks. She had endorsed some very mild tremors in her left upper extremity. She was admitted for further workup involving possible etiology of her confusion.  There was no witnessed seizure activity at home prior to admission and she has been stable on her antiepileptic regimen. CT head and MRI brain were obtained.  MRI showed unchanged size of 11 x 10 mm anterior right temporal lobe lesion.  No new sites of abnormal contrast-enhancement. Urinalysis was negative for signs of infection but she was still completing a course of antibiotics outpatient from a recent diagnosis of UTI. CXR was also negative for acute findings notably any focal infiltrates or edema. She was afebrile with no leukocytosis.  She was started on IV fluids and monitored  overnight.  Mentation returned to normal baseline and confirmed by family present bedside. She was recommended to proceed with caution with resuming her multiple psychotropic medications at home as well as ongoing sedating medications including oxycodone and gabapentin.  Her presenting symptoms may be cumulative effect of her polypharmacy.   The patient's chronic medical conditions were treated accordingly per the patient's home medication regimen except as noted.  On day of discharge, patient was felt deemed stable for discharge. Patient/family member advised to call PCP or come back to ER if needed.   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   Anemia 04/18/2022   Thrombocytopenia ([place]) 04/18/2022   Essential hypertension 07/31/2015   HLD (hyperlipidemia) 07/31/2015   Hypothyroidism 07/31/2015   Adjustment disorder with depressed mood 07/31/2015    Resolved Hospital Problems   Diagnosis Date Noted Date Resolved   Acute encephalopathy 04/18/2022 04/19/2022    Priority: 1.     Discharge Instructions     Diet - low sodium heart healthy   Complete by: As directed    Increase activity slowly   Complete by: As directed       Allergies as of 04/19/2022       Reactions   Ranitidine Anaphylaxis        Medication List     TAKE these medications    acetaminophen 325 MG tablet Commonly known as: TYLENOL Take 1-2 tablets (325-650 mg total)  by mouth every 4 (four) hours as needed for mild pain.   amitriptyline 100 MG tablet Commonly known as: ELAVIL Take 2 tablets (200 mg total) by mouth at bedtime.   atorvastatin 10 MG tablet Commonly known as: LIPITOR Take 10 mg by mouth at bedtime.   Calcium Antacid 500 MG chewable tablet Generic drug: calcium carbonate Chew 2 tablets (400 mg of elemental calcium total) by mouth 3 (three) times daily.    cyclobenzaprine 5 MG tablet Commonly known as: FLEXERIL Take 1-2 tablets (5-10 mg total) by mouth at bedtime.   dexamethasone 1 MG tablet Commonly known as: DECADRON Take 3 tablets (3 mg total) by mouth daily with breakfast for 7 days, THEN 2 tablets (2 mg total) daily with breakfast for 7 days, THEN 1 tablet (1 mg total) daily with breakfast for 7 days. Start taking on: April 08, 2022   diclofenac Sodium 1 % Gel Commonly known as: VOLTAREN Apply 2 g topically 4 (four) times daily.   enoxaparin 40 MG/0.4ML injection Commonly known as: LOVENOX Inject 0.4 mLs (40 mg total) into the skin daily for 28 days.   escitalopram 10 MG tablet Commonly known as: LEXAPRO Take 10 mg by mouth at bedtime.   gabapentin 300 MG capsule Commonly known as: NEURONTIN Take 1 capsule (300 mg total) by mouth 3 (three) times daily.   ibuprofen 400 MG tablet Commonly known as: ADVIL Take 1 tablet (400 mg total) by mouth every 6 (six) hours as needed for mild pain or headache.   Lacosamide 100 MG Tabs Take 1 tablet (100 mg total) by mouth 2 (two) times daily.   levETIRAcetam 750 MG tablet Commonly known as: KEPPRA TAKE 2 TABLETS (1,500 MG TOTAL) BY MOUTH 2 (TWO) TIMES DAILY.   lisinopril 5 MG tablet Commonly known as: ZESTRIL Take 1 tablet (5 mg total) by mouth at bedtime.   melatonin 5 MG Tabs Take 2 tablets (10 mg total) by mouth at bedtime.   ondansetron 8 MG tablet Commonly known as: ZOFRAN TAKE 1 TABLET (8 MG TOTAL) BY MOUTH 2 (TWO) TIMES DAILY AS NEEDED (NAUSEA AND VOMITING). MAY TAKE 30-60 MINUTES PRIOR TO TEMODAR ADMINISTRATION IF NAUSEA/VOMITING OCCURS.   oxyCODONE 5 MG immediate release tablet Commonly known as: Oxy IR/ROXICODONE Take 1-2 tablets (5-10 mg total) by mouth every 4 (four) hours as needed for breakthrough pain.   pantoprazole 40 MG tablet Commonly known as: PROTONIX Take 1 tablet (40 mg total) by mouth 2 (two) times daily.   polyethylene glycol powder 17 GM/SCOOP  powder Commonly known as: GLYCOLAX/MIRALAX Take 1 capful (17 g) by mouth daily as needed for mild constipation.   prochlorperazine 10 MG tablet Commonly known as: COMPAZINE Take 1 tablet (10 mg total) by mouth every 6 (six) hours as needed for nausea or vomiting.   promethazine 25 MG tablet Commonly known as: PHENERGAN TAKE 1 TABLET BY MOUTH EVERY 4 HOURS AS NEEDED.   Senexon-S 8.6-50 MG tablet Generic drug: senna-docusate Take 2 tablets by mouth at bedtime.   sodium chloride 1 g tablet Take 1 tablet (1 g total) by mouth 2 (two) times daily with a meal.   tamsulosin 0.4 MG Caps capsule Commonly known as: FLOMAX Take 1 capsule (0.4 mg total) by mouth daily after supper.   temozolomide 100 MG capsule Commonly known as: TEMODAR Take 4 capsules (400 mg total) by mouth daily. May take on an empty stomach to decrease nausea & vomiting.   traZODone 50 MG tablet Commonly known as: DESYREL  Take 1 tablet (50 mg total) by mouth at bedtime as needed for sleep.        Allergies  Allergen Reactions   Ranitidine Anaphylaxis    Consultations:   Procedures:   Discharge Exam: BP 137/82 (BP Location: Right Arm)   Pulse (!) 115   Temp (!) 97.5 F (36.4 C) (Oral)   Resp 20   Ht 5\' 4"  (1.626 m)   Wt 90.3 kg   SpO2 98%   BMI 34.17 kg/m  Physical Exam Constitutional:      General: She is not in acute distress.    Appearance: Normal appearance.  HENT:     Head: Normocephalic and atraumatic.     Mouth/Throat:     Mouth: Mucous membranes are moist.  Eyes:     Extraocular Movements: Extraocular movements intact.  Cardiovascular:     Rate and Rhythm: Normal rate and regular rhythm.  Pulmonary:     Effort: Pulmonary effort is normal.     Breath sounds: Normal breath sounds.  Abdominal:     General: Bowel sounds are normal. There is no distension.     Palpations: Abdomen is soft.     Tenderness: There is no abdominal tenderness.  Musculoskeletal:        General: No  swelling.     Cervical back: Normal range of motion and neck supple.  Skin:    General: Skin is warm and dry.  Neurological:     Mental Status: She is alert.     Comments: 0/5 strength in LLE; foot drop boot in place; LLE noted with paresthesia   Psychiatric:        Mood and Affect: Mood normal.      The results of significant diagnostics from this hospitalization (including imaging, microbiology, ancillary and laboratory) are listed below for reference.   Microbiology: No results found for this or any previous visit (from the past 240 hour(s)).   Labs: BNP (last 3 results) Recent Labs    03/25/22 1032  BNP 0000000   Basic Metabolic Panel: Recent Labs  Lab 04/18/22 1235  NA 135  K 3.7  CL 101  CO2 24  GLUCOSE 123*  BUN 18  CREATININE 0.41*  CALCIUM 8.5*   Liver Function Tests: Recent Labs  Lab 04/18/22 1235  AST 14*  ALT 31  ALKPHOS 84  BILITOT 0.8  PROT 6.2*  ALBUMIN 2.9*   No results for input(s): "LIPASE", "AMYLASE" in the last 168 hours. Recent Labs  Lab 04/18/22 2142  AMMONIA 17   CBC: Recent Labs  Lab 04/18/22 1235 04/19/22 0604  WBC 5.1 4.7  HGB 10.5* 10.7*  HCT 29.5* 31.6*  MCV 105.4* 105.7*  PLT 101* 86*   Cardiac Enzymes: No results for input(s): "CKTOTAL", "CKMB", "CKMBINDEX", "TROPONINI" in the last 168 hours. BNP: Invalid input(s): "POCBNP" CBG: Recent Labs  Lab 04/18/22 1232  GLUCAP 122*   D-Dimer No results for input(s): "DDIMER" in the last 72 hours. Hgb A1c No results for input(s): "HGBA1C" in the last 72 hours. Lipid Profile No results for input(s): "CHOL", "HDL", "LDLCALC", "TRIG", "CHOLHDL", "LDLDIRECT" in the last 72 hours. Thyroid function studies No results for input(s): "TSH", "T4TOTAL", "T3FREE", "THYROIDAB" in the last 72 hours.  Invalid input(s): "FREET3" Anemia work up No results for input(s): "VITAMINB12", "FOLATE", "FERRITIN", "TIBC", "IRON", "RETICCTPCT" in the last 72 hours. Urinalysis    Component  Value Date/Time   COLORURINE YELLOW 04/18/2022 1233   APPEARANCEUR CLEAR 04/18/2022 1233   LABSPEC 1.008  04/18/2022 1233   PHURINE 5.0 04/18/2022 1233   GLUCOSEU NEGATIVE 04/18/2022 1233   HGBUR MODERATE (A) 04/18/2022 1233   BILIRUBINUR NEGATIVE 04/18/2022 1233   KETONESUR NEGATIVE 04/18/2022 1233   PROTEINUR NEGATIVE 04/18/2022 1233   UROBILINOGEN 0.2 07/31/2009 0959   NITRITE NEGATIVE 04/18/2022 1233   LEUKOCYTESUR NEGATIVE 04/18/2022 1233   Sepsis Labs Recent Labs  Lab 04/18/22 1235 04/19/22 0604  WBC 5.1 4.7   Microbiology No results found for this or any previous visit (from the past 240 hour(s)).  Procedures/Studies: VAS [place] LOWER EXTREMITY VENOUS (DVT) (7a-7p)  Result Date: 04/18/2022  Lower Venous DVT Study Patient Name:  DERI DEGUIRE  Date of Exam:   04/18/2022 Medical Rec #: [id number]       Accession #:    [id number] Date of Birth: 02/01/1965      Patient Gender: F Patient Age:   41 years Exam Location:  [facility] Procedure:      VAS [place] LOWER EXTREMITY VENOUS (DVT) Referring Phys: Kennedy --------------------------------------------------------------------------------  Indications: Left lower extremity pain- posterior knee/distal thigh and anterior ankle.  Risk Factors: Cancer Cancer. Comparison Study: 03-07-2022 Prior left lower extremity venous duplex was                   negative for DVT. Performing Technologist: Darlin Coco RDMS, RVT  Examination Guidelines: A complete evaluation includes B-mode imaging, spectral Doppler, color Doppler, and power Doppler as needed of all accessible portions of each vessel. Bilateral testing is considered an integral part of a complete examination. Limited examinations for reoccurring indications may be performed as noted. The reflux portion of the exam is performed with the patient in reverse Trendelenburg.  +-----+---------------+---------+-----------+----------+--------------+  RIGHTCompressibilityPhasicitySpontaneityPropertiesThrombus Aging +-----+---------------+---------+-----------+----------+--------------+ CFV  Full           Yes      Yes                                 +-----+---------------+---------+-----------+----------+--------------+   +---------+---------------+---------+-----------+----------+--------------+ LEFT     CompressibilityPhasicitySpontaneityPropertiesThrombus Aging +---------+---------------+---------+-----------+----------+--------------+ CFV      Full           Yes      Yes                                 +---------+---------------+---------+-----------+----------+--------------+ SFJ      Full                                                        +---------+---------------+---------+-----------+----------+--------------+ FV Prox  Full                                                        +---------+---------------+---------+-----------+----------+--------------+ FV Mid   Full                                                        +---------+---------------+---------+-----------+----------+--------------+  FV DistalFull                                                        +---------+---------------+---------+-----------+----------+--------------+ PFV      Full                                                        +---------+---------------+---------+-----------+----------+--------------+ POP      Full           Yes      Yes                                 +---------+---------------+---------+-----------+----------+--------------+ PTV      Full                                                        +---------+---------------+---------+-----------+----------+--------------+ PERO     Full                                                        +---------+---------------+---------+-----------+----------+--------------+    Summary: RIGHT: - No evidence of common femoral vein  obstruction.  LEFT: - There is no evidence of deep vein thrombosis in the lower extremity.  - No cystic structure found in the popliteal fossa.  - Ultrasound characteristics of enlarged lymph nodes noted in the groin.  *See table(s) above for measurements and observations. Electronically signed by Harold Barban MD on 04/18/2022 at 8:15:29 PM.    Final    MR Brain W and Wo Contrast  Result Date: 04/18/2022 CLINICAL DATA:  Brain neoplasm.  Altered mental status. EXAM: MRI HEAD WITHOUT AND WITH CONTRAST TECHNIQUE: Multiplanar, multiecho pulse sequences of the brain and surrounding structures were obtained without and with intravenous contrast. CONTRAST:  2mL GADAVIST GADOBUTROL 1 MMOL/ML IV SOLN COMPARISON:  02/28/2022 FINDINGS: Brain: No acute infarct, mass effect or extra-axial collection. No chronic microhemorrhage or siderosis. Confluent hyperintense T2-weighted signal within the right hemispheric white matter has progressed. Encephalomalacia of the anterior right temporal lobe. Unchanged nodular focus of contrast enhancement at the lateral aspect of the inferior right temporal lobe measures 11 x 10 mm, unchanged (series 16, image 55). No new site of abnormal contrast enhancement. Vascular: Normal flow voids. Skull and upper cervical spine: Normal marrow signal. Sinuses/Orbits: Negative. Other: None. IMPRESSION: 1. Unchanged size of 11 x 10 mm anterior right temporal lobe lesion 2. No new site of abnormal contrast enhancement. 3. Progression of right hemispheric white matter hyperintense T2-weighted signal, which may be radiation treatment related. Electronically Signed   By: Ulyses Jarred M.D.   On: 04/18/2022 19:36   CT Head Wo Contrast  Result Date: 04/18/2022 CLINICAL DATA:  Provided history: Mental status change, unknown cause. EXAM: CT HEAD WITHOUT CONTRAST TECHNIQUE: Contiguous axial images  were obtained from the base of the skull through the vertex without intravenous contrast. RADIATION DOSE  REDUCTION: This exam was performed according to the departmental dose-optimization program which includes automated exposure control, adjustment of the mA and/or kV according to patient size and/or use of iterative reconstruction technique. COMPARISON:  Head CT 03/25/2022. Prior brain MRI examinations 02/28/2022 and earlier. FINDINGS: Brain: Mild generalized cerebral atrophy. Redemonstrated postoperative changes within the right temporal lobe from prior mass resection. There is inadequate reassessment for tumor progression on this non-contrast head CT. However, no progressive mass effect is noted at the resection site. Ill-defined hypoattenuation within the white matter adjacent to the resection site, and within the right frontoparietal lobes, which may reflect treatment related changes and/or infiltrative tumor. There is no acute intracranial hemorrhage. No acute demarcated cortical infarct. No extra-axial fluid collection. No midline shift. Vascular: No hyperdense vessel. Atherosclerotic calcifications. Skull: Right temporoparietal cranioplasty. Sinuses/Orbits: No orbital mass or acute orbital finding. No significant paranasal sinus disease at the imaged levels. IMPRESSION: 1. No evidence of acute intracranial hemorrhage or acute infarct. 2. Redemonstrated postoperative changes within the right temporal lobe from prior mass resection. There is inadequate reassessment for tumor progression on this non-contrast head CT. However, no progressive mass effect is noted at the resection site. A brain MRI (without and with contrast) may be obtained for further evaluation, as clinically warranted. 3. Ill-defined hypoattenuation within the white matter surrounding the resection site, and within the right frontoparietal lobes, which may reflect treatment related changes and/or infiltrative tumor. 4. Mild generalized cerebral atrophy. Electronically Signed   By: Kellie Simmering D.O.   On: 04/18/2022 16:19   DG Chest Portable 1  View  Result Date: 04/18/2022 CLINICAL DATA:  Altered mental status.  Increased confusion EXAM: PORTABLE CHEST 1 VIEW COMPARISON:  03/24/2022 FINDINGS: The patient is rotated to the left on today's radiograph, reducing diagnostic sensitivity and specificity. Large hiatal hernia containing stomach and colon. This obscures the cardiac contour on the right and is associated with passive atelectasis. There is previously airspace opacity along the lingula and/or left lower lobe, this is no longer present. No blunting of the costophrenic angles. Atherosclerotic calcification of the aortic arch. IMPRESSION: 1. Large hiatal hernia containing stomach and colon. This obscures the cardiac contour on the right and is associated with passive atelectasis. 2. No acute findings. Prior left basilar airspace opacity has cleared. 3.  Aortic Atherosclerosis (ICD10-I70.0). Electronically Signed   By: Van Clines M.D.   On: 04/18/2022 13:25   CT THORACIC SPINE WO CONTRAST  Result Date: 03/25/2022 CLINICAL DATA:  Metastatic glioblastoma to the thoracic spinal cord. Incomplete paraplegia with worsening right leg weakness. EXAM: CT THORACIC AND LUMBAR SPINE WITHOUT CONTRAST TECHNIQUE: Multidetector CT imaging of the thoracic and lumbar spine was performed without contrast. Multiplanar CT image reconstructions were also generated. RADIATION DOSE REDUCTION: This exam was performed according to the departmental dose-optimization program which includes automated exposure control, adjustment of the mA and/or kV according to patient size and/or use of iterative reconstruction technique. COMPARISON:  MR total spine dated March 07, 2022. FINDINGS: CT THORACIC SPINE FINDINGS Alignment: Normal. Vertebrae: No acute fracture or focal pathologic process. Paraspinal and other soft tissues: Scattered ground-glass densities and peribronchovascular ground-glass consolidation in both lungs with smooth interlobular septal thickening. Unchanged  very large hiatal hernia containing stomach and colon. Disc levels: Focal round hyperdensity in the spinal canal at T6 measuring 1.3 x 1.1 cm, corresponding to the metastasis seen better on recent MRI. No  significant disc bulge or herniation. CT LUMBAR SPINE FINDINGS Segmentation: 5 lumbar type vertebrae. Alignment: Normal. Vertebrae: No acute fracture or focal pathologic process. Paraspinal and other soft tissues: Aortoiliac atherosclerotic vascular disease. Sigmoid colonic diverticulosis. Disc levels: Mild disc bulging from L1-L2 through L4-L5. Lower lumbar facet arthropathy, moderate on the right at L5-S1. No spinal canal or neuroforaminal stenosis at any level. IMPRESSION: 1. Focal round hyperdensity in the spinal canal at T6 measuring 1.3 x 1.1 cm, corresponding to the metastasis seen better on recent MRI. 2. No acute osseous abnormality or significant degenerative changes in the thoracic or lumbar spine. 3. Scattered ground-glass densities and peribronchovascular ground-glass consolidation in both lungs, concerning for multifocal pneumonia. 4. Unchanged very large hiatal hernia containing stomach and colon. 5.  Aortic Atherosclerosis (ICD10-I70.0). Electronically Signed   By: Titus Dubin M.D.   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 EXAM: CT THORACIC AND LUMBAR SPINE WITHOUT CONTRAST TECHNIQUE: Multidetector CT imaging of the thoracic and lumbar spine was performed without contrast. Multiplanar CT image reconstructions were also generated. RADIATION DOSE REDUCTION: This exam was performed according to the departmental dose-optimization program which includes automated exposure control, adjustment of the mA and/or kV according to patient size and/or use of iterative reconstruction technique. COMPARISON:  MR total spine dated March 07, 2022.  FINDINGS: CT THORACIC SPINE FINDINGS Alignment: Normal. Vertebrae: No acute fracture or focal pathologic process. Paraspinal and other soft tissues: Scattered ground-glass densities and peribronchovascular ground-glass consolidation in both lungs with smooth interlobular septal thickening. Unchanged very large hiatal hernia containing stomach and colon. Disc levels: Focal round hyperdensity in the spinal canal at T6 measuring 1.3 x 1.1 cm, corresponding to the metastasis seen better on recent MRI. No significant disc bulge or herniation. CT LUMBAR SPINE FINDINGS Segmentation: 5 lumbar type vertebrae. Alignment: Normal. Vertebrae: No acute fracture or focal pathologic process. Paraspinal and other soft tissues: Aortoiliac atherosclerotic vascular disease. Sigmoid colonic diverticulosis. Disc levels: Mild disc bulging from L1-L2 through L4-L5. Lower lumbar facet arthropathy, moderate on the right at L5-S1. No spinal canal or neuroforaminal stenosis at any level. IMPRESSION: 1. Focal round hyperdensity in the spinal canal at T6 measuring 1.3 x 1.1 cm, corresponding to the metastasis seen better on recent MRI. 2. No acute osseous abnormality or significant degenerative changes in the thoracic or lumbar spine. 3. Scattered ground-glass densities and peribronchovascular ground-glass consolidation in both lungs, concerning for multifocal pneumonia. 4. Unchanged very large hiatal hernia containing stomach and colon. 5.  Aortic Atherosclerosis (ICD10-I70.0). Electronically Signed   By: Titus Dubin M.D.   On: 03/25/2022 18:15   CT HEAD WO CONTRAST (5MM)  Result Date: 03/25/2022 CLINICAL DATA:  Slurred speech.  GBM. EXAM: CT HEAD WITHOUT CONTRAST CT CERVICAL SPINE WITHOUT CONTRAST TECHNIQUE: Multidetector CT imaging of the head and cervical spine was performed following the standard protocol without intravenous contrast. Multiplanar CT image reconstructions of the cervical spine were also generated. RADIATION DOSE  REDUCTION: This exam was performed according to the departmental dose-optimization program which includes automated exposure control, adjustment of the mA and/or kV according to patient size and/or use of iterative reconstruction technique. COMPARISON:  Head CT dated 03/01/2021. FINDINGS: CT HEAD FINDINGS Brain: Postoperative changes of the right temporal lobe with encephalomalacia. There is mild periventricular and deep white matter chronic microvascular ischemic changes. There is no acute intracranial hemorrhage. No mass  effect or midline shift. No extra-axial fluid collection. Vascular: No hyperdense vessel or unexpected calcification. Skull: Right temporal craniotomy.  No acute calvarial pathology. Sinuses/Orbits: No acute finding. Other: None CT CERVICAL SPINE FINDINGS Alignment: No acute subluxation. There is straightening of normal cervical lordosis which may be positional or due to muscle spasm. Skull base and vertebrae: No acute fracture. Soft tissues and spinal canal: No prevertebral fluid or swelling. No visible canal hematoma. Disc levels:  No acute findings.  Degenerative changes. Upper chest: Patchy bilateral airspace densities concerning for pneumonia, possibly atypical in etiology. Clinical correlation is recommended. Other: Bilateral carotid bulb calcified plaques. IMPRESSION: 1. No acute intracranial pathology. 2. Postoperative changes of the right temporal lobe with encephalomalacia. 3. No acute/traumatic cervical spine pathology. 4. Patchy bilateral airspace densities concerning for pneumonia, possibly atypical in etiology. Electronically Signed   By: Anner Crete M.D.   On: 03/25/2022 18:03   CT CERVICAL SPINE WO CONTRAST  Result Date: 03/25/2022 CLINICAL DATA:  Slurred speech.  GBM. EXAM: CT HEAD WITHOUT CONTRAST CT CERVICAL SPINE WITHOUT CONTRAST TECHNIQUE: Multidetector CT imaging of the head and cervical spine was performed following the standard protocol without intravenous contrast.  Multiplanar CT image reconstructions of the cervical spine were also generated. RADIATION DOSE REDUCTION: This exam was performed according to the departmental dose-optimization program which includes automated exposure control, adjustment of the mA and/or kV according to patient size and/or use of iterative reconstruction technique. COMPARISON:  Head CT dated 03/01/2021. FINDINGS: CT HEAD FINDINGS Brain: Postoperative changes of the right temporal lobe with encephalomalacia. There is mild periventricular and deep white matter chronic microvascular ischemic changes. There is no acute intracranial hemorrhage. No mass effect or midline shift. No extra-axial fluid collection. Vascular: No hyperdense vessel or unexpected calcification. Skull: Right temporal craniotomy.  No acute calvarial pathology. Sinuses/Orbits: No acute finding. Other: None CT CERVICAL SPINE FINDINGS Alignment: No acute subluxation. There is straightening of normal cervical lordosis which may be positional or due to muscle spasm. Skull base and vertebrae: No acute fracture. Soft tissues and spinal canal: No prevertebral fluid or swelling. No visible canal hematoma. Disc levels:  No acute findings.  Degenerative changes. Upper chest: Patchy bilateral airspace densities concerning for pneumonia, possibly atypical in etiology. Clinical correlation is recommended. Other: Bilateral carotid bulb calcified plaques. IMPRESSION: 1. No acute intracranial pathology. 2. Postoperative changes of the right temporal lobe with encephalomalacia. 3. No acute/traumatic cervical spine pathology. 4. Patchy bilateral airspace densities concerning for pneumonia, possibly atypical in etiology. Electronically Signed   By: Anner Crete M.D.   On: 03/25/2022 18:03   DG Shoulder Left  Result Date: 03/24/2022 CLINICAL DATA:  Left shoulder pain. EXAM: LEFT SHOULDER - 2+ VIEW COMPARISON:  Chest radiograph 01/16/2021 FINDINGS: Multiple longitudinal linear lucencies within  the inferior aspect of the glenoid and adjacent scapular body appear to represent normal trabecular markings, and an acute fracture is felt less likely. Mild inferior glenoid and humeral head-neck junction degenerative spurring. Mild acromioclavicular joint space narrowing and peripheral osteophytosis. A calcified likely benign vascular phlebolith overlies the superior left lung, similar to 01/16/2021 radiograph. IMPRESSION: 1. No definite acute fracture 2. Mild acromioclavicular and glenohumeral osteoarthritis. Electronically Signed   By: Yvonne Kendall M.D.   On: 03/24/2022 14:18   DG CHEST PORT 1 VIEW  Result Date: 03/24/2022 CLINICAL DATA:  Hypoxia EXAM: PORTABLE CHEST 1 VIEW COMPARISON:  CT CAP 01/15/21, CXR 01/16/21 FINDINGS: No pleural effusion. No pneumothorax. There is a massive hiatal hernia. Compared to prior  exam there new patchy airspace opacity in the left mid and lower lung. No radiographically apparent displaced rib fractures. Cardiac contours are poorly assessed. Degenerative changes of the bilateral AC joints. IMPRESSION: New patchy airspace opacity in the left mid and lower lung. In the setting of the large hiatal hernia, these are suspicious for aspiration and/or infection. Electronically Signed   By: Marin Roberts M.D.   On: 03/24/2022 14:16     Time coordinating discharge: Over 30 minutes    Dwyane Dee, MD  [facility] 04/19/2022, 12:43 PM
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
## 2022-04-19 NOTE — Progress Notes (Signed)
  Transition of Care [facility]) Screening Note   Patient Details  Name: Cinderella Eaton Date of Birth: Mar 23, 1965   Transition [facility] Care [facility]) CM/SW Contact:    Henrietta Dine, RN Phone Number: 04/19/2022, 10:03 AM    Transition of Care Department [facility]) has reviewed patient and no TOC needs have been identified at this time. We will continue to monitor patient advancement through interdisciplinary progression rounds. If new patient transition needs arise, please place a TOC consult.

## 2022-04-19 NOTE — Hospital Course (Signed)
Ms. 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 Of note, patient had also recently been started on gabapentin within the last several weeks. She had endorsed some very mild tremors in her left upper extremity. She was admitted for further workup involving possible etiology of her confusion.  There was no witnessed seizure activity at home prior to admission and she has been stable on her antiepileptic regimen. CT head and MRI brain were obtained.  MRI showed unchanged size of 11 x 10 mm anterior right temporal lobe lesion.  No new sites of abnormal contrast-enhancement. Urinalysis was negative for signs of infection but she was still completing a course of antibiotics outpatient from a recent diagnosis of UTI. CXR was also negative for acute findings notably any focal infiltrates or edema. She was afebrile with no leukocytosis.  She was started on IV fluids and monitored overnight.  Mentation returned to normal baseline and confirmed by family present bedside. She was recommended to proceed with caution with resuming her multiple psychotropic medications at home as well as ongoing sedating medications including oxycodone and gabapentin.  Her presenting symptoms may be cumulative effect of her polypharmacy.

## 2022-04-22 ENCOUNTER — Other Ambulatory Visit: Payer: Self-pay | Admitting: *Deleted

## 2022-04-22 ENCOUNTER — Other Ambulatory Visit: Payer: Self-pay

## 2022-04-22 DIAGNOSIS — N3941 Urge incontinence: Secondary | ICD-10-CM

## 2022-04-22 DIAGNOSIS — C719 Malignant neoplasm of brain, unspecified: Secondary | ICD-10-CM

## 2022-04-22 MED ORDER — SODIUM CHLORIDE 1 G PO TABS: 1.0000 g | ORAL_TABLET | Freq: Two times a day (BID) | ORAL | 0 refills | Status: DC

## 2022-04-22 MED ORDER — MELATONIN 5 MG PO TABS: 10.0000 mg | ORAL_TABLET | Freq: Every day | ORAL | 0 refills | Status: DC

## 2022-04-22 MED ORDER — GABAPENTIN 300 MG PO CAPS: 300.0000 mg | ORAL_CAPSULE | Freq: Three times a day (TID) | ORAL | 0 refills | Status: DC

## 2022-04-22 MED ORDER — TAMSULOSIN HCL 0.4 MG PO CAPS: 0.4000 mg | ORAL_CAPSULE | Freq: Every day | ORAL | 0 refills | Status: DC

## 2022-04-24 ENCOUNTER — Telehealth: Payer: Self-pay

## 2022-04-24 NOTE — Telephone Encounter (Signed)
T/C from Jiles Harold from [facility] stating pt's family has requested a consult for Palliative Care for goals of care and symptom management.  She feel she will be transferring to Hospice very soon

## 2022-04-25 ENCOUNTER — Telehealth: Payer: Self-pay | Admitting: Internal Medicine

## 2022-04-25 NOTE — Telephone Encounter (Signed)
Telephone visit scheduled for Monday 04/28/22

## 2022-04-25 NOTE — Telephone Encounter (Signed)
Reached out to patient to schedule; voicemail full, will try again at a later time.

## 2022-04-28 ENCOUNTER — Inpatient Hospital Stay: Payer: Medicaid Other | Attending: Neurosurgery | Admitting: Internal Medicine

## 2022-04-28 DIAGNOSIS — C719 Malignant neoplasm of brain, unspecified: Secondary | ICD-10-CM

## 2022-04-28 DIAGNOSIS — G96198 Other disorders of meninges, not elsewhere classified: Secondary | ICD-10-CM

## 2022-04-28 NOTE — Progress Notes (Signed)
I connected with Kelly [name] on 04/28/22 at  4:30 PM EDT by telephone visit and verified that I am speaking with the correct person using two identifiers.  I discussed the limitations, risks, security and privacy concerns of performing an evaluation and management service by telemedicine and the availability of in-person appointments. I also discussed with the patient that there may be a patient responsible charge related to this service. The patient expressed understanding and agreed to proceed.  Other persons participating in the visit and their role in the encounter:  husband  Patient's location:  Home Provider's location:  Office  Chief Complaint:  Glioblastoma, IDH-wildtype  Leptomeningeal disease  History of Present Ilness: Kelly [name] and her husband describe continued decline in functional status.  She is bedridden full-time and will be unable to make her MRI studies tomorrow.  She is in severe pain despite frequent dosing of oxycodone.  Continuing to rely on straigh cath for urination.  Family is exhausted and overburdened with level of care at this time.  Observations: Language and cognition impaired from baseline  Assessment and Plan: Glioblastoma, IDH-wildtype  Leptomeningeal disease  Given ongoing clinical decline and poorly controlled pain, Kelly [name] and her husband express desire to transition to in home hospice care.   Will move forward with hospice of [facility] as discussed.  Follow Up Instructions: We are happy to support palliative and hospice team in any way needed moving forward  I discussed the assessment and treatment plan with the patient.  The patient was provided an opportunity to ask questions and all were answered.  The patient agreed with the plan and demonstrated understanding of the instructions.    The patient was advised to call back or seek an in-person evaluation if the symptoms worsen or if the condition fails to improve as anticipated.     Henreitta Leber, MD   I provided 22 minutes of non face-to-face telephone visit time during this encounter, and > 50% was spent counseling as documented under my assessment & plan.

## 2022-04-29 ENCOUNTER — Ambulatory Visit (HOSPITAL_COMMUNITY): Payer: Medicaid Other

## 2022-04-29 ENCOUNTER — Ambulatory Visit (HOSPITAL_COMMUNITY): Admit: 2022-04-29 | Payer: Medicaid Other

## 2022-04-29 ENCOUNTER — Other Ambulatory Visit: Payer: Self-pay | Admitting: *Deleted

## 2022-04-29 ENCOUNTER — Other Ambulatory Visit: Payer: Self-pay | Admitting: Internal Medicine

## 2022-04-29 DIAGNOSIS — C719 Malignant neoplasm of brain, unspecified: Secondary | ICD-10-CM

## 2022-05-05 ENCOUNTER — Encounter: Payer: Self-pay | Admitting: *Deleted

## 2022-05-05 ENCOUNTER — Inpatient Hospital Stay: Payer: Medicaid Other

## 2022-05-05 ENCOUNTER — Ambulatory Visit
Admission: RE | Admit: 2022-05-05 | Discharge: 2022-05-05 | Disposition: A | Discharge status: Home / Self Care | Payer: Medicaid Other | Source: Ambulatory Visit | Attending: Internal Medicine | Admitting: Internal Medicine

## 2022-05-05 DIAGNOSIS — C712 Malignant neoplasm of temporal lobe: Secondary | ICD-10-CM | POA: Insufficient documentation

## 2022-05-05 DIAGNOSIS — Z510 Encounter for antineoplastic radiation therapy: Secondary | ICD-10-CM | POA: Insufficient documentation

## 2022-05-05 NOTE — Progress Notes (Signed)
Faxed office notes to [facility]

## 2022-05-05 NOTE — Progress Notes (Addendum)
  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  Intent: Palliative  Radiation Treatment Dates: 03/12/2022 through 03/26/2022 Site Technique Total Dose (Gy) Dose per Fx (Gy) Completed Fx Beam Energies  Thoracic Spine: Spine 3D 30/30 3 10/10 10X, 15X    (as documented in provider EOT note)   The patient was not available for call today. No voicemail available.  The patient is not scheduled for ongoing care with Dr. Barbaraann Cao in medical oncology. The patient was encouraged to call if she develops concerns or questions regarding radiation. This patient has been released to palliative/ hospice for ongoing care.   Ruel Favors, LPN

## 2022-05-06 ENCOUNTER — Inpatient Hospital Stay: Payer: Medicaid Other | Admitting: Internal Medicine

## 2022-05-06 ENCOUNTER — Inpatient Hospital Stay: Payer: Medicaid Other

## 2022-05-14 ENCOUNTER — Encounter: Payer: Medicaid Other | Admitting: Physical Medicine and Rehabilitation

## 2022-05-21 DEATH — deceased
